# Patient Record
Sex: Female | Born: 1945 | Race: White | Hispanic: No | Marital: Single
Health system: Southern US, Academic
[De-identification: ages and names within clinical notes are randomized; demographics above are authoritative.]

## PROBLEM LIST (undated history)

## (undated) ENCOUNTER — Ambulatory Visit: Payer: MEDICARE

## (undated) ENCOUNTER — Ambulatory Visit

## (undated) ENCOUNTER — Encounter

## (undated) ENCOUNTER — Inpatient Hospital Stay

## (undated) ENCOUNTER — Telehealth

## (undated) DIAGNOSIS — I2510 Atherosclerotic heart disease of native coronary artery without angina pectoris: Secondary | ICD-10-CM

## (undated) DIAGNOSIS — M51369 Other intervertebral disc degeneration, lumbar region without mention of lumbar back pain or lower extremity pain: Secondary | ICD-10-CM

## (undated) DIAGNOSIS — E669 Obesity, unspecified: Secondary | ICD-10-CM

## (undated) DIAGNOSIS — Z5189 Encounter for other specified aftercare: Secondary | ICD-10-CM

## (undated) DIAGNOSIS — M1990 Unspecified osteoarthritis, unspecified site: Secondary | ICD-10-CM

## (undated) DIAGNOSIS — F32A Depression, unspecified: Secondary | ICD-10-CM

## (undated) DIAGNOSIS — M5136 Other intervertebral disc degeneration, lumbar region: Secondary | ICD-10-CM

## (undated) DIAGNOSIS — I509 Heart failure, unspecified: Secondary | ICD-10-CM

## (undated) DIAGNOSIS — N159 Renal tubulo-interstitial disease, unspecified: Secondary | ICD-10-CM

## (undated) DIAGNOSIS — J449 Chronic obstructive pulmonary disease, unspecified: Secondary | ICD-10-CM

## (undated) DIAGNOSIS — R0902 Hypoxemia: Secondary | ICD-10-CM

## (undated) DIAGNOSIS — R0600 Dyspnea, unspecified: Secondary | ICD-10-CM

## (undated) DIAGNOSIS — E119 Type 2 diabetes mellitus without complications: Secondary | ICD-10-CM

## (undated) DIAGNOSIS — I499 Cardiac arrhythmia, unspecified: Secondary | ICD-10-CM

## (undated) DIAGNOSIS — R609 Edema, unspecified: Secondary | ICD-10-CM

## (undated) DIAGNOSIS — G4730 Sleep apnea, unspecified: Secondary | ICD-10-CM

## (undated) DIAGNOSIS — Z9981 Dependence on supplemental oxygen: Secondary | ICD-10-CM

## (undated) DIAGNOSIS — F329 Major depressive disorder, single episode, unspecified: Secondary | ICD-10-CM

## (undated) DIAGNOSIS — J40 Bronchitis, not specified as acute or chronic: Secondary | ICD-10-CM

## (undated) DIAGNOSIS — C801 Malignant (primary) neoplasm, unspecified: Secondary | ICD-10-CM

## (undated) DIAGNOSIS — I10 Essential (primary) hypertension: Secondary | ICD-10-CM

## (undated) DIAGNOSIS — I209 Angina pectoris, unspecified: Secondary | ICD-10-CM

## (undated) DIAGNOSIS — B1920 Unspecified viral hepatitis C without hepatic coma: Secondary | ICD-10-CM

## (undated) DIAGNOSIS — N189 Chronic kidney disease, unspecified: Secondary | ICD-10-CM

## (undated) DIAGNOSIS — B029 Zoster without complications: Secondary | ICD-10-CM

## (undated) DIAGNOSIS — J189 Pneumonia, unspecified organism: Secondary | ICD-10-CM

## (undated) DIAGNOSIS — J45909 Unspecified asthma, uncomplicated: Secondary | ICD-10-CM

## (undated) DIAGNOSIS — D649 Anemia, unspecified: Secondary | ICD-10-CM

## (undated) DIAGNOSIS — J3089 Other allergic rhinitis: Secondary | ICD-10-CM

## (undated) HISTORY — PX: ABDOMINAL HYSTERECTOMY: SHX81

## (undated) HISTORY — DX: Chronic kidney disease, unspecified: N18.9

## (undated) HISTORY — DX: Unspecified viral hepatitis C without hepatic coma: B19.20

## (undated) HISTORY — PX: COLONOSCOPY: SHX174

## (undated) HISTORY — DX: Obesity, unspecified: E66.9

## (undated) HISTORY — DX: Heart failure, unspecified: I50.9

## (undated) HISTORY — DX: Essential (primary) hypertension: I10

## (undated) HISTORY — DX: Zoster without complications: B02.9

## (undated) HISTORY — DX: Renal tubulo-interstitial disease, unspecified: N15.9

## (undated) HISTORY — DX: Unspecified asthma, uncomplicated: J45.909

## (undated) HISTORY — DX: Anemia, unspecified: D64.9

## (undated) HISTORY — DX: Type 2 diabetes mellitus without complications: E11.9

## (undated) HISTORY — DX: Unspecified osteoarthritis, unspecified site: M19.90

## (undated) HISTORY — PX: OTHER SURGICAL HISTORY: SHX169

## (undated) HISTORY — DX: Sleep apnea, unspecified: G47.30

## (undated) HISTORY — DX: Chronic obstructive pulmonary disease, unspecified: J44.9

## (undated) HISTORY — DX: Hypoxemia: R09.02

## (undated) HISTORY — DX: Encounter for other specified aftercare: Z51.89

---
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
## 2005-08-02 HISTORY — PX: CORONARY ANGIOPLASTY WITH STENT PLACEMENT: SHX49

## 2007-08-12 ENCOUNTER — Emergency Department: Payer: Self-pay | Admitting: Internal Medicine

## 2007-08-12 ENCOUNTER — Other Ambulatory Visit: Payer: Self-pay

## 2007-09-08 ENCOUNTER — Ambulatory Visit: Payer: Self-pay | Admitting: Family Medicine

## 2008-01-21 ENCOUNTER — Ambulatory Visit: Payer: Self-pay | Admitting: Sports Medicine

## 2011-04-06 ENCOUNTER — Encounter: Payer: Self-pay | Admitting: *Deleted

## 2011-05-03 ENCOUNTER — Encounter: Payer: Self-pay | Admitting: *Deleted

## 2011-05-25 ENCOUNTER — Ambulatory Visit: Payer: Self-pay | Admitting: Family Medicine

## 2011-06-03 ENCOUNTER — Encounter: Payer: Self-pay | Admitting: *Deleted

## 2011-07-03 ENCOUNTER — Encounter: Payer: Self-pay | Admitting: *Deleted

## 2011-11-17 ENCOUNTER — Ambulatory Visit: Payer: Self-pay | Admitting: *Deleted

## 2012-06-04 ENCOUNTER — Ambulatory Visit: Payer: Self-pay | Admitting: Physician Assistant

## 2012-06-14 ENCOUNTER — Ambulatory Visit: Payer: Self-pay | Admitting: Family Medicine

## 2012-10-28 ENCOUNTER — Inpatient Hospital Stay: Payer: Self-pay | Admitting: Surgery

## 2012-10-28 LAB — CBC
HCT: 29.4 % — ABNORMAL LOW (ref 35.0–47.0)
HGB: 9.5 g/dL — ABNORMAL LOW (ref 12.0–16.0)
MCH: 24.1 pg — ABNORMAL LOW (ref 26.0–34.0)
MCV: 75 fL — ABNORMAL LOW (ref 80–100)
RBC: 3.94 10*6/uL (ref 3.80–5.20)
RDW: 17.6 % — ABNORMAL HIGH (ref 11.5–14.5)
WBC: 7 10*3/uL (ref 3.6–11.0)

## 2012-10-28 LAB — URINALYSIS, COMPLETE
Bilirubin,UR: NEGATIVE
Blood: NEGATIVE
Glucose,UR: NEGATIVE mg/dL (ref 0–75)
Ketone: NEGATIVE
Nitrite: NEGATIVE
Ph: 5 (ref 4.5–8.0)
RBC,UR: 4 /HPF (ref 0–5)
Specific Gravity: 1.033 (ref 1.003–1.030)
WBC UR: 10 /HPF (ref 0–5)

## 2012-10-28 LAB — COMPREHENSIVE METABOLIC PANEL
Albumin: 3.6 g/dL (ref 3.4–5.0)
Alkaline Phosphatase: 74 U/L (ref 50–136)
BUN: 34 mg/dL — ABNORMAL HIGH (ref 7–18)
Bilirubin,Total: 0.7 mg/dL (ref 0.2–1.0)
Chloride: 98 mmol/L (ref 98–107)
Co2: 25 mmol/L (ref 21–32)
Creatinine: 1.3 mg/dL (ref 0.60–1.30)
EGFR (Non-African Amer.): 43 — ABNORMAL LOW
Osmolality: 274 (ref 275–301)
Sodium: 131 mmol/L — ABNORMAL LOW (ref 136–145)

## 2012-10-28 LAB — PROTIME-INR: Prothrombin Time: 14.7 secs (ref 11.5–14.7)

## 2012-10-29 LAB — COMPREHENSIVE METABOLIC PANEL
Albumin: 3.2 g/dL — ABNORMAL LOW (ref 3.4–5.0)
Anion Gap: 5 — ABNORMAL LOW (ref 7–16)
BUN: 34 mg/dL — ABNORMAL HIGH (ref 7–18)
Calcium, Total: 9 mg/dL (ref 8.5–10.1)
Chloride: 98 mmol/L (ref 98–107)
Creatinine: 1.25 mg/dL (ref 0.60–1.30)
EGFR (Non-African Amer.): 45 — ABNORMAL LOW
Glucose: 233 mg/dL — ABNORMAL HIGH (ref 65–99)
Osmolality: 281 (ref 275–301)
Potassium: 4.5 mmol/L (ref 3.5–5.1)
Total Protein: 8.2 g/dL (ref 6.4–8.2)

## 2012-10-29 LAB — CBC WITH DIFFERENTIAL/PLATELET
Basophil %: 0.7 %
Eosinophil #: 0.1 10*3/uL (ref 0.0–0.7)
Eosinophil %: 1.3 %
HCT: 25.2 % — ABNORMAL LOW (ref 35.0–47.0)
HGB: 8.4 g/dL — ABNORMAL LOW (ref 12.0–16.0)
Lymphocyte #: 0.6 10*3/uL — ABNORMAL LOW (ref 1.0–3.6)
Lymphocyte %: 10.7 %
MCH: 25.9 pg — ABNORMAL LOW (ref 26.0–34.0)
MCHC: 33.4 g/dL (ref 32.0–36.0)
Neutrophil %: 82.2 %
RBC: 3.25 10*6/uL — ABNORMAL LOW (ref 3.80–5.20)

## 2012-10-30 LAB — CBC WITH DIFFERENTIAL/PLATELET
Basophil %: 1.1 %
Eosinophil %: 5.6 %
HCT: 25.2 % — ABNORMAL LOW (ref 35.0–47.0)
HGB: 8.4 g/dL — ABNORMAL LOW (ref 12.0–16.0)
Lymphocyte #: 0.8 10*3/uL — ABNORMAL LOW (ref 1.0–3.6)
MCHC: 33.3 g/dL (ref 32.0–36.0)
MCV: 75 fL — ABNORMAL LOW (ref 80–100)
Monocyte %: 7.2 %
Neutrophil #: 3.4 10*3/uL (ref 1.4–6.5)
Platelet: 93 10*3/uL — ABNORMAL LOW (ref 150–440)
RBC: 3.37 10*6/uL — ABNORMAL LOW (ref 3.80–5.20)
WBC: 4.8 10*3/uL (ref 3.6–11.0)

## 2012-10-30 LAB — BASIC METABOLIC PANEL
Anion Gap: 6 — ABNORMAL LOW (ref 7–16)
BUN: 32 mg/dL — ABNORMAL HIGH (ref 7–18)
Calcium, Total: 9.1 mg/dL (ref 8.5–10.1)
Co2: 29 mmol/L (ref 21–32)
Potassium: 4.7 mmol/L (ref 3.5–5.1)

## 2012-10-31 LAB — IRON AND TIBC: Iron Saturation: 9 %

## 2012-10-31 LAB — CBC WITH DIFFERENTIAL/PLATELET
Basophil #: 0 10*3/uL (ref 0.0–0.1)
Eosinophil #: 0.4 10*3/uL (ref 0.0–0.7)
HGB: 7.7 g/dL — ABNORMAL LOW (ref 12.0–16.0)
Lymphocyte #: 0.6 10*3/uL — ABNORMAL LOW (ref 1.0–3.6)
Lymphocyte %: 15.1 %
MCH: 24.4 pg — ABNORMAL LOW (ref 26.0–34.0)
Neutrophil %: 69.1 %
Platelet: 84 10*3/uL — ABNORMAL LOW (ref 150–440)
RBC: 3.16 10*6/uL — ABNORMAL LOW (ref 3.80–5.20)
RDW: 17.3 % — ABNORMAL HIGH (ref 11.5–14.5)
WBC: 4 10*3/uL (ref 3.6–11.0)

## 2012-10-31 LAB — BASIC METABOLIC PANEL
BUN: 25 mg/dL — ABNORMAL HIGH (ref 7–18)
Calcium, Total: 9 mg/dL (ref 8.5–10.1)
Co2: 31 mmol/L (ref 21–32)
Creatinine: 1.21 mg/dL (ref 0.60–1.30)
Potassium: 4.7 mmol/L (ref 3.5–5.1)
Sodium: 132 mmol/L — ABNORMAL LOW (ref 136–145)

## 2012-11-01 LAB — CBC WITH DIFFERENTIAL/PLATELET
Basophil %: 0.5 %
Eosinophil #: 0.3 10*3/uL (ref 0.0–0.7)
HGB: 7.6 g/dL — ABNORMAL LOW (ref 12.0–16.0)
Lymphocyte #: 0.5 10*3/uL — ABNORMAL LOW (ref 1.0–3.6)
Lymphocyte %: 11.4 %
MCH: 23.9 pg — ABNORMAL LOW (ref 26.0–34.0)
MCHC: 31.6 g/dL — ABNORMAL LOW (ref 32.0–36.0)
MCV: 76 fL — ABNORMAL LOW (ref 80–100)
Monocyte %: 5.9 %
Neutrophil %: 74.5 %
Platelet: 100 10*3/uL — ABNORMAL LOW (ref 150–440)
WBC: 4.3 10*3/uL (ref 3.6–11.0)

## 2012-11-01 LAB — BASIC METABOLIC PANEL
Anion Gap: 6 — ABNORMAL LOW (ref 7–16)
BUN: 23 mg/dL — ABNORMAL HIGH (ref 7–18)
Calcium, Total: 8.8 mg/dL (ref 8.5–10.1)
Co2: 29 mmol/L (ref 21–32)
Creatinine: 1.18 mg/dL (ref 0.60–1.30)
EGFR (Non-African Amer.): 49 — ABNORMAL LOW
Osmolality: 281 (ref 275–301)
Potassium: 4.5 mmol/L (ref 3.5–5.1)

## 2012-11-02 LAB — BASIC METABOLIC PANEL
Anion Gap: 6 — ABNORMAL LOW (ref 7–16)
BUN: 31 mg/dL — ABNORMAL HIGH (ref 7–18)
Co2: 30 mmol/L (ref 21–32)
EGFR (African American): 48 — ABNORMAL LOW
Glucose: 190 mg/dL — ABNORMAL HIGH (ref 65–99)
Potassium: 4.6 mmol/L (ref 3.5–5.1)
Sodium: 134 mmol/L — ABNORMAL LOW (ref 136–145)

## 2012-11-02 LAB — CBC WITH DIFFERENTIAL/PLATELET
Basophil %: 0.8 %
HCT: 23.8 % — ABNORMAL LOW (ref 35.0–47.0)
HGB: 7.5 g/dL — ABNORMAL LOW (ref 12.0–16.0)
Lymphocyte #: 0.6 10*3/uL — ABNORMAL LOW (ref 1.0–3.6)
MCH: 24 pg — ABNORMAL LOW (ref 26.0–34.0)
MCHC: 31.8 g/dL — ABNORMAL LOW (ref 32.0–36.0)
Monocyte #: 0.3 x10 3/mm (ref 0.2–0.9)
Neutrophil #: 2.6 10*3/uL (ref 1.4–6.5)
Platelet: 106 10*3/uL — ABNORMAL LOW (ref 150–440)
RDW: 17 % — ABNORMAL HIGH (ref 11.5–14.5)
WBC: 3.8 10*3/uL (ref 3.6–11.0)

## 2012-11-04 LAB — BASIC METABOLIC PANEL
Anion Gap: 4 — ABNORMAL LOW (ref 7–16)
BUN: 34 mg/dL — ABNORMAL HIGH (ref 7–18)
Calcium, Total: 9.1 mg/dL (ref 8.5–10.1)
Chloride: 99 mmol/L (ref 98–107)
Creatinine: 1.24 mg/dL (ref 0.60–1.30)
EGFR (African American): 53 — ABNORMAL LOW
Glucose: 115 mg/dL — ABNORMAL HIGH (ref 65–99)
Osmolality: 277 (ref 275–301)
Potassium: 4.4 mmol/L (ref 3.5–5.1)
Sodium: 134 mmol/L — ABNORMAL LOW (ref 136–145)

## 2012-11-05 LAB — CBC WITH DIFFERENTIAL/PLATELET
Basophil %: 0.9 %
Eosinophil #: 0.3 10*3/uL (ref 0.0–0.7)
Eosinophil %: 8.6 %
HGB: 7.9 g/dL — ABNORMAL LOW (ref 12.0–16.0)
Lymphocyte #: 0.6 10*3/uL — ABNORMAL LOW (ref 1.0–3.6)
Lymphocyte %: 15.6 %
MCHC: 32.1 g/dL (ref 32.0–36.0)
MCV: 76 fL — ABNORMAL LOW (ref 80–100)
Monocyte #: 0.3 x10 3/mm (ref 0.2–0.9)
Neutrophil %: 67.2 %
Platelet: 116 10*3/uL — ABNORMAL LOW (ref 150–440)
RBC: 3.26 10*6/uL — ABNORMAL LOW (ref 3.80–5.20)
RDW: 17.9 % — ABNORMAL HIGH (ref 11.5–14.5)

## 2012-11-05 LAB — BASIC METABOLIC PANEL
BUN: 32 mg/dL — ABNORMAL HIGH (ref 7–18)
Calcium, Total: 9 mg/dL (ref 8.5–10.1)
Co2: 32 mmol/L (ref 21–32)
Creatinine: 1.3 mg/dL (ref 0.60–1.30)
EGFR (African American): 50 — ABNORMAL LOW
EGFR (Non-African Amer.): 43 — ABNORMAL LOW
Osmolality: 279 (ref 275–301)
Sodium: 137 mmol/L (ref 136–145)

## 2012-11-07 LAB — CBC WITH DIFFERENTIAL/PLATELET
Basophil #: 0 10*3/uL (ref 0.0–0.1)
Basophil %: 1.1 %
Eosinophil #: 0.3 10*3/uL (ref 0.0–0.7)
Eosinophil %: 8.4 %
HCT: 24.5 % — ABNORMAL LOW (ref 35.0–47.0)
HGB: 8 g/dL — ABNORMAL LOW (ref 12.0–16.0)
Lymphocyte #: 0.5 10*3/uL — ABNORMAL LOW (ref 1.0–3.6)
Lymphocyte %: 16.1 %
MCH: 24.7 pg — ABNORMAL LOW (ref 26.0–34.0)
MCHC: 32.7 g/dL (ref 32.0–36.0)
MCV: 76 fL — ABNORMAL LOW (ref 80–100)
Monocyte #: 0.2 x10 3/mm (ref 0.2–0.9)
Monocyte %: 6.8 %
Neutrophil #: 2.3 10*3/uL (ref 1.4–6.5)
Neutrophil %: 67.6 %
Platelet: 128 10*3/uL — ABNORMAL LOW (ref 150–440)
RBC: 3.23 10*6/uL — ABNORMAL LOW (ref 3.80–5.20)
RDW: 17.7 % — ABNORMAL HIGH (ref 11.5–14.5)
WBC: 3.4 10*3/uL — ABNORMAL LOW (ref 3.6–11.0)

## 2012-11-08 LAB — CBC WITH DIFFERENTIAL/PLATELET
Basophil #: 0 10*3/uL (ref 0.0–0.1)
Basophil %: 0.5 %
Eosinophil #: 0.1 10*3/uL (ref 0.0–0.7)
HCT: 25.4 % — ABNORMAL LOW (ref 35.0–47.0)
HGB: 8.3 g/dL — ABNORMAL LOW (ref 12.0–16.0)
Lymphocyte %: 9.5 %
MCH: 25 pg — ABNORMAL LOW (ref 26.0–34.0)
MCHC: 32.9 g/dL (ref 32.0–36.0)
MCV: 76 fL — ABNORMAL LOW (ref 80–100)
Monocyte #: 0.3 x10 3/mm (ref 0.2–0.9)
Neutrophil #: 4.7 10*3/uL (ref 1.4–6.5)
Neutrophil %: 83.4 %
RBC: 3.34 10*6/uL — ABNORMAL LOW (ref 3.80–5.20)
RDW: 17.7 % — ABNORMAL HIGH (ref 11.5–14.5)
WBC: 5.6 10*3/uL (ref 3.6–11.0)

## 2013-01-16 ENCOUNTER — Encounter: Payer: Self-pay | Admitting: Specialist

## 2013-01-30 ENCOUNTER — Encounter: Payer: Self-pay | Admitting: Specialist

## 2013-06-19 ENCOUNTER — Ambulatory Visit: Payer: Self-pay | Admitting: Family Medicine

## 2013-07-28 ENCOUNTER — Ambulatory Visit: Payer: Self-pay | Admitting: Family Medicine

## 2013-07-28 LAB — RAPID STREP-A WITH REFLX: Micro Text Report: NEGATIVE

## 2013-07-28 LAB — RAPID INFLUENZA A&B ANTIGENS

## 2014-06-26 ENCOUNTER — Ambulatory Visit: Payer: Self-pay | Admitting: Family Medicine
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
## 2015-03-25 ENCOUNTER — Ambulatory Visit: Payer: Self-pay

## 2015-08-20 ENCOUNTER — Other Ambulatory Visit: Payer: Self-pay | Admitting: Family Medicine

## 2015-08-20 DIAGNOSIS — Z1231 Encounter for screening mammogram for malignant neoplasm of breast: Secondary | ICD-10-CM

## 2015-08-21 ENCOUNTER — Ambulatory Visit
Admission: RE | Admit: 2015-08-21 | Discharge: 2015-08-21 | Disposition: A | Discharge status: Home / Self Care | Payer: Medicare Other | Source: Ambulatory Visit | Attending: Family Medicine | Admitting: Family Medicine

## 2015-08-21 DIAGNOSIS — Z1231 Encounter for screening mammogram for malignant neoplasm of breast: Secondary | ICD-10-CM | POA: Insufficient documentation

## 2015-08-21 HISTORY — DX: Malignant (primary) neoplasm, unspecified: C80.1

## 2015-12-01 ENCOUNTER — Ambulatory Visit: Payer: Medicare Other | Admitting: Anesthesiology

## 2015-12-02 ENCOUNTER — Encounter: Payer: Self-pay | Admitting: Anesthesiology

## 2015-12-02 ENCOUNTER — Ambulatory Visit: Payer: Medicare Other | Attending: Anesthesiology | Admitting: Anesthesiology

## 2015-12-02 VITALS — BP 118/62 | HR 78 | Temp 98.2°F | Resp 16 | Ht 66.5 in | Wt 290.0 lb

## 2015-12-02 DIAGNOSIS — M5386 Other specified dorsopathies, lumbar region: Secondary | ICD-10-CM

## 2015-12-02 DIAGNOSIS — I509 Heart failure, unspecified: Secondary | ICD-10-CM | POA: Diagnosis not present

## 2015-12-02 DIAGNOSIS — E669 Obesity, unspecified: Secondary | ICD-10-CM | POA: Diagnosis not present

## 2015-12-02 DIAGNOSIS — M542 Cervicalgia: Secondary | ICD-10-CM | POA: Diagnosis not present

## 2015-12-02 DIAGNOSIS — M5136 Other intervertebral disc degeneration, lumbar region: Secondary | ICD-10-CM | POA: Insufficient documentation

## 2015-12-02 DIAGNOSIS — M79606 Pain in leg, unspecified: Secondary | ICD-10-CM | POA: Diagnosis present

## 2015-12-02 DIAGNOSIS — D509 Iron deficiency anemia, unspecified: Secondary | ICD-10-CM | POA: Diagnosis not present

## 2015-12-02 DIAGNOSIS — M549 Dorsalgia, unspecified: Secondary | ICD-10-CM | POA: Diagnosis present

## 2015-12-02 DIAGNOSIS — B1920 Unspecified viral hepatitis C without hepatic coma: Secondary | ICD-10-CM | POA: Insufficient documentation

## 2015-12-02 DIAGNOSIS — G4730 Sleep apnea, unspecified: Secondary | ICD-10-CM | POA: Insufficient documentation

## 2015-12-02 DIAGNOSIS — M5030 Other cervical disc degeneration, unspecified cervical region: Secondary | ICD-10-CM | POA: Diagnosis not present

## 2015-12-02 DIAGNOSIS — D696 Thrombocytopenia, unspecified: Secondary | ICD-10-CM | POA: Diagnosis not present

## 2015-12-02 DIAGNOSIS — E119 Type 2 diabetes mellitus without complications: Secondary | ICD-10-CM | POA: Diagnosis not present

## 2015-12-02 DIAGNOSIS — Z955 Presence of coronary angioplasty implant and graft: Secondary | ICD-10-CM | POA: Insufficient documentation

## 2015-12-02 DIAGNOSIS — J45909 Unspecified asthma, uncomplicated: Secondary | ICD-10-CM | POA: Insufficient documentation

## 2015-12-02 DIAGNOSIS — Z87891 Personal history of nicotine dependence: Secondary | ICD-10-CM | POA: Insufficient documentation

## 2015-12-02 DIAGNOSIS — M47816 Spondylosis without myelopathy or radiculopathy, lumbar region: Secondary | ICD-10-CM | POA: Diagnosis not present

## 2015-12-02 DIAGNOSIS — M545 Low back pain: Secondary | ICD-10-CM | POA: Diagnosis not present

## 2015-12-02 DIAGNOSIS — I129 Hypertensive chronic kidney disease with stage 1 through stage 4 chronic kidney disease, or unspecified chronic kidney disease: Secondary | ICD-10-CM | POA: Diagnosis not present

## 2015-12-02 DIAGNOSIS — M25511 Pain in right shoulder: Secondary | ICD-10-CM | POA: Diagnosis not present

## 2015-12-02 DIAGNOSIS — J449 Chronic obstructive pulmonary disease, unspecified: Secondary | ICD-10-CM | POA: Insufficient documentation

## 2015-12-02 NOTE — Progress Notes (Signed)
Safety precautions to be maintained throughout the outpatient stay will include: orient to surroundings, keep bed in low position, maintain call bell within reach at all times, provide assistance with transfer out of bed and ambulation.  

## 2015-12-02 NOTE — Progress Notes (Signed)
Subjective:  Patient ID: Whitney Cline, female    DOB: November 29, 1945  Age: 70 y.o. MRN: [id number]  CC: Back Pain; Leg Pain; and Neck Pain   HPI Whitney Cline presents for A new patient evaluation. She is referred by her physician Dr. Iona Beard at [facility] for evaluation and management of her low back pain neck pain and right shoulder pain. She's had these complaints for several months and describes them as an aching and annoying dull often disabling pain that does not appear to be influenced by time of day. She has received opioids for management in the past by the pain control Center at [facility] and these were beneficial. Furthermore she had epidural steroid injections for her low back pain but these were ineffective and caused a significant transient increase in her blood sugar which at one point required hospitalization to get her blood sugar under control for 1 week. She is referred by her primary care team to the pain control Center for management of her opioid medications and our assistance. She states that she has had previous x-rays and evaluations in the past. X line  Cervical CT reveals evidence of multilevel degenerative disc disease and facet arthropathy Lumbar MRI: None on record   History Whitney Cline has a past medical history of Cancer [facility]); COPD (chronic obstructive pulmonary disease) ([place]); CHF (congestive heart failure) ([place]); Asthma; Oxygen deficiency; Diabetes mellitus without complication ([place]); Hypertension; Chronic kidney disease; Sleep apnea; Hepatitis C; Obesity; and Arthritis.   She has past surgical history that includes Abdominal hysterectomy and Coronary angioplasty with stent (N/A, 2007).   Her family history includes Diabetes in her father; Heart disease in her brother and mother.She reports that she quit smoking about 4 years ago. Her smoking use included Cigarettes. She has never used smokeless tobacco. She reports that she does not use illicit drugs. Her alcohol history  is not on file.   ---------------------------------------------------------------------------------------------------------------------- Past Medical History  Diagnosis Date  . Cancer [facility])     liver ca- had ablation cancer free now  . COPD (chronic obstructive pulmonary disease) ([place])   . CHF (congestive heart failure) ([place])   . Asthma   . Oxygen deficiency   . Diabetes mellitus without complication ([place])   . Hypertension   . Chronic kidney disease   . Sleep apnea   . Hepatitis C   . Obesity   . Arthritis     Past Surgical History  Procedure Laterality Date  . Abdominal hysterectomy    . Coronary angioplasty with stent placement N/A 2007    Family History  Problem Relation Age of Onset  . Heart disease Mother   . Diabetes Father   . Heart disease Brother     Social History  Substance Use Topics  . Smoking status: Former Smoker    Types: Cigarettes    Quit date: 06/22/2011  . Smokeless tobacco: Never Used  . Alcohol Use: Not on file    ---------------------------------------------------------------------------------------------------------------------- Social History   Social History  . Marital Status: Single    Spouse Name: N/A  . Number of Children: N/A  . Years of Education: N/A   Social History Main Topics  . Smoking status: Former Smoker    Types: Cigarettes    Quit date: 06/22/2011  . Smokeless tobacco: Never Used  . Alcohol Use: None  . Drug Use: No  . Sexual Activity: Not Asked   Other Topics Concern  . 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 She has difficulty ambulating and uses a walker with nasal cannula O2 in place.    Assessment & Plan:   Whitney Cline was seen today for back pain, leg pain and neck pain.  Diagnoses and all orders for this visit:  DDD (degenerative disc disease), lumbar -     ToxASSURE Select 13 (MW), Urine  Facet arthritis of lumbar region -     ToxASSURE Select 13 (MW), Urine  Anemia, iron deficiency -     ToxASSURE Select 13 (MW), Urine  Cervicalgia -     ToxASSURE Select 13 (MW), Urine  Low back derangement syndrome -     ToxASSURE Select 13 (MW), Urine     ----------------------------------------------------------------------------------------------------------------------  Problem List Items Addressed This Visit    None    Visit Diagnoses    DDD (degenerative disc disease), lumbar    -  Primary    Relevant Medications    oxycodone (OXY-IR) 5 MG capsule    Other Relevant Orders    ToxASSURE Select 13 (MW), Urine    Facet arthritis of lumbar region        Relevant Medications    oxycodone (OXY-IR) 5 MG capsule    Other Relevant Orders    ToxASSURE Select 13 (MW), Urine    Anemia, iron deficiency        Relevant Medications    ferrous sulfate 325 (65 FE) MG tablet    Other Relevant Orders    ToxASSURE Select 13 (MW), Urine    Cervicalgia        Relevant Orders    ToxASSURE Select 13 (MW), Urine    Low back derangement syndrome        Relevant Medications    oxycodone (OXY-IR) 5 MG capsule     Other Relevant Orders    ToxASSURE Select 13 (MW), Urine       ----------------------------------------------------------------------------------------------------------------------  1. DDD (degenerative disc disease), lumbar  - ToxASSURE Select 13 (MW), Urine  2. Facet arthritis of lumbar region  - ToxASSURE Select 13 (MW), Urine  3. Anemia, iron deficiency  - ToxASSURE Select 13 (MW), Urine  4. Cervicalgia  - ToxASSURE Select 13 (MW), Urine  5. Low back derangement syndrome Unfortunately she is not a candidate for repeat epidural steroid injections and has shown no response to previous interventional therapy. She is also a high risk candidate with her history of thrombocytopenia and severe COPD. She has gained significant improvement with opioid management and I do feel that under the circumstances, being that she has failed conservative therapy, she is a candidate for continued chronic opioid management for her severe pain. We will plan on a toxic shear test today and start her on therapy in the next few  weeks on return visit. - ToxASSURE Select 13 (MW), Urine    ----------------------------------------------------------------------------------------------------------------------  I am having Ms. Whitney Cline maintain her albuterol, atorvastatin, budesonide-formoterol, carvedilol, cetirizine, cholecalciferol, colestipol, ferrous sulfate, furosemide, gabapentin, metFORMIN, naloxone, oxycodone, quinapril, Sofosbuvir-Velpatasvir, spironolactone, tiotropium, and Liraglutide.   Meds ordered this encounter  Medications  . albuterol (PROVENTIL HFA;VENTOLIN HFA) 108 (90 Base) MCG/ACT inhaler    Sig: Inhale 2 puffs into the lungs every 6 (six) hours as needed for wheezing or shortness of breath.  Marland Kitchen atorvastatin (LIPITOR) 80 MG tablet    Sig: Take 80 mg by mouth daily.  . budesonide-formoterol (SYMBICORT) 160-4.5 MCG/ACT inhaler    Sig: Inhale 2 puffs into the lungs 2 (two) times daily.   . carvedilol (COREG) 3.125 MG tablet    Sig: Take 3.125 mg by mouth 2 (two) times daily with a meal.  . cetirizine (ZYRTEC) 10 MG tablet    Sig: Take 10 mg by mouth daily.  . cholecalciferol (VITAMIN D) 400 units TABS tablet    Sig: Take 400 Units by mouth.  . colestipol (COLESTID) 1 g tablet    Sig: Take 1 g by mouth.  . ferrous sulfate 325 (65 FE) MG tablet    Sig: Take 325 mg by mouth daily with breakfast.  . furosemide (LASIX) 20 MG tablet    Sig: Take 20 mg by mouth daily.  Marland Kitchen gabapentin (NEURONTIN) 300 MG capsule    Sig: Take 300 mg by mouth daily at 12 noon.  . metFORMIN (GLUCOPHAGE) 500 MG tablet    Sig: Take 1,000 mg by mouth 2 (two) times daily with a meal.  . naloxone (NARCAN) 0.4 MG/ML injection    Sig: Inject 0.4 mg into the vein as needed.  Marland Kitchen oxycodone (OXY-IR) 5 MG capsule    Sig: Take 5 mg by mouth 2 (two) times daily.  . quinapril (ACCUPRIL) 20 MG tablet    Sig: Take 20 mg by mouth at bedtime.  . Sofosbuvir-Velpatasvir 400-100 MG TABS    Sig: Take by mouth.  . spironolactone (ALDACTONE) 25 MG tablet    Sig: Take 25 mg by mouth daily.  Marland Kitchen tiotropium (SPIRIVA) 18 MCG inhalation capsule    Sig: Place 18 mcg into inhaler and inhale daily.  . Liraglutide (VICTOZA) 18 MG/3ML SOPN    Sig: Inject into the skin daily at 12 noon.       Follow-up: Return in about 3 weeks (around 12/23/2015) for evaluation, med refill.    Molli Barrows, MD

## 2015-12-04 ENCOUNTER — Other Ambulatory Visit: Payer: Self-pay

## 2015-12-09 LAB — TOXASSURE SELECT 13 (MW), URINE

## 2015-12-22 ENCOUNTER — Ambulatory Visit: Payer: Medicare Other | Admitting: Anesthesiology

## 2015-12-30 ENCOUNTER — Other Ambulatory Visit: Payer: Self-pay | Admitting: Family Medicine

## 2015-12-30 DIAGNOSIS — Z780 Asymptomatic menopausal state: Secondary | ICD-10-CM

## 2016-01-27 ENCOUNTER — Encounter: Payer: Self-pay | Admitting: Anesthesiology

## 2016-01-27 ENCOUNTER — Ambulatory Visit: Payer: Medicare Other | Attending: Anesthesiology | Admitting: Anesthesiology

## 2016-01-27 VITALS — BP 147/59 | HR 81 | Temp 97.7°F | Resp 16 | Ht 66.0 in | Wt 288.0 lb

## 2016-01-27 DIAGNOSIS — M5386 Other specified dorsopathies, lumbar region: Secondary | ICD-10-CM

## 2016-01-27 DIAGNOSIS — D509 Iron deficiency anemia, unspecified: Secondary | ICD-10-CM | POA: Diagnosis not present

## 2016-01-27 DIAGNOSIS — M542 Cervicalgia: Secondary | ICD-10-CM | POA: Diagnosis not present

## 2016-01-27 DIAGNOSIS — M5136 Other intervertebral disc degeneration, lumbar region: Secondary | ICD-10-CM

## 2016-01-27 DIAGNOSIS — M47816 Spondylosis without myelopathy or radiculopathy, lumbar region: Secondary | ICD-10-CM | POA: Diagnosis not present

## 2016-01-27 DIAGNOSIS — M545 Low back pain: Secondary | ICD-10-CM

## 2016-01-27 MED ORDER — METHOCARBAMOL 750 MG PO TABS: 750.0000 mg | ORAL_TABLET | Freq: Two times a day (BID) | ORAL | Status: DC

## 2016-01-27 NOTE — Progress Notes (Signed)
   Subjective:    Patient ID: Whitney Cline, female    DOB: Jan 27, 1946, 70 y.o.   MRN: [id number]  HPI    Review of Systems     Objective:   Physical Exam        Assessment & Plan:  Safety precautions to be maintained throughout the outpatient stay will include: orient to surroundings, keep bed in low position, maintain call bell within reach at all times, provide assistance with transfer out of bed and ambulation.  Pt recently in Hospital- received 3 units of blood- does not take po iron but has iron transfusions a year ago

## 2016-01-28 NOTE — Progress Notes (Signed)
Subjective:  Patient ID: Whitney Cline, female    DOB: [birth year]  Age: 70 y.o. MRN: [id number]  CC: Back Pain   HPI Alyah Witherow presents for reevaluation Today. She is referred by her physician Dr. Iona Beard at [facility] for evaluation and management of her low back pain neck pain and right shoulder pain. She's had these complaints for several months and describes them as an aching and annoying dull often disabling pain that does not appear to be influenced by time of day. She has received opioids for management in the past by the pain control Center at [facility] and these were beneficial. Furthermore she had epidural steroid injections for her low back pain but these were ineffective and caused a significant transient increase in her blood sugar which at one point required hospitalization to get her blood sugar under control for 1 week. She is referred by her primary care team to the pain control Center for management of her opioid medications and our assistance. She states that she has had previous x-rays and evaluations in the past.   She reports today for reevaluation. Her urine tox screen was appropriate and the quality characteristic and distribution of her pain are otherwise unchanged from her previous evaluation.  Cervical CT reveals evidence of multilevel degenerative disc disease and facet arthropathy Lumbar MRI: None on record   History Adileigh has a past medical history of Cancer ([place]); COPD (chronic obstructive pulmonary disease) ([place]); CHF (congestive heart failure) ([place]); Asthma; Oxygen deficiency; Diabetes mellitus without complication ([place]); Hypertension; Chronic kidney disease; Sleep apnea; Hepatitis C; Obesity; Arthritis; and Encounter for blood transfusion.   She has past surgical history that includes Abdominal hysterectomy and Coronary angioplasty with stent (N/A, 2007).   Her family history includes Diabetes in her father; Heart disease in her brother and mother.She  reports that she quit smoking about 4 years ago. Her smoking use included Cigarettes. She has never used smokeless tobacco. She reports that she does not use illicit drugs. Her alcohol history is not on file.   ---------------------------------------------------------------------------------------------------------------------- Past Medical History  Diagnosis Date  . Cancer [facility])     liver ca- had ablation cancer free now  . COPD (chronic obstructive pulmonary disease) ([place])   . CHF (congestive heart failure) ([place])   . Asthma   . Oxygen deficiency   . Diabetes mellitus without complication ([place])   . Hypertension   . Chronic kidney disease   . Sleep apnea   . Hepatitis C   . Obesity   . Arthritis   . Encounter for blood transfusion     Past Surgical History  Procedure Laterality Date  . Abdominal hysterectomy    . Coronary angioplasty with stent placement N/A 2007    Family History  Problem Relation Age of Onset  . Heart disease Mother   . Diabetes Father   . Heart disease Brother     Social History  Substance Use Topics  . Smoking status: Former Smoker    Types: Cigarettes    Quit date: 06/22/2011  . Smokeless tobacco: Never Used  . Alcohol Use: Not on file    ---------------------------------------------------------------------------------------------------------------------- Social History   Social History  . Marital Status: Single    Spouse Name: N/A  . Number of Children: N/A  . Years of Education: N/A   Social History Main Topics  . Smoking status: Former Smoker    Types: Cigarettes    Quit date: 06/22/2011  . Smokeless tobacco: Never Used  . Alcohol  Use: None  . Drug Use: No  . Sexual Activity: Not Asked   Other Topics Concern  . 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    Assessment & Plan:   There are no diagnoses linked to this encounter.   ----------------------------------------------------------------------------------------------------------------------  Problem List Items Addressed This Visit    None      ----------------------------------------------------------------------------------------------------------------------  1. DDD (degenerative disc disease), lumbar  Positive for oxycodone and metabolites as expected  2. Facet arthritis of lumbar region  Continue back stretching strengthening exercises as tolerated  3. Anemia, iron deficiency  -   4. Cervicalgia  - ToxASSURE Select 13 (MW), Urine  5. Low back derangement syndrome Unfortunately she is not a candidate for repeat epidural steroid injections and has shown no response to previous interventional therapy. She is also a high risk candidate with her history of thrombocytopenia and severe COPD. She has gained significant improvement with opioid management and I do feel that under the circumstances, being that she has failed conservative therapy, she is a candidate for continued chronic opioid management for her severe  pain.    ----------------------------------------------------------------------------------------------------------------------  I am having Ms. [place] start on methocarbamol. I am also having her maintain her albuterol, atorvastatin, budesonide-formoterol, carvedilol, cetirizine, cholecalciferol, colestipol, ferrous sulfate, furosemide, gabapentin, metFORMIN, naloxone, oxycodone, quinapril, Sofosbuvir-Velpatasvir, spironolactone, tiotropium, and Liraglutide.   Meds ordered this encounter  Medications  . methocarbamol (ROBAXIN) 750 MG tablet    Sig: Take 1 tablet (750 mg total) by mouth 2 (two) times daily.    Dispense:  60 tablet    Refill:  1       Follow-up: Return in about 6 weeks (around 03/09/2016) for evaluation, med refill.    Molli Barrows, MD

## 2016-03-04 ENCOUNTER — Ambulatory Visit: Payer: Medicare Other | Attending: Anesthesiology | Admitting: Anesthesiology

## 2016-03-04 ENCOUNTER — Encounter: Payer: Self-pay | Admitting: Anesthesiology

## 2016-03-04 VITALS — BP 126/56 | HR 79 | Temp 97.3°F | Resp 18 | Ht 66.0 in | Wt 280.0 lb

## 2016-03-04 DIAGNOSIS — M5136 Other intervertebral disc degeneration, lumbar region: Secondary | ICD-10-CM | POA: Diagnosis not present

## 2016-03-04 DIAGNOSIS — M545 Low back pain: Secondary | ICD-10-CM | POA: Insufficient documentation

## 2016-03-04 DIAGNOSIS — D696 Thrombocytopenia, unspecified: Secondary | ICD-10-CM | POA: Diagnosis not present

## 2016-03-04 DIAGNOSIS — Z6841 Body Mass Index (BMI) 40.0 and over, adult: Secondary | ICD-10-CM | POA: Diagnosis not present

## 2016-03-04 DIAGNOSIS — M47817 Spondylosis without myelopathy or radiculopathy, lumbosacral region: Secondary | ICD-10-CM | POA: Diagnosis not present

## 2016-03-04 DIAGNOSIS — N189 Chronic kidney disease, unspecified: Secondary | ICD-10-CM | POA: Insufficient documentation

## 2016-03-04 DIAGNOSIS — Z87891 Personal history of nicotine dependence: Secondary | ICD-10-CM | POA: Insufficient documentation

## 2016-03-04 DIAGNOSIS — Z955 Presence of coronary angioplasty implant and graft: Secondary | ICD-10-CM | POA: Insufficient documentation

## 2016-03-04 DIAGNOSIS — E1122 Type 2 diabetes mellitus with diabetic chronic kidney disease: Secondary | ICD-10-CM | POA: Diagnosis not present

## 2016-03-04 DIAGNOSIS — I129 Hypertensive chronic kidney disease with stage 1 through stage 4 chronic kidney disease, or unspecified chronic kidney disease: Secondary | ICD-10-CM | POA: Insufficient documentation

## 2016-03-04 DIAGNOSIS — E119 Type 2 diabetes mellitus without complications: Secondary | ICD-10-CM | POA: Insufficient documentation

## 2016-03-04 DIAGNOSIS — E669 Obesity, unspecified: Secondary | ICD-10-CM | POA: Insufficient documentation

## 2016-03-04 DIAGNOSIS — I130 Hypertensive heart and chronic kidney disease with heart failure and stage 1 through stage 4 chronic kidney disease, or unspecified chronic kidney disease: Secondary | ICD-10-CM | POA: Insufficient documentation

## 2016-03-04 DIAGNOSIS — Z8249 Family history of ischemic heart disease and other diseases of the circulatory system: Secondary | ICD-10-CM | POA: Insufficient documentation

## 2016-03-04 DIAGNOSIS — J45909 Unspecified asthma, uncomplicated: Secondary | ICD-10-CM | POA: Diagnosis not present

## 2016-03-04 DIAGNOSIS — I509 Heart failure, unspecified: Secondary | ICD-10-CM | POA: Diagnosis not present

## 2016-03-04 DIAGNOSIS — J449 Chronic obstructive pulmonary disease, unspecified: Secondary | ICD-10-CM | POA: Diagnosis not present

## 2016-03-04 DIAGNOSIS — D509 Iron deficiency anemia, unspecified: Secondary | ICD-10-CM | POA: Diagnosis not present

## 2016-03-04 DIAGNOSIS — M47816 Spondylosis without myelopathy or radiculopathy, lumbar region: Secondary | ICD-10-CM

## 2016-03-04 DIAGNOSIS — G4730 Sleep apnea, unspecified: Secondary | ICD-10-CM | POA: Diagnosis not present

## 2016-03-04 DIAGNOSIS — M542 Cervicalgia: Secondary | ICD-10-CM | POA: Diagnosis not present

## 2016-03-04 DIAGNOSIS — M5386 Other specified dorsopathies, lumbar region: Secondary | ICD-10-CM

## 2016-03-04 MED ORDER — METHOCARBAMOL 750 MG PO TABS: 750.0000 mg | ORAL_TABLET | Freq: Two times a day (BID) | ORAL | 5 refills | Status: DC

## 2016-03-04 MED ORDER — OXYCODONE HCL 5 MG PO CAPS: 5.0000 mg | ORAL_CAPSULE | Freq: Two times a day (BID) | ORAL | 0 refills | Status: DC

## 2016-03-04 NOTE — Progress Notes (Signed)
Safety precautions to be maintained throughout the outpatient stay will include: orient to surroundings, keep bed in low position, maintain call bell within reach at all times, provide assistance with transfer out of bed and ambulation.  

## 2016-03-04 NOTE — Progress Notes (Signed)
Subjective:  Patient ID: Whitney Cline, female    DOB: [birth year]  Age: 70 y.o. MRN: [id number]  CC: Back Pain (lower)   HPI Whitney Cline presents for reevaluation Today. She is referred by her physician Dr. Iona Beard at [facility] for evaluation and management of her low back pain neck pain and right shoulder pain. She's had these complaints for several months and describes them as an aching and annoying dull often disabling pain that does not appear to be influenced by time of day. She has received opioids for management in the past by the pain control Center at [facility] and these were beneficial. Furthermore she had epidural steroid injections for her low back pain but these were ineffective and caused a significant transient increase in her blood sugar which at one point required hospitalization to get her blood sugar under control for 1 week. She is referred by her primary care team to the pain control Center for management of her opioid medications and our assistance. She states that she has had previous x-rays and evaluations in the past.   She reports today for reevaluation. Her urine tox screen was appropriate and the quality characteristic and distribution of her pain are otherwise unchanged from her previous evaluation.No significant changes are otherwise noted and the quality characteristic and distribution of her pain a been stable in nature. She's taking her medications as prescribed with no untoward effects per our discussion today.  Cervical CT reveals evidence of multilevel degenerative disc disease and facet arthropathy Lumbar MRI: None on record   History Whitney Cline has a past medical history of Anemia; Arthritis; Asthma; Cancer ([place]); CHF (congestive heart failure) ([place]); Chronic kidney disease; COPD (chronic obstructive pulmonary disease) ([place]); Diabetes mellitus without complication ([place]); Encounter for blood transfusion; Hepatitis C; Hypertension; Obesity; Oxygen deficiency; and  Sleep apnea.   She has a past surgical history that includes Abdominal hysterectomy and Coronary angioplasty with stent (N/A, 2007).   Her family history includes Diabetes in her father; Heart disease in her brother and mother.She reports that she quit smoking about 4 years ago. Her smoking use included Cigarettes. She has never used smokeless tobacco. She reports that she does not use drugs. Her alcohol history is not on file.   ---------------------------------------------------------------------------------------------------------------------- Past Medical History:  Diagnosis Date  . Anemia   . Arthritis   . Asthma   . Cancer [facility])    liver ca- had ablation cancer free now  . CHF (congestive heart failure) ([place])   . Chronic kidney disease   . COPD (chronic obstructive pulmonary disease) ([place])   . Diabetes mellitus without complication ([place])   . Encounter for blood transfusion   . Hepatitis C   . Hypertension   . Obesity   . Oxygen deficiency   . Sleep apnea     Past Surgical History:  Procedure Laterality Date  . ABDOMINAL HYSTERECTOMY    . CORONARY ANGIOPLASTY WITH STENT PLACEMENT N/A 2007    Family History  Problem Relation Age of Onset  . Heart disease Mother   . Diabetes Father   . Heart disease Brother     Social History  Substance Use Topics  . Smoking status: Former Smoker    Types: Cigarettes    Quit date: 06/22/2011  . Smokeless tobacco: Never Used  . Alcohol use Not on file    ---------------------------------------------------------------------------------------------------------------------- Social History   Social History  . Marital status: Single    Spouse name: N/A  . Number of children: N/A  .  Years of education: N/A   Social History Main Topics  . Smoking status: Former Smoker    Types: Cigarettes    Quit date: 06/22/2011  . Smokeless tobacco: Never Used  . Alcohol use None  . Drug use: No  . Sexual activity: Not Asked   Other  Topics Concern  . None   Social History Narrative  . 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 Hematologic: Anemia thrombocytopenia Endocrine: Diabetes   Objective:  BP (!) 126/56 (BP Location: Left Arm, Patient Position: Sitting)   Pulse 79   Temp 97.3 F (36.3 C) (Oral)   Resp 18   Ht 5\' 6"  (1.676 m)   Wt 280 lb (127 kg)   SpO2 100% Comment: 3 L/[place]  BMI 45.19 kg/m   Physical Exam  Patient is alert and oriented 3 cooperative compliant a good historian Pupils are equally round reactive to light and EOMI Heart is regular rate and rhythm with distant heart sounds and no audible murmur Lungs are clear to also dictation with distant lung sounds no rhonchi or rales or wheezing noted Inspection low back reveals some paraspinous muscle tenderness.    Assessment & Plan:   Whitney Cline was seen today for back pain.  Diagnoses and all orders for this visit:  DDD (degenerative disc disease), lumbar  Facet arthritis of lumbar region  Cervicalgia  Low back derangement syndrome  Other orders -     oxycodone (OXY-IR) 5 MG capsule; Take 1 capsule (5 mg total) by mouth 2 (two) times daily. -     methocarbamol (ROBAXIN) 750 MG tablet; Take 1 tablet (750 mg total) by mouth 2 (two) times daily.     ----------------------------------------------------------------------------------------------------------------------  Problem List Items Addressed This Visit    None    Visit Diagnoses    DDD (degenerative disc disease), lumbar    -  Primary   Relevant Medications   oxycodone (OXY-IR) 5 MG capsule   methocarbamol (ROBAXIN) 750 MG tablet   Facet arthritis of lumbar region       Relevant Medications   oxycodone (OXY-IR) 5 MG  capsule   methocarbamol (ROBAXIN) 750 MG tablet   Cervicalgia       Low back derangement syndrome       Relevant Medications   oxycodone (OXY-IR) 5 MG capsule   methocarbamol (ROBAXIN) 750 MG tablet      ----------------------------------------------------------------------------------------------------------------------  1. DDD (degenerative disc disease), lumbar  Positive for oxycodone and metabolites as expected  2. Facet arthritis of lumbar region  Continue back stretching strengthening exercises as tolerated  3. Anemia, iron deficiency  -   4. Cervicalgia  - ToxASSURE Select 13 (MW), Urine  5. Low back derangement syndrome Unfortunately she is not a candidate for repeat epidural steroid injections and has shown no response to previous interventional therapy. She is also a high risk candidate with her history of thrombocytopenia and severe COPD. She has gained significant improvement with opioid management and I do feel that under the circumstances, being that she has failed conservative therapy, she is a candidate for continued chronic opioid management for her severe pain. She is to return to clinic in 1 month    ----------------------------------------------------------------------------------------------------------------------  I have changed Whitney Cline's oxycodone. I am also having her maintain her albuterol, atorvastatin, budesonide-formoterol, carvedilol, cetirizine, cholecalciferol, colestipol, ferrous sulfate, furosemide, gabapentin, metFORMIN, naloxone,  quinapril, Sofosbuvir-Velpatasvir, spironolactone, tiotropium, Liraglutide, and methocarbamol.   Meds ordered this encounter  Medications  . oxycodone (OXY-IR) 5 MG capsule    Sig: Take 1 capsule (5 mg total) by mouth 2 (two) times daily.    Dispense:  60 capsule    Refill:  0  . methocarbamol (ROBAXIN) 750 MG tablet    Sig: Take 1 tablet (750 mg total) by mouth 2 (two) times daily.    Dispense:  60 tablet     Refill:  5       Follow-up: Return in about 1 month (around 04/04/2016) for evaluation, med refill.    Molli Barrows, MD

## 2016-04-14 ENCOUNTER — Encounter: Payer: Self-pay | Admitting: Anesthesiology

## 2016-04-14 ENCOUNTER — Ambulatory Visit: Payer: Medicare Other | Attending: Anesthesiology | Admitting: Anesthesiology

## 2016-04-14 VITALS — BP 152/62 | HR 76 | Temp 98.3°F | Resp 16 | Ht 66.5 in | Wt 285.0 lb

## 2016-04-14 DIAGNOSIS — M545 Low back pain, unspecified: Secondary | ICD-10-CM

## 2016-04-14 DIAGNOSIS — M5386 Other specified dorsopathies, lumbar region: Secondary | ICD-10-CM

## 2016-04-14 MED ORDER — OXYCODONE HCL 5 MG PO CAPS: 5.0000 mg | ORAL_CAPSULE | Freq: Two times a day (BID) | ORAL | 0 refills | Status: DC

## 2016-04-14 MED ORDER — DEXAMETHASONE SODIUM PHOSPHATE 4 MG/ML IJ SOLN
4.0000 mg | Freq: Once | INTRAMUSCULAR | Status: DC
  Filled 2016-04-14: qty 1

## 2016-04-14 MED ORDER — ROPIVACAINE HCL 2 MG/ML IJ SOLN
1.0000 mL | Freq: Once | INTRAMUSCULAR | Status: DC
  Filled 2016-04-14: qty 10

## 2016-04-14 NOTE — Patient Instructions (Signed)
Trigger Point Injection Trigger points are areas where you have muscle pain. A trigger point injection is a shot given in the trigger point to relieve that pain. A trigger point might feel like a knot in your muscle. It hurts to press on a trigger point. Sometimes the pain spreads out (radiates) to other parts of the body. For example, pressing on a trigger point in your shoulder might cause pain in your arm or neck. You might have one trigger point. Or, you might have more than one. People often have trigger points in their upper back and lower back. They also occur often in the neck and shoulders. Pain from a trigger point lasts for a long time. It can make it hard to keep moving. You might not be able to do the exercise or physical therapy that could help you deal with the pain. A trigger point injection may help. It does not work for everyone. But, it may relieve your pain for a few days or a few months. A trigger point injection does not cure long-lasting (chronic) pain. LET YOUR CAREGIVER KNOW ABOUT:  Any allergies (especially to latex, lidocaine, or steroids).  Blood-thinning medicines that you take. These drugs can lead to bleeding or bruising after an injection. They include:  Aspirin.  Ibuprofen.  Clopidogrel.  Warfarin.  Other medicines you take. This includes all vitamins, herbs, eyedrops, over-the-counter medicines, and creams.  Use of steroids.  Recent infections.  Past problems with numbing medicines.  Bleeding problems.  Surgeries you have had.  Other health problems. RISKS AND COMPLICATIONS A trigger point injection is a safe treatment. However, problems may develop, such as:  Minor side effects usually go away in 1 to 2 days. These may include:  Soreness.  Bruising.  Stiffness.  More serious problems are rare. But, they may include:  Bleeding under the skin (hematoma).  Skin infection.  Breaking off of the needle under your skin.  Lung  puncture.  The trigger point injection may not work for you. BEFORE THE PROCEDURE You may need to stop taking any medicine that thins your blood. This is to prevent bleeding and bruising. Usually these medicines are stopped several days before the injection. No other preparation is needed. PROCEDURE  A trigger point injection can be given in your caregiver's office or in a clinic. Each injection takes 2 minutes or less.  Your caregiver will feel for trigger points. The caregiver may use a marker to circle the area for the injection.  The skin over the trigger point will be washed with a germ-killing (antiseptic) solution.  The caregiver pinches the spot for the injection.  Then, a very thin needle is used for the shot. You may feel pain or a twitching feeling when the needle enters the trigger point.  A numbing solution may be injected into the trigger point. Sometimes a drug to keep down swelling, redness, and warmth (inflammation) is also injected.  Your caregiver moves the needle around the trigger zone until the tightness and twitching goes away.  After the injection, your caregiver may put gentle pressure over the injection site.  Then it is covered with a bandage. AFTER THE PROCEDURE  You can go right home after the injection.  The bandage can be taken off after a few hours.  You may feel sore and stiff for 1 to 2 days.  Go back to your regular activities slowly. Your caregiver may ask you to stretch your muscles. Do not do anything that takes   extra energy for a few days.  Follow your caregiver's instructions to manage and treat other pain.   This information is not intended to replace advice given to you by your health care provider. Make sure you discuss any questions you have with your health care provider.   Document Released: 07/08/2011 Document Revised: 11/13/2012 Document Reviewed: 07/08/2011 Elsevier Interactive Patient Education 2016 [employer] Pain  Management Discharge Instructions  General Discharge Instructions :  If you need to reach your doctor call: Monday-Friday 8:00 am - 4:00 pm at [phone] or toll free [phone].  After clinic hours [phone] to have operator reach doctor.  Bring all of your medication bottles to all your appointments in the pain clinic.  To cancel or reschedule your appointment with Pain Management please remember to call 24 hours in advance to avoid a fee.  Refer to the educational materials which you have been given on: General Risks, I had my Procedure. Discharge Instructions, Post Sedation.  Post Procedure Instructions:  The drugs you were given will stay in your system until tomorrow, so for the next 24 hours you should not drive, make any legal decisions or drink any alcoholic beverages.  You may eat anything you prefer, but it is better to start with liquids then soups and crackers, and gradually work up to solid foods.  Please notify your doctor immediately if you have any unusual bleeding, trouble breathing or pain that is not related to your normal pain.  Depending on the type of procedure that was done, some parts of your body may feel week and/or numb.  This usually clears up by tonight or the next day.  Walk with the use of an assistive device or accompanied by an adult for the 24 hours.  You may use ice on the affected area for the first 24 hours.  Put ice in a Ziploc bag and cover with a towel and place against area 15 minutes on 15 minutes off.  You may switch to heat after 24 hours.

## 2016-04-14 NOTE — Progress Notes (Signed)
Subjective:  Patient ID: Whitney Cline, female    DOB: June 15, 1946  Age: 70 y.o. MRN: [id number]  CC: Back Pain (lower); Knee Pain (left); and Neck Pain   HPI Whitney Cline presents for reevaluation Today. She is referred by her physician Dr. Iona Beard at [facility] for evaluation and management of her low back pain neck pain and right shoulder pain. She's had these complaints for several months and describes them as an aching and annoying dull often disabling pain that does not appear to be influenced by time of day. She has received opioids for management in the past by the pain control Center at [facility] and these were beneficial. Furthermore she had epidural steroid injections for her low back pain but these were ineffective and caused a significant transient increase in her blood sugar which at one point required hospitalization to get her blood sugar under control for 1 week. She is referred by her primary care team to the pain control Center for management of her opioid medications and our assistance. She states that she has had previous x-rays and evaluations in the past.   She reports today for reevaluation. Her urine tox screen was appropriate and the quality characteristic and distribution of her pain are otherwise unchanged from her previous evaluation.No significant changes are otherwise noted Except for a recent exacerbation of her right posterior buttock pain. Otherwise the quality characteristic and distribution of her pain a been stable in nature. She's taking her medications as prescribed with no untoward effects per our discussion today.  Cervical CT reveals evidence of multilevel degenerative disc disease and facet arthropathy Lumbar MRI: None on record   History Whitney Cline has a past medical history of Anemia; Arthritis; Asthma; Cancer ([place]); CHF (congestive heart failure) ([place]); Chronic kidney disease; COPD (chronic obstructive pulmonary disease) ([place]); Diabetes mellitus without  complication ([place]); Encounter for blood transfusion; Hepatitis C; Hypertension; Obesity; Oxygen deficiency; and Sleep apnea.   She has a past surgical history that includes Abdominal hysterectomy and Coronary angioplasty with stent (N/A, 2007).   Her family history includes Diabetes in her father; Heart disease in her brother and mother.She reports that she quit smoking about 4 years ago. Her smoking use included Cigarettes. She has never used smokeless tobacco. She reports that she does not use drugs. Her alcohol history is not on file.   ---------------------------------------------------------------------------------------------------------------------- Past Medical History:  Diagnosis Date  . Anemia   . Arthritis   . Asthma   . Cancer [facility])    liver ca- had ablation cancer free now  . CHF (congestive heart failure) ([place])   . Chronic kidney disease   . COPD (chronic obstructive pulmonary disease) ([place])   . Diabetes mellitus without complication ([place])   . Encounter for blood transfusion   . Hepatitis C   . Hypertension   . Obesity   . Oxygen deficiency   . Sleep apnea     Past Surgical History:  Procedure Laterality Date  . ABDOMINAL HYSTERECTOMY    . CORONARY ANGIOPLASTY WITH STENT PLACEMENT N/A 2007    Family History  Problem Relation Age of Onset  . Heart disease Mother   . Diabetes Father   . Heart disease Brother     Social History  Substance Use Topics  . Smoking status: Former Smoker    Types: Cigarettes    Quit date: 06/22/2011  . Smokeless tobacco: Never Used  . Alcohol use Not on file    ---------------------------------------------------------------------------------------------------------------------- Social History   Social History  .  Marital status: Single    Spouse name: N/A  . Number of children: N/A  . Years of education: N/A   Social History Main Topics  . Smoking status: Former Smoker    Types: Cigarettes    Quit date: 06/22/2011  .  Smokeless tobacco: Never Used  . Alcohol use None  . Drug use: No  . Sexual activity: Not Asked   Other Topics Concern  . None   Social History Narrative  . 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 Hematologic: Anemia thrombocytopenia Endocrine: Diabetes   Objective:  BP (!) 152/62 (BP Location: Left Arm)   Pulse 76   Temp 98.3 F (36.8 C) (Oral)   Resp 16   Ht 5' 6.5" (1.689 m)   Wt 285 lb (129.3 kg)   SpO2 100%   BMI 45.31 kg/m   Physical Exam  Patient is alert and oriented 3 cooperative compliant a good historian Pupils are equally round reactive to light and EOMI Heart is regular rate and rhythm with distant heart sounds and no audible murmur Lungs are clear to also dictation with distant lung sounds no rhonchi or rales or wheezing noted Inspection low back reveals some paraspinous muscle tenderness With a trigger point in the right gluteal region.   Assessment & Plan:   Whitney Cline was seen today for back pain, knee pain and neck pain.  Diagnoses and all orders for this visit:  Low back derangement syndrome -     dexamethasone (DECADRON) injection 4 mg; 1 mL (4 mg total) by Other route once. -     ropivacaine (PF) 2 mg/ml (0.2%) (NAROPIN) epidural 1 mL; 1 mL by Epidural route once.  Low back pain at multiple sites -     dexamethasone (DECADRON) injection 4 mg; 1 mL (4 mg total) by Other route once. -     ropivacaine (PF) 2 mg/ml (0.2%) (NAROPIN) epidural 1 mL; 1 mL by Epidural route once.  Other orders -     oxycodone (OXY-IR) 5 MG capsule; Take 1 capsule (5 mg total) by mouth 2 (two) times  daily.     ----------------------------------------------------------------------------------------------------------------------  Problem List Items Addressed This Visit    None    Visit Diagnoses    Low back derangement syndrome    -  Primary   Relevant Medications   dexamethasone (DECADRON) injection 4 mg   ropivacaine (PF) 2 mg/ml (0.2%) (NAROPIN) epidural 1 mL   oxycodone (OXY-IR) 5 MG capsule   Low back pain at multiple sites       Relevant Medications   dexamethasone (DECADRON) injection 4 mg   ropivacaine (PF) 2 mg/ml (0.2%) (NAROPIN) epidural 1 mL   oxycodone (OXY-IR) 5 MG capsule      ----------------------------------------------------------------------------------------------------------------------  1. DDD (degenerative disc disease), lumbar  Positive for oxycodone and metabolites as expected  2. Facet arthritis of lumbar region  Continue back stretching strengthening exercises as tolerated  3. Anemia, iron deficiency  -   4. Cervicalgia  - ToxASSURE Select 13 (MW), Urine  5. Low back derangement syndrome Unfortunately she is not a candidate for repeat epidural steroid injections and has shown no response to previous interventional therapy. She is also a high risk candidate with her history of thrombocytopenia and severe COPD. She has gained significant improvement with opioid management and I do feel that under the  circumstances, being that she has failed conservative therapy, she is a candidate for continued chronic opioid management for her severe pain. She is to return to clinic in 1 month. We will proceed with a trigger point injection to the right gluteal region to see if we can get her pain under better control. She is to return to clinic in 1 month for reevaluation and refills on her prescriptions were given today.    ----------------------------------------------------------------------------------------------------------------------  I am having  Ms. Whitney Cline maintain her albuterol, atorvastatin, budesonide-formoterol, carvedilol, cetirizine, cholecalciferol, colestipol, ferrous sulfate, furosemide, gabapentin, metFORMIN, naloxone, quinapril, Sofosbuvir-Velpatasvir, spironolactone, tiotropium, Liraglutide, methocarbamol, and oxycodone. We will continue to administer dexamethasone and ropivacaine (PF) 2 mg/ml (0.2%).   Meds ordered this encounter  Medications  . dexamethasone (DECADRON) injection 4 mg  . ropivacaine (PF) 2 mg/ml (0.2%) (NAROPIN) epidural 1 mL  . oxycodone (OXY-IR) 5 MG capsule    Sig: Take 1 capsule (5 mg total) by mouth 2 (two) times daily.    Dispense:  60 capsule    Refill:  0    Do not fill until [id number]    Procedure: Right gluteal trigger point injectionTrigger point injection: The area overlying the aforementioned trigger points were prepped with alcohol. They were then injected with a 25-gauge needle with 8 cc of ropivacaine 0.2% and Decadron 4 mg at the  site after negative aspiration for heme. This was performed after informed consent was obtained and risks and benefits reviewed. She tolerated this procedure without difficulty and was convalesced and discharged to home in stable condition for follow-up as mentioned.  @Madisen Ludvigsen  Andree Elk, MD@   Follow-up: Return for evaluation, med refill.    Molli Barrows, MD

## 2016-04-14 NOTE — Progress Notes (Signed)
Patient here for medication management and increased pain in right hip.  Patient requesting cortisone injection for pain.    Safety precautions to be maintained throughout the outpatient stay will include: orient to surroundings, keep bed in low position, maintain call bell within reach at all times, provide assistance with transfer out of bed and ambulation.

## 2016-05-11 ENCOUNTER — Ambulatory Visit: Payer: Medicare Other | Attending: Anesthesiology | Admitting: Anesthesiology

## 2016-05-11 ENCOUNTER — Encounter: Payer: Self-pay | Admitting: Anesthesiology

## 2016-05-11 VITALS — BP 147/48 | HR 78 | Temp 98.3°F | Resp 22 | Ht 66.0 in | Wt 285.0 lb

## 2016-05-11 DIAGNOSIS — Z8619 Personal history of other infectious and parasitic diseases: Secondary | ICD-10-CM | POA: Insufficient documentation

## 2016-05-11 DIAGNOSIS — G4730 Sleep apnea, unspecified: Secondary | ICD-10-CM | POA: Insufficient documentation

## 2016-05-11 DIAGNOSIS — M542 Cervicalgia: Secondary | ICD-10-CM | POA: Insufficient documentation

## 2016-05-11 DIAGNOSIS — Z87891 Personal history of nicotine dependence: Secondary | ICD-10-CM | POA: Diagnosis not present

## 2016-05-11 DIAGNOSIS — M545 Low back pain, unspecified: Secondary | ICD-10-CM

## 2016-05-11 DIAGNOSIS — E669 Obesity, unspecified: Secondary | ICD-10-CM | POA: Diagnosis not present

## 2016-05-11 DIAGNOSIS — Z79891 Long term (current) use of opiate analgesic: Secondary | ICD-10-CM | POA: Diagnosis not present

## 2016-05-11 DIAGNOSIS — D696 Thrombocytopenia, unspecified: Secondary | ICD-10-CM | POA: Diagnosis not present

## 2016-05-11 DIAGNOSIS — I509 Heart failure, unspecified: Secondary | ICD-10-CM | POA: Insufficient documentation

## 2016-05-11 DIAGNOSIS — M25511 Pain in right shoulder: Secondary | ICD-10-CM | POA: Insufficient documentation

## 2016-05-11 DIAGNOSIS — Z7984 Long term (current) use of oral hypoglycemic drugs: Secondary | ICD-10-CM | POA: Diagnosis not present

## 2016-05-11 DIAGNOSIS — N189 Chronic kidney disease, unspecified: Secondary | ICD-10-CM | POA: Diagnosis not present

## 2016-05-11 DIAGNOSIS — Z8249 Family history of ischemic heart disease and other diseases of the circulatory system: Secondary | ICD-10-CM | POA: Diagnosis not present

## 2016-05-11 DIAGNOSIS — I130 Hypertensive heart and chronic kidney disease with heart failure and stage 1 through stage 4 chronic kidney disease, or unspecified chronic kidney disease: Secondary | ICD-10-CM | POA: Insufficient documentation

## 2016-05-11 DIAGNOSIS — M47816 Spondylosis without myelopathy or radiculopathy, lumbar region: Secondary | ICD-10-CM

## 2016-05-11 DIAGNOSIS — Z6841 Body Mass Index (BMI) 40.0 and over, adult: Secondary | ICD-10-CM | POA: Insufficient documentation

## 2016-05-11 DIAGNOSIS — B1920 Unspecified viral hepatitis C without hepatic coma: Secondary | ICD-10-CM | POA: Diagnosis not present

## 2016-05-11 DIAGNOSIS — M4696 Unspecified inflammatory spondylopathy, lumbar region: Secondary | ICD-10-CM

## 2016-05-11 DIAGNOSIS — M5136 Other intervertebral disc degeneration, lumbar region: Secondary | ICD-10-CM | POA: Insufficient documentation

## 2016-05-11 DIAGNOSIS — Z8505 Personal history of malignant neoplasm of liver: Secondary | ICD-10-CM | POA: Diagnosis not present

## 2016-05-11 DIAGNOSIS — Z90710 Acquired absence of both cervix and uterus: Secondary | ICD-10-CM | POA: Diagnosis not present

## 2016-05-11 DIAGNOSIS — M5386 Other specified dorsopathies, lumbar region: Secondary | ICD-10-CM

## 2016-05-11 DIAGNOSIS — G8929 Other chronic pain: Secondary | ICD-10-CM | POA: Insufficient documentation

## 2016-05-11 DIAGNOSIS — E1122 Type 2 diabetes mellitus with diabetic chronic kidney disease: Secondary | ICD-10-CM | POA: Insufficient documentation

## 2016-05-11 DIAGNOSIS — J449 Chronic obstructive pulmonary disease, unspecified: Secondary | ICD-10-CM | POA: Diagnosis not present

## 2016-05-11 DIAGNOSIS — Z955 Presence of coronary angioplasty implant and graft: Secondary | ICD-10-CM | POA: Diagnosis not present

## 2016-05-11 MED ORDER — OXYCODONE HCL 5 MG PO CAPS: 5.0000 mg | ORAL_CAPSULE | Freq: Two times a day (BID) | ORAL | 0 refills | Status: DC

## 2016-05-11 NOTE — Progress Notes (Signed)
Pt no longer taking BP medications but increased Lasix   Safety precautions to be maintained throughout the outpatient stay will include: orient to surroundings, keep bed in low position, maintain call bell within reach at all times, provide assistance with transfer out of bed and ambulation.

## 2016-05-12 NOTE — Progress Notes (Signed)
Subjective:  Patient ID: Whitney Cline, female    DOB: 08-27-1945  Age: 70 y.o. MRN: [id number]  CC: Back Pain (lower)   HPI Whitney Cline presents for reevaluation Today. She states that she's doing very well following her last myoneural block to the right gluteal region. That pain has essentially resolved. She is also taking her medications without difficulty and based on her narcotic assessment sheet she is doing well with this current regimen. No diverting or illicit use problems are noted and she is tolerating her medications well.  On history She is referred by her physician Dr. Iona Beard at [facility] for evaluation and management of her low back pain neck pain and right shoulder pain. She's had these complaints for several months and describes them as an aching and annoying dull often disabling pain that does not appear to be influenced by time of day. She has received opioids for management in the past by the pain control Center at [facility] and these were beneficial. Furthermore she had epidural steroid injections for her low back pain but these were ineffective and caused a significant transient increase in her blood sugar which at one point required hospitalization to get her blood sugar under control for 1 week. She is referred by her primary care team to the pain control Center for management of her opioid medications and our assistance. She states that she has had previous x-rays and evaluations in the past.   Cervical CT reveals evidence of multilevel degenerative disc disease and facet arthropathy Lumbar MRI: None on record   History Whitney Cline has a past medical history of Anemia; Arthritis; Asthma; Cancer ([place]); CHF (congestive heart failure) ([place]); Chronic kidney disease; COPD (chronic obstructive pulmonary disease) ([place]); Diabetes mellitus without complication ([place]); Encounter for blood transfusion; Hepatitis C; Hypertension; Obesity; Oxygen deficiency; Shingles; and Sleep apnea.    She has a past surgical history that includes Abdominal hysterectomy and Coronary angioplasty with stent (N/A, 2007).   Her family history includes Diabetes in her father; Heart disease in her brother and mother.She reports that she quit smoking about 4 years ago. Her smoking use included Cigarettes. She has never used smokeless tobacco. She reports that she does not use drugs. Her alcohol history is not on file.   ---------------------------------------------------------------------------------------------------------------------- Past Medical History:  Diagnosis Date  . Anemia   . Arthritis   . Asthma   . Cancer [facility])    liver ca- had ablation cancer free now  . CHF (congestive heart failure) ([place])   . Chronic kidney disease   . COPD (chronic obstructive pulmonary disease) ([place])   . Diabetes mellitus without complication ([place])   . Encounter for blood transfusion   . Hepatitis C   . Hypertension   . Obesity   . Oxygen deficiency   . Shingles    2017  . Sleep apnea     Past Surgical History:  Procedure Laterality Date  . ABDOMINAL HYSTERECTOMY    . CORONARY ANGIOPLASTY WITH STENT PLACEMENT N/A 2007    Family History  Problem Relation Age of Onset  . Heart disease Mother   . Diabetes Father   . Heart disease Brother     Social History  Substance Use Topics  . Smoking status: Former Smoker    Types: Cigarettes    Quit date: 06/22/2011  . Smokeless tobacco: Never Used  . Alcohol use Not on file    ---------------------------------------------------------------------------------------------------------------------- Social History   Social History  . Marital status: Single  Spouse name: N/A  . Number of children: N/A  . Years of education: N/A   Social History Main Topics  . Smoking status: Former Smoker    Types: Cigarettes    Quit date: 06/22/2011  . Smokeless tobacco: Never Used  . Alcohol use None  . Drug use: No  . Sexual activity: Not Asked    Other Topics Concern  . None   Social History Narrative  . 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 Hematologic: Anemia thrombocytopenia Endocrine: Diabetes   Objective:  BP (!) 147/48 (BP Location: Left Arm, Patient Position: Sitting, Cuff Size: Large)   Pulse 78   Temp 98.3 F (36.8 C) (Oral)   Resp (!) 22   Ht 5\' 6"  (1.676 m)   Wt 285 lb (129.3 kg)   SpO2 99%   BMI 46.00 kg/m   Physical Exam  Patient is alert and oriented 3 cooperative compliant a good historian Pupils are equally round reactive to light and EOMI Heart is regular rate and rhythm with distant heart sounds and no audible murmur Lungs are clear to also dictation with distant lung sounds no rhonchi or rales or wheezing noted Inspection low back reveals some paraspinous muscle tenderness With a trigger point in the right gluteal region.   Assessment & Plan:   Whitney Cline was seen today for back pain.  Diagnoses and all orders for this visit:  Low back derangement syndrome  Low back pain at multiple sites  DDD (degenerative disc disease), lumbar  Facet arthritis of lumbar region ([place])  Cervicalgia  Other orders -     Discontinue: oxycodone (OXY-IR) 5 MG capsule; Take 1 capsule (5 mg total) by mouth 2 (two) times daily. -     oxycodone (OXY-IR) 5 MG capsule; Take 1 capsule (5 mg total) by mouth 2 (two) times daily.     ----------------------------------------------------------------------------------------------------------------------  Problem List Items Addressed This Visit    None    Visit Diagnoses    Low back derangement syndrome    -  Primary   Relevant Medications   oxycodone (OXY-IR) 5 MG capsule   Low back pain at multiple  sites       Relevant Medications   oxycodone (OXY-IR) 5 MG capsule   DDD (degenerative disc disease), lumbar       Relevant Medications   oxycodone (OXY-IR) 5 MG capsule   Facet arthritis of lumbar region ([place])       Relevant Medications   oxycodone (OXY-IR) 5 MG capsule   Cervicalgia          ----------------------------------------------------------------------------------------------------------------------  1. DDD (degenerative disc disease), lumbar  Positive for oxycodone and metabolites as expected. We will refill her medications with return to clinic in 2 months  2. Facet arthritis of lumbar region  Continue back stretching strengthening exercises as tolerated  3. Anemia, iron deficiency  -   4. Cervicalgia  - ToxASSURE Select 13 (MW), Urine  5. Low back derangement syndrome We will defer on any repeat injections today   ----------------------------------------------------------------------------------------------------------------------  I am having Whitney Cline maintain her albuterol, atorvastatin, budesonide-formoterol, carvedilol, cetirizine, cholecalciferol, colestipol, ferrous sulfate, furosemide, gabapentin, metFORMIN, naloxone, quinapril, Sofosbuvir-Velpatasvir, spironolactone, tiotropium, liraglutide, methocarbamol, and oxycodone. We will continue to administer dexamethasone and ropivacaine (PF) 2 mg/ml (0.2%).   Meds ordered this encounter  Medications  . DISCONTD: oxycodone (OXY-IR) 5  MG capsule    Sig: Take 1 capsule (5 mg total) by mouth 2 (two) times daily.    Dispense:  60 capsule    Refill:  0    Do not fill until [id number]  . oxycodone (OXY-IR) 5 MG capsule    Sig: Take 1 capsule (5 mg total) by mouth 2 (two) times daily.    Dispense:  60 capsule    Refill:  0    Do not fill until [id number]   Follow-up: Return in about 2 months (around 07/11/2016) for med refill, evaluation.    Molli Barrows, MD

## 2016-07-13 ENCOUNTER — Ambulatory Visit: Payer: Medicare Other | Attending: Anesthesiology | Admitting: Anesthesiology

## 2016-07-13 ENCOUNTER — Encounter: Payer: Self-pay | Admitting: Anesthesiology

## 2016-07-13 VITALS — BP 164/64 | HR 88 | Temp 98.4°F | Resp 16 | Ht 66.0 in | Wt 290.0 lb

## 2016-07-13 DIAGNOSIS — Z90710 Acquired absence of both cervix and uterus: Secondary | ICD-10-CM | POA: Insufficient documentation

## 2016-07-13 DIAGNOSIS — D509 Iron deficiency anemia, unspecified: Secondary | ICD-10-CM | POA: Diagnosis not present

## 2016-07-13 DIAGNOSIS — M542 Cervicalgia: Secondary | ICD-10-CM | POA: Insufficient documentation

## 2016-07-13 DIAGNOSIS — Z79899 Other long term (current) drug therapy: Secondary | ICD-10-CM | POA: Insufficient documentation

## 2016-07-13 DIAGNOSIS — Z87891 Personal history of nicotine dependence: Secondary | ICD-10-CM | POA: Diagnosis not present

## 2016-07-13 DIAGNOSIS — M545 Low back pain, unspecified: Secondary | ICD-10-CM

## 2016-07-13 DIAGNOSIS — M5136 Other intervertebral disc degeneration, lumbar region: Secondary | ICD-10-CM | POA: Diagnosis not present

## 2016-07-13 DIAGNOSIS — M4696 Unspecified inflammatory spondylopathy, lumbar region: Secondary | ICD-10-CM | POA: Insufficient documentation

## 2016-07-13 DIAGNOSIS — M47816 Spondylosis without myelopathy or radiculopathy, lumbar region: Secondary | ICD-10-CM

## 2016-07-13 DIAGNOSIS — M5386 Other specified dorsopathies, lumbar region: Secondary | ICD-10-CM

## 2016-07-13 DIAGNOSIS — Z5189 Encounter for other specified aftercare: Secondary | ICD-10-CM | POA: Insufficient documentation

## 2016-07-13 MED ORDER — OXYCODONE HCL 5 MG PO TABS: 5.0000 mg | ORAL_TABLET | Freq: Two times a day (BID) | ORAL | 0 refills | Status: DC

## 2016-07-13 NOTE — Progress Notes (Signed)
Safety precautions to be maintained throughout the outpatient stay will include: orient to surroundings, keep bed in low position, maintain call bell within reach at all times, provide assistance with transfer out of bed and ambulation.  

## 2016-07-14 NOTE — Progress Notes (Signed)
Subjective:  Patient ID: Whitney Cline, female    DOB: 03/22/1946  Age: 70 y.o. MRN: [id number]  CC: Back Pain (lower)   HPI Whitney Cline returns for evaluation. She was last seen a month ago and continues to do well with her current medication regimen. Her pain remains under reasonable control and based on our discussion today she feels that her overall status is better with medications as we have reviewed on her her chronic assessment sheet. We have reviewed the practitioner database and she has been compliant   On history She is referred by her physician Dr. Iona Cline at [facility] for evaluation and management of her low back pain neck pain and right shoulder pain. She's had these complaints for several months and describes them as an aching and annoying dull often disabling pain that does not appear to be influenced by time of day. She has received opioids for management in the past by the pain control Center at [facility] and these were beneficial. Furthermore she had epidural steroid injections for her low back pain but these were ineffective and caused a significant transient increase in her blood sugar which at one point required hospitalization to get her blood sugar under control for 1 week. She is referred by her primary care team to the pain control Center for management of her opioid medications and our assistance. She states that she has had previous x-rays and evaluations in the past.   Cervical CT reveals evidence of multilevel degenerative disc disease and facet arthropathy Lumbar MRI: None on record   History Whitney Cline has a past medical history of Anemia; Arthritis; Asthma; Cancer ([place]); CHF (congestive heart failure) ([place]); Chronic kidney disease; COPD (chronic obstructive pulmonary disease) ([place]); Diabetes mellitus without complication ([place]); Encounter for blood transfusion; Hepatitis C; Hypertension; Obesity; Oxygen deficiency; Shingles; and Sleep apnea.   She has a past surgical  history that includes Abdominal hysterectomy and Coronary angioplasty with stent (N/A, 2007).   Her family history includes Diabetes in her father; Heart disease in her brother and mother.She reports that she quit smoking about 5 years ago. Her smoking use included Cigarettes. She has never used smokeless tobacco. She reports that she does not use drugs. Her alcohol history is not on file.   ---------------------------------------------------------------------------------------------------------------------- Past Medical History:  Diagnosis Date  . Anemia   . Arthritis   . Asthma   . Cancer [facility])    liver ca- had ablation cancer free now  . CHF (congestive heart failure) ([place])   . Chronic kidney disease   . COPD (chronic obstructive pulmonary disease) ([place])   . Diabetes mellitus without complication ([place])   . Encounter for blood transfusion   . Hepatitis C   . Hypertension   . Obesity   . Oxygen deficiency   . Shingles    2017  . Sleep apnea     Past Surgical History:  Procedure Laterality Date  . ABDOMINAL HYSTERECTOMY    . CORONARY ANGIOPLASTY WITH STENT PLACEMENT N/A 2007    Family History  Problem Relation Age of Onset  . Heart disease Mother   . Diabetes Father   . Heart disease Brother     Social History  Substance Use Topics  . Smoking status: Former Smoker    Types: Cigarettes    Quit date: 06/22/2011  . Smokeless tobacco: Never Used  . Alcohol use Not on file    ---------------------------------------------------------------------------------------------------------------------- Social History   Social History  . Marital status: Single  Spouse name: N/A  . Number of children: N/A  . Years of education: N/A   Social History Main Topics  . Smoking status: Former Smoker    Types: Cigarettes    Quit date: 06/22/2011  . Smokeless tobacco: Never Used  . Alcohol use None  . Drug use: No  . Sexual activity: Not Asked   Other Topics Concern  .  None   Social History Narrative  . None      ----------------------------------------------------------------------------------------------------------------------  ROS Review of Systems  Cardiac: No chest pain GI no constipation  Objective:  BP (!) 164/64 (BP Location: Left Arm, Patient Position: Sitting, Cuff Size: Large)   Pulse 88   Temp 98.4 F (36.9 C) (Oral)   Resp 16   Ht '5\' 6"'$  (1.676 m)   Wt 290 lb (131.5 kg)   SpO2 98% Comment: O2 at 3lpm  BMI 46.81 kg/m   Physical Exam  Patient is alert and oriented 3 cooperative compliant a good historian Pupils are equally round reactive to light and EOMI Heart is regular rate and rhythm with distant heart sounds and no audible murmur Lungs are clear  Assessment & Plan:   Starr was seen today for back pain.  Diagnoses and all orders for this visit:  Low back derangement syndrome -     ToxASSURE Select 13 (MW), Urine  Cervicalgia -     ToxASSURE Select 13 (MW), Urine  Low back pain at multiple sites -     ToxASSURE Select 13 (MW), Urine  Facet arthritis of lumbar region ([place]) -     ToxASSURE Select 13 (MW), Urine  Other orders -     Discontinue: oxyCODONE (OXY IR/ROXICODONE) 5 MG immediate release tablet; Take 1 tablet (5 mg total) by mouth 2 (two) times daily. -     oxyCODONE (OXY IR/ROXICODONE) 5 MG immediate release tablet; Take 1 tablet (5 mg total) by mouth 2 (two) times daily.     ----------------------------------------------------------------------------------------------------------------------  Problem List Items Addressed This Visit    None    Visit Diagnoses    Low back derangement syndrome    -  Primary   Relevant Medications   oxyCODONE (OXY IR/ROXICODONE) 5 MG immediate release tablet   Other Relevant Orders   ToxASSURE Select 13 (MW), Urine   Cervicalgia       Relevant Orders   ToxASSURE Select 13 (MW), Urine   Low back pain at multiple sites       Relevant Medications   oxyCODONE  (OXY IR/ROXICODONE) 5 MG immediate release tablet   Other Relevant Orders   ToxASSURE Select 13 (MW), Urine   Facet arthritis of lumbar region ([place])       Relevant Medications   oxyCODONE (OXY IR/ROXICODONE) 5 MG immediate release tablet   Other Relevant Orders   ToxASSURE Select 13 (MW), Urine      ----------------------------------------------------------------------------------------------------------------------  1. DDD (degenerative disc disease), lumbar  Positive for oxycodone and metabolites as expected. We will refill her medications with return to clinic in 2 months  2. Facet arthritis of lumbar region  Continue back stretching strengthening exercises as tolerated  3. Anemia, iron deficiency  -   4. Cervicalgia  - ToxASSURE Select 13 (MW), Urine Rechecked today  5. Low back derangement syndrome We will defer on any repeat injections today   ----------------------------------------------------------------------------------------------------------------------  I am having Ms. Melnik maintain her albuterol, atorvastatin, budesonide-formoterol, carvedilol, cetirizine, cholecalciferol, colestipol, ferrous sulfate, furosemide, gabapentin, metFORMIN, naloxone, quinapril, Sofosbuvir-Velpatasvir, spironolactone, tiotropium, liraglutide, methocarbamol, glucose  blood, albuterol, azelastine, azelastine, FIFTY50 GLUCOSE METER 2.0, canagliflozin, ACCU-CHEK AVIVA PLUS, hydrOXYzine, hydrOXYzine, hydrOXYzine, FLUZONE HIGH-DOSE, lidocaine, metFORMIN, Pen Needles, triamcinolone cream, triamcinolone cream, triamcinolone cream, albuterol, insulin lispro, accu-chek multiclix, carvedilol, cetirizine, Vitamin D (Cholecalciferol), colestipol, ferrous sulfate, furosemide, gabapentin, metFORMIN, Naloxone HCl, budesonide-formoterol, tiotropium, liraglutide, and oxyCODONE. We will continue to administer dexamethasone and ropivacaine (PF) 2 mg/mL (0.2%).   Meds ordered this encounter  Medications   . glucose blood test strip    Sig: USE FOUR TIMES DAILY AS INSTRUCTED  . albuterol (PROVENTIL) (2.5 MG/3ML) 0.083% nebulizer solution    Sig: VVN Q 6 H PRF WHZ    Refill:  12  . azelastine (ASTELIN) 0.1 % nasal spray    Sig: Place into the nose.  Marland Kitchen azelastine (ASTELIN) 0.1 % nasal spray  . Blood Glucose Monitoring Suppl (FIFTY50 GLUCOSE METER 2.0) w/Device KIT    Sig: Use as directed. E11.9  . canagliflozin (INVOKANA) 100 MG TABS tablet    Sig: Take 100 mg by mouth.  Marland Kitchen ACCU-CHEK AVIVA PLUS test strip    Sig: USE QID UTD    Refill:  2  . hydrOXYzine (ATARAX/VISTARIL) 25 MG tablet    Sig: Take by mouth.  . hydrOXYzine (ATARAX/VISTARIL) 25 MG tablet  . hydrOXYzine (ATARAX/VISTARIL) 10 MG tablet  . FLUZONE HIGH-DOSE 0.5 ML SUSY    Sig: ADM 0.5ML IM UTD    Refill:  0  . lidocaine (XYLOCAINE) 5 % ointment    Sig: Apply topically.  . metFORMIN (GLUCOPHAGE-XR) 500 MG 24 hr tablet  . DISCONTD: oxyCODONE (OXY IR/ROXICODONE) 5 MG immediate release tablet  . Insulin Pen Needle (PEN NEEDLES) 32G X 4 MM MISC    Sig: Use for injections humalog TID times daily,  then glargine daily accucheck viva machine  . triamcinolone cream (KENALOG) 0.5 %    Sig: Apply topically.  . triamcinolone cream (KENALOG) 0.5 %  . triamcinolone cream (KENALOG) 0.1 %  . albuterol (PROVENTIL HFA;VENTOLIN HFA) 108 (90 Base) MCG/ACT inhaler    Sig: Inhale into the lungs.  . insulin lispro (HUMALOG) 100 UNIT/ML KiwkPen    Sig: Inject 20 units sq with breakfast, 20 units sq  With lunch, and 20 units sq with dinner  . Lancets (ACCU-CHEK MULTICLIX) lancets    Sig: USE ONCE D AS INSTRUCTED    Refill:  6  . carvedilol (COREG) 3.125 MG tablet    Sig: Take by mouth.  . cetirizine (ZYRTEC) 10 MG tablet    Sig: Take 10 mg by mouth.  . Vitamin D, Cholecalciferol, 400 units TABS    Sig: Take by mouth.  . colestipol (COLESTID) 1 g tablet    Sig: Take by mouth.  . ferrous sulfate 325 (65 FE) MG EC tablet    Sig: Take by  mouth.  . furosemide (LASIX) 20 MG tablet    Sig: Take by mouth.  . gabapentin (NEURONTIN) 300 MG capsule    Sig: Take 300 mg by mouth.  . metFORMIN (GLUCOPHAGE-XR) 500 MG 24 hr tablet    Sig: Take 500 mg by mouth.  . Naloxone HCl 0.4 MG/0.4ML SOAJ    Sig: Inject 1 Units as directed once daily as needed.  . budesonide-formoterol (SYMBICORT) 160-4.5 MCG/ACT inhaler    Sig: INHALE 2 PUFFS BY MOUTH TWICE DAILY  . tiotropium (SPIRIVA) 18 MCG inhalation capsule    Sig: Frequency:QD   Dosage:18   MCG  Instructions:  Note:Dose: 18 MCG  . liraglutide (VICTOZA) 18 MG/3ML SOPN  Sig: 1.8 mg.  . DISCONTD: oxyCODONE (OXY IR/ROXICODONE) 5 MG immediate release tablet    Sig: Take 1 tablet (5 mg total) by mouth 2 (two) times daily.    Dispense:  60 tablet    Refill:  0    Do not fill until [id number]  . oxyCODONE (OXY IR/ROXICODONE) 5 MG immediate release tablet    Sig: Take 1 tablet (5 mg total) by mouth 2 (two) times daily.    Dispense:  60 tablet    Refill:  0    Do not fill until [id number]   Follow-up: Return in about 2 months (around 09/13/2016) for evaluation, med refill.    Molli Barrows, MD

## 2016-07-18 LAB — TOXASSURE SELECT 13 (MW), URINE

## 2016-08-03 ENCOUNTER — Ambulatory Visit: Payer: Medicare Other | Admitting: Anesthesiology

## 2016-08-19 ENCOUNTER — Emergency Department: Payer: Medicare Other

## 2016-08-19 ENCOUNTER — Emergency Department
Admission: EM | Admit: 2016-08-19 | Discharge: 2016-08-19 | Disposition: A | Discharge status: Home / Self Care | Payer: Medicare Other | Attending: Emergency Medicine | Admitting: Emergency Medicine

## 2016-08-19 ENCOUNTER — Encounter: Payer: Self-pay | Admitting: Emergency Medicine

## 2016-08-19 DIAGNOSIS — Z79899 Other long term (current) drug therapy: Secondary | ICD-10-CM | POA: Insufficient documentation

## 2016-08-19 DIAGNOSIS — I509 Heart failure, unspecified: Secondary | ICD-10-CM | POA: Diagnosis not present

## 2016-08-19 DIAGNOSIS — R1110 Vomiting, unspecified: Secondary | ICD-10-CM | POA: Diagnosis not present

## 2016-08-19 DIAGNOSIS — N189 Chronic kidney disease, unspecified: Secondary | ICD-10-CM | POA: Diagnosis not present

## 2016-08-19 DIAGNOSIS — J449 Chronic obstructive pulmonary disease, unspecified: Secondary | ICD-10-CM | POA: Insufficient documentation

## 2016-08-19 DIAGNOSIS — E1122 Type 2 diabetes mellitus with diabetic chronic kidney disease: Secondary | ICD-10-CM | POA: Diagnosis not present

## 2016-08-19 DIAGNOSIS — Z87891 Personal history of nicotine dependence: Secondary | ICD-10-CM | POA: Insufficient documentation

## 2016-08-19 DIAGNOSIS — Z794 Long term (current) use of insulin: Secondary | ICD-10-CM | POA: Diagnosis not present

## 2016-08-19 DIAGNOSIS — I130 Hypertensive heart and chronic kidney disease with heart failure and stage 1 through stage 4 chronic kidney disease, or unspecified chronic kidney disease: Secondary | ICD-10-CM | POA: Insufficient documentation

## 2016-08-19 DIAGNOSIS — Z5321 Procedure and treatment not carried out due to patient leaving prior to being seen by health care provider: Secondary | ICD-10-CM | POA: Diagnosis not present

## 2016-08-19 DIAGNOSIS — J45909 Unspecified asthma, uncomplicated: Secondary | ICD-10-CM | POA: Insufficient documentation

## 2016-08-19 DIAGNOSIS — R109 Unspecified abdominal pain: Secondary | ICD-10-CM | POA: Diagnosis present

## 2016-08-19 LAB — CBC
HCT: 20.4 % — ABNORMAL LOW (ref 35.0–47.0)
HEMOGLOBIN: 6.5 g/dL — AB (ref 12.0–16.0)
MCH: 24.8 pg — ABNORMAL LOW (ref 26.0–34.0)
MCHC: 31.7 g/dL — ABNORMAL LOW (ref 32.0–36.0)
MCV: 78.2 fL — ABNORMAL LOW (ref 80.0–100.0)
PLATELETS: 246 10*3/uL (ref 150–440)
RBC: 2.61 MIL/uL — AB (ref 3.80–5.20)
RDW: 17.5 % — ABNORMAL HIGH (ref 11.5–14.5)
WBC: 13.7 10*3/uL — AB (ref 3.6–11.0)

## 2016-08-19 LAB — COMPREHENSIVE METABOLIC PANEL
ALK PHOS: 57 U/L (ref 38–126)
ALT: 19 U/L (ref 14–54)
ANION GAP: 13 (ref 5–15)
AST: 47 U/L — ABNORMAL HIGH (ref 15–41)
Albumin: 2.9 g/dL — ABNORMAL LOW (ref 3.5–5.0)
BUN: 97 mg/dL — ABNORMAL HIGH (ref 6–20)
CALCIUM: 8.9 mg/dL (ref 8.9–10.3)
CHLORIDE: 98 mmol/L — AB (ref 101–111)
CO2: 22 mmol/L (ref 22–32)
CREATININE: 1.7 mg/dL — AB (ref 0.44–1.00)
GFR, EST AFRICAN AMERICAN: 35 mL/min — AB (ref >60)
GFR, EST NON AFRICAN AMERICAN: 30 mL/min — AB (ref >60)
Glucose, Bld: 265 mg/dL — ABNORMAL HIGH (ref 65–99)
Potassium: 5.5 mmol/L — ABNORMAL HIGH (ref 3.5–5.1)
Sodium: 133 mmol/L — ABNORMAL LOW (ref 135–145)
Total Bilirubin: 1.3 mg/dL — ABNORMAL HIGH (ref 0.3–1.2)
Total Protein: 7.4 g/dL (ref 6.5–8.1)

## 2016-08-19 LAB — URINALYSIS, COMPLETE (UACMP) WITH MICROSCOPIC
BILIRUBIN URINE: NEGATIVE
GLUCOSE, UA: NEGATIVE mg/dL
Ketones, ur: 5 mg/dL — AB
NITRITE: NEGATIVE
PH: 5 (ref 5.0–8.0)
Protein, ur: NEGATIVE mg/dL
SPECIFIC GRAVITY, URINE: 1.016 (ref 1.005–1.030)

## 2016-08-19 LAB — LIPASE, BLOOD: LIPASE: 47 U/L (ref 11–51)

## 2016-08-19 MED ORDER — SODIUM CHLORIDE 0.9 % IV BOLUS (SEPSIS)
500.0000 mL | Freq: Once | INTRAVENOUS | Status: AC
  Administered 2016-08-19: 500 mL via INTRAVENOUS

## 2016-08-19 NOTE — ED Notes (Signed)
Pt family arrived and wanted to take pt to [facility]. Pt family states her doctors are at [facility] and they want to take her there. Pt was offered available room but stated she wanted to go to [facility].

## 2016-08-19 NOTE — ED Triage Notes (Signed)
Pt presents to ED via [facility] for c/o abdominal pain and vomiting. Pt also c/o SHOB. Pt states symptoms started yesterday and worsened at approx 0300 this morning. Pt is noted to be on chronic 2L. Pt denies taking any of her COPD medications yesterday.

## 2016-08-19 NOTE — ED Notes (Addendum)
33 G IV removed from left AC, IV catheter intact. No bleeding noted from site.

## 2016-08-19 NOTE — ED Notes (Signed)
Pt reports to this RN that she has been having black diarrhea. This RN started 500cc bolus of fluids per Dr. Jerene Canny order. Pt taken to X-ray at this time.

## 2016-08-19 NOTE — ED Triage Notes (Signed)
Here via [facility] for abdominal pain and vomiting. Blood sugar 250, HR 120. Wears 2 L [place] all the time. Has not taken meds.

## 2016-09-03 ENCOUNTER — Telehealth: Payer: Self-pay | Admitting: Anesthesiology

## 2016-09-03 ENCOUNTER — Other Ambulatory Visit: Payer: Self-pay

## 2016-09-03 NOTE — Telephone Encounter (Signed)
Called about needing sooner appt.

## 2016-09-03 NOTE — Telephone Encounter (Signed)
Pts nurse [place] called and she is requesting a prescription for pt because the doctor at the facility will not give pt anything because she see's doctor Andree Elk Nurse would like for someone to call her back  [phone]

## 2016-09-03 NOTE — Telephone Encounter (Signed)
Dr Andree Elk notified.  He will discuss with patient at next appointment.

## 2016-09-03 NOTE — Telephone Encounter (Signed)
Spoke with Whitney Cline at [facility].  States patient is out opf her oxycodone and wanted [place] to call her in something.  The doctor at [facility] would not order her anything because she was getting medications from here.  Patients last prescription was to last until 09-12-16.  Whitney Cline notified.  Patient has an appointment on 09-15-16.  Whitney Cline notified.  Will speak to Dr Andree Elk when he returns to office.

## 2016-09-15 ENCOUNTER — Encounter: Payer: Self-pay | Admitting: Anesthesiology

## 2016-09-15 ENCOUNTER — Ambulatory Visit: Payer: Medicare Other | Attending: Anesthesiology | Admitting: Anesthesiology

## 2016-09-15 VITALS — BP 145/62 | HR 71 | Temp 97.1°F | Resp 18 | Ht 66.0 in | Wt 315.0 lb

## 2016-09-15 DIAGNOSIS — M549 Dorsalgia, unspecified: Secondary | ICD-10-CM | POA: Diagnosis not present

## 2016-09-15 DIAGNOSIS — M545 Low back pain, unspecified: Secondary | ICD-10-CM

## 2016-09-15 DIAGNOSIS — M25511 Pain in right shoulder: Secondary | ICD-10-CM | POA: Insufficient documentation

## 2016-09-15 DIAGNOSIS — I130 Hypertensive heart and chronic kidney disease with heart failure and stage 1 through stage 4 chronic kidney disease, or unspecified chronic kidney disease: Secondary | ICD-10-CM | POA: Diagnosis not present

## 2016-09-15 DIAGNOSIS — I509 Heart failure, unspecified: Secondary | ICD-10-CM | POA: Diagnosis not present

## 2016-09-15 DIAGNOSIS — Z87891 Personal history of nicotine dependence: Secondary | ICD-10-CM | POA: Insufficient documentation

## 2016-09-15 DIAGNOSIS — M4696 Unspecified inflammatory spondylopathy, lumbar region: Secondary | ICD-10-CM

## 2016-09-15 DIAGNOSIS — M542 Cervicalgia: Secondary | ICD-10-CM

## 2016-09-15 DIAGNOSIS — Z9889 Other specified postprocedural states: Secondary | ICD-10-CM | POA: Diagnosis not present

## 2016-09-15 DIAGNOSIS — M47816 Spondylosis without myelopathy or radiculopathy, lumbar region: Secondary | ICD-10-CM

## 2016-09-15 DIAGNOSIS — Z90710 Acquired absence of both cervix and uterus: Secondary | ICD-10-CM | POA: Diagnosis not present

## 2016-09-15 DIAGNOSIS — Z0001 Encounter for general adult medical examination with abnormal findings: Secondary | ICD-10-CM | POA: Insufficient documentation

## 2016-09-15 MED ORDER — DEXAMETHASONE SODIUM PHOSPHATE 4 MG/ML IJ SOLN
INTRAMUSCULAR | Status: AC
  Administered 2016-09-15: 8 mg
  Filled 2016-09-15: qty 2

## 2016-09-15 MED ORDER — OXYCODONE HCL 5 MG PO TABS: 5.0000 mg | ORAL_TABLET | Freq: Three times a day (TID) | ORAL | 0 refills | Status: DC

## 2016-09-15 MED ORDER — DEXAMETHASONE SODIUM PHOSPHATE 4 MG/ML IJ SOLN: 10.0000 mg | Freq: Once | INTRAMUSCULAR | Status: DC

## 2016-09-15 MED ORDER — ROPIVACAINE HCL 2 MG/ML IJ SOLN
INTRAMUSCULAR | Status: AC
  Administered 2016-09-15: 15:00:00
  Filled 2016-09-15: qty 20

## 2016-09-15 MED ORDER — LIDOCAINE HCL (PF) 1 % IJ SOLN
INTRAMUSCULAR | Status: AC
  Filled 2016-09-15: qty 5

## 2016-09-15 MED ORDER — ROPIVACAINE HCL 2 MG/ML IJ SOLN: 10.0000 mL | Freq: Once | INTRAMUSCULAR | Status: DC

## 2016-09-15 NOTE — Patient Instructions (Signed)
GENERAL RISKS AND COMPLICATIONS  What are the risk, side effects and possible complications? Generally speaking, most procedures are safe.  However, with any procedure there are risks, side effects, and the possibility of complications.  The risks and complications are dependent upon the sites that are lesioned, or the type of nerve block to be performed.  The closer the procedure is to the spine, the more serious the risks are.  Great care is taken when placing the radio frequency needles, block needles or lesioning probes, but sometimes complications can occur. 1. Infection: Any time there is an injection through the skin, there is a risk of infection.  This is why sterile conditions are used for these blocks.  There are four possible types of infection. 1. Localized skin infection. 2. Central Nervous System Infection-This can be in the form of Meningitis, which can be deadly. 3. Epidural Infections-This can be in the form of an epidural abscess, which can cause pressure inside of the spine, causing compression of the spinal cord with subsequent paralysis. This would require an emergency surgery to decompress, and there are no guarantees that the patient would recover from the paralysis. 4. Discitis-This is an infection of the intervertebral discs.  It occurs in about 1% of discography procedures.  It is difficult to treat and it may lead to surgery.        2. Pain: the needles have to go through skin and soft tissues, will cause soreness.       3. Damage to internal structures:  The nerves to be lesioned may be near blood vessels or    other nerves which can be potentially damaged.       4. Bleeding: Bleeding is more common if the patient is taking blood thinners such as  aspirin, Coumadin, Ticiid, Plavix, etc., or if he/she have some genetic predisposition  such as hemophilia. Bleeding into the spinal canal can cause compression of the spinal  cord with subsequent paralysis.  This would require an  emergency surgery to  decompress and there are no guarantees that the patient would recover from the  paralysis.       5. Pneumothorax:  Puncturing of a lung is a possibility, every time a needle is introduced in  the area of the chest or upper back.  Pneumothorax refers to free air around the  collapsed lung(s), inside of the thoracic cavity (chest cavity).  Another two possible  complications related to a similar event would include: Hemothorax and Chylothorax.   These are variations of the Pneumothorax, where instead of air around the collapsed  lung(s), you may have blood or chyle, respectively.       6. Spinal headaches: They may occur with any procedures in the area of the spine.       7. Persistent CSF (Cerebro-Spinal Fluid) leakage: This is a rare problem, but may occur  with prolonged intrathecal or epidural catheters either due to the formation of a fistulous  track or a dural tear.       8. Nerve damage: By working so close to the spinal cord, there is always a possibility of  nerve damage, which could be as serious as a permanent spinal cord injury with  paralysis.       9. Death:  Although rare, severe deadly allergic reactions known as "Anaphylactic  reaction" can occur to any of the medications used.      10. Worsening of the symptoms:  We can always make thing worse.    What are the chances of something like this happening? Chances of any of this occuring are extremely low.  By statistics, you have more of a chance of getting killed in a motor vehicle accident: while driving to the hospital than any of the above occurring .  Nevertheless, you should be aware that they are possibilities.  In general, it is similar to taking a shower.  Everybody knows that you can slip, hit your head and get killed.  Does that mean that you should not shower again?  Nevertheless always keep in mind that statistics do not mean anything if you happen to be on the wrong side of them.  Even if a procedure has a 1  (one) in a 1,000,000 (million) chance of going wrong, it you happen to be that one..Also, keep in mind that by statistics, you have more of a chance of having something go wrong when taking medications.  Who should not have this procedure? If you are on a blood thinning medication (e.g. Coumadin, Plavix, see list of "Blood Thinners"), or if you have an active infection going on, you should not have the procedure.  If you are taking any blood thinners, please inform your physician.  How should I prepare for this procedure?  Do not eat or drink anything at least six hours prior to the procedure.  Bring a driver with you .  It cannot be a taxi.  Come accompanied by an adult that can drive you back, and that is strong enough to help you if your legs get weak or numb from the local anesthetic.  Take all of your medicines the morning of the procedure with just enough water to swallow them.  If you have diabetes, make sure that you are scheduled to have your procedure done first thing in the morning, whenever possible.  If you have diabetes, take only half of your insulin dose and notify our nurse that you have done so as soon as you arrive at the clinic.  If you are diabetic, but only take blood sugar pills (oral hypoglycemic), then do not take them on the morning of your procedure.  You may take them after you have had the procedure.  Do not take aspirin or any aspirin-containing medications, at least eleven (11) days prior to the procedure.  They may prolong bleeding.  Wear loose fitting clothing that may be easy to take off and that you would not mind if it got stained with Betadine or blood.  Do not wear any jewelry or perfume  Remove any nail coloring.  It will interfere with some of our monitoring equipment.  NOTE: Remember that this is not meant to be interpreted as a complete list of all possible complications.  Unforeseen problems may occur.  BLOOD THINNERS The following drugs  contain aspirin or other products, which can cause increased bleeding during surgery and should not be taken for 2 weeks prior to and 1 week after surgery.  If you should need take something for relief of minor pain, you may take acetaminophen which is found in Tylenol,m Datril, Anacin-3 and Panadol. It is not blood thinner. The products listed below are.  Do not take any of the products listed below in addition to any listed on your instruction sheet.  A.P.C or A.P.C with Codeine Codeine Phosphate Capsules #3 Ibuprofen Ridaura  ABC compound Congesprin Imuran rimadil  Advil Cope Indocin Robaxisal  Alka-Seltzer Effervescent Pain Reliever and Antacid Coricidin or Coricidin-D  Indomethacin Rufen    Alka-Seltzer plus Cold Medicine Cosprin Ketoprofen S-A-C Tablets  Anacin Analgesic Tablets or Capsules Coumadin Korlgesic Salflex  Anacin Extra Strength Analgesic tablets or capsules CP-2 Tablets Lanoril Salicylate  Anaprox Cuprimine Capsules Levenox Salocol  Anexsia-D Dalteparin Magan Salsalate  Anodynos Darvon compound Magnesium Salicylate Sine-off  Ansaid Dasin Capsules Magsal Sodium Salicylate  Anturane Depen Capsules Marnal Soma  APF Arthritis pain formula Dewitt's Pills Measurin Stanback  Argesic Dia-Gesic Meclofenamic Sulfinpyrazone  Arthritis Bayer Timed Release Aspirin Diclofenac Meclomen Sulindac  Arthritis pain formula Anacin Dicumarol Medipren Supac  Analgesic (Safety coated) Arthralgen Diffunasal Mefanamic Suprofen  Arthritis Strength Bufferin Dihydrocodeine Mepro Compound Suprol  Arthropan liquid Dopirydamole Methcarbomol with Aspirin Synalgos  ASA tablets/Enseals Disalcid Micrainin Tagament  Ascriptin Doan's Midol Talwin  Ascriptin A/D Dolene Mobidin Tanderil  Ascriptin Extra Strength Dolobid Moblgesic Ticlid  Ascriptin with Codeine Doloprin or Doloprin with Codeine Momentum Tolectin  Asperbuf Duoprin Mono-gesic Trendar  Aspergum Duradyne Motrin or Motrin IB Triminicin  Aspirin  plain, buffered or enteric coated Durasal Myochrisine Trigesic  Aspirin Suppositories Easprin Nalfon Trillsate  Aspirin with Codeine Ecotrin Regular or Extra Strength Naprosyn Uracel  Atromid-S Efficin Naproxen Ursinus  Auranofin Capsules Elmiron Neocylate Vanquish  Axotal Emagrin Norgesic Verin  Azathioprine Empirin or Empirin with Codeine Normiflo Vitamin E  Azolid Emprazil Nuprin Voltaren  Bayer Aspirin plain, buffered or children's or timed BC Tablets or powders Encaprin Orgaran Warfarin Sodium  Buff-a-Comp Enoxaparin Orudis Zorpin  Buff-a-Comp with Codeine Equegesic Os-Cal-Gesic   Buffaprin Excedrin plain, buffered or Extra Strength Oxalid   Bufferin Arthritis Strength Feldene Oxphenbutazone   Bufferin plain or Extra Strength Feldene Capsules Oxycodone with Aspirin   Bufferin with Codeine Fenoprofen Fenoprofen Pabalate or Pabalate-SF   Buffets II Flogesic Panagesic   Buffinol plain or Extra Strength Florinal or Florinal with Codeine Panwarfarin   Buf-Tabs Flurbiprofen Penicillamine   Butalbital Compound Four-way cold tablets Penicillin   Butazolidin Fragmin Pepto-Bismol   Carbenicillin Geminisyn Percodan   Carna Arthritis Reliever Geopen Persantine   Carprofen Gold's salt Persistin   Chloramphenicol Goody's Phenylbutazone   Chloromycetin Haltrain Piroxlcam   Clmetidine heparin Plaquenil   Cllnoril Hyco-pap Ponstel   Clofibrate Hydroxy chloroquine Propoxyphen         Before stopping any of these medications, be sure to consult the physician who ordered them.  Some, such as Coumadin (Warfarin) are ordered to prevent or treat serious conditions such as "deep thrombosis", "pumonary embolisms", and other heart problems.  The amount of time that you may need off of the medication may also vary with the medication and the reason for which you were taking it.  If you are taking any of these medications, please make sure you notify your pain physician before you undergo any  procedures.         Epidural Steroid Injection Patient Information  Description: The epidural space surrounds the nerves as they exit the spinal cord.  In some patients, the nerves can be compressed and inflamed by a bulging disc or a tight spinal canal (spinal stenosis).  By injecting steroids into the epidural space, we can bring irritated nerves into direct contact with a potentially helpful medication.  These steroids act directly on the irritated nerves and can reduce swelling and inflammation which often leads to decreased pain.  Epidural steroids may be injected anywhere along the spine and from the neck to the low back depending upon the location of your pain.   After numbing the skin with local anesthetic (like Novocaine), a small needle is passed   into the epidural space slowly.  You may experience a sensation of pressure while this is being done.  The entire block usually last less than 10 minutes.  Conditions which may be treated by epidural steroids:   Low back and leg pain  Neck and arm pain  Spinal stenosis  Post-laminectomy syndrome  Herpes zoster (shingles) pain  Pain from compression fractures  Preparation for the injection:  1. Do not eat any solid food or dairy products within 8 hours of your appointment.  2. You may drink clear liquids up to 3 hours before appointment.  Clear liquids include water, black coffee, juice or soda.  No milk or cream please. 3. You may take your regular medication, including pain medications, with a sip of water before your appointment  Diabetics should hold regular insulin (if taken separately) and take 1/2 normal NPH dos the morning of the procedure.  Carry some sugar containing items with you to your appointment. 4. A driver must accompany you and be prepared to drive you home after your procedure.  5. Bring all your current medications with your. 6. An IV may be inserted and sedation may be given at the discretion of the  physician.   7. A blood pressure cuff, EKG and other monitors will often be applied during the procedure.  Some patients may need to have extra oxygen administered for a short period. 8. You will be asked to provide medical information, including your allergies, prior to the procedure.  We must know immediately if you are taking blood thinners (like Coumadin/Warfarin)  Or if you are allergic to IV iodine contrast (dye). We must know if you could possible be pregnant.  Possible side-effects:  Bleeding from needle site  Infection (rare, may require surgery)  Nerve injury (rare)  Numbness & tingling (temporary)  Difficulty urinating (rare, temporary)  Spinal headache ( a headache worse with upright posture)  Light -headedness (temporary)  Pain at injection site (several days)  Decreased blood pressure (temporary)  Weakness in arm/leg (temporary)  Pressure sensation in back/neck (temporary)  Call if you experience:  Fever/chills associated with headache or increased back/neck pain.  Headache worsened by an upright position.  New onset weakness or numbness of an extremity below the injection site  Hives or difficulty breathing (go to the emergency room)  Inflammation or drainage at the infection site  Severe back/neck pain  Any new symptoms which are concerning to you  Please note:  Although the local anesthetic injected can often make your back or neck feel good for several hours after the injection, the pain will likely return.  It takes 3-7 days for steroids to work in the epidural space.  You may not notice any pain relief for at least that one week.  If effective, we will often do a series of three injections spaced 3-6 weeks apart to maximally decrease your pain.  After the initial series, we generally will wait several months before considering a repeat injection of the same type.  If you have any questions, please call [phone] [facility] 

## 2016-09-15 NOTE — Progress Notes (Signed)
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 Transferred to rehab after 7 days and just discharged today.

## 2016-09-15 NOTE — Progress Notes (Signed)
Subjective:  Patient ID: Whitney Cline, female    DOB: 05-19-1946  Age: 71 y.o. MRN: [id number]  CC: Shoulder Pain (right) and Back Pain (low)  Procedure: Right side posterior deltoid and trapezius trigger point injection 1  HPI Whitney Cline returns for evaluation. She was last seen approximately 2 months ago and has unfortunately been hospitalized from January 18 through recent secondary to her cirrhosis causing some portal hypertension and upper GI bleeding symptoms. These have improved at this time. Her primary complaint today reflects back upon her low back pain and right posterior shoulder pain. At this point she is describing a pain that starts in the right posterior shoulder with radiation into the fingers of the right hand and occasionally reflecting back into the right posterior neck with associated tension like headaches occasionally. Otherwise at this point she is in her usual state of health taking her medications as prescribed with no associated problems based on her narcotic assessment sheet. She continues to drive good benefit from those in her overall lifestyle and function. Furthermore we have reviewed the practitioner database from [facility] and it is appropriate.   On history She is referred by her physician Dr. Iona Beard at [facility] for evaluation and management of her low back pain neck pain and right shoulder pain. She's had these complaints for several months and describes them as an aching and annoying dull often disabling pain that does not appear to be influenced by time of day. She has received opioids for management in the past by the pain control Center at [facility] and these were beneficial. Furthermore she had epidural steroid injections for her low back pain but these were ineffective and caused a significant transient increase in her blood sugar which at one point required hospitalization to get her blood sugar under control for 1 week. She is referred by her  primary care team to the pain control Center for management of her opioid medications and our assistance. She states that she has had previous x-rays and evaluations in the past.   Cervical CT reveals evidence of multilevel degenerative disc disease and facet arthropathy Lumbar MRI: None on record   History Whitney Cline has a past medical history of Anemia; Arthritis; Asthma; Cancer ([place]); CHF (congestive heart failure) ([place]); Chronic kidney disease; COPD (chronic obstructive pulmonary disease) ([place]); Diabetes mellitus without complication ([place]); Encounter for blood transfusion; Hepatitis C; Hypertension; Obesity; Oxygen deficiency; Shingles; and Sleep apnea.   She has a past surgical history that includes Abdominal hysterectomy and Coronary angioplasty with stent (N/A, 2007).   Her family history includes Diabetes in her father; Heart disease in her brother and mother.She reports that she quit smoking about 5 years ago. Her smoking use included Cigarettes. She has never used smokeless tobacco. She reports that she does not drink alcohol or use drugs.   ---------------------------------------------------------------------------------------------------------------------- Past Medical History:  Diagnosis Date  . Anemia   . Arthritis   . Asthma   . Cancer [facility])    liver ca- had ablation cancer free now  . CHF (congestive heart failure) ([place])   . Chronic kidney disease   . COPD (chronic obstructive pulmonary disease) ([place])   . Diabetes mellitus without complication ([place])   . Encounter for blood transfusion   . Hepatitis C   . Hypertension   . Obesity   . Oxygen deficiency   . Shingles    2017  . Sleep apnea     Past Surgical History:  Procedure Laterality Date  .  ABDOMINAL HYSTERECTOMY    . CORONARY ANGIOPLASTY WITH STENT PLACEMENT N/A 2007    Family History  Problem Relation Age of Onset  . Heart disease Mother   . Diabetes Father   . Heart disease Brother     Social History   Substance Use Topics  . Smoking status: Former Smoker    Types: Cigarettes    Quit date: 06/22/2011  . Smokeless tobacco: Never Used  . Alcohol use No    ---------------------------------------------------------------------------------------------------------------------- Social History   Social History  . Marital status: Single    Spouse name: N/A  . Number of children: N/A  . Years of education: N/A   Social History Main Topics  . Smoking status: Former Smoker    Types: Cigarettes    Quit date: 06/22/2011  . Smokeless tobacco: Never Used  . Alcohol use No  . Drug use: No  . Sexual activity: Not Asked   Other Topics Concern  . None   Social History Narrative  . None      ----------------------------------------------------------------------------------------------------------------------  ROS Review of Systems  Cardiac: No chest pain GI no constipation  Objective:  BP (!) 145/62   Pulse 71   Temp 97.1 F (36.2 C) (Oral)   Resp 18   Ht 5\' 6"  (1.676 m)   Wt (!) 315 lb (142.9 kg)   SpO2 99%   BMI 50.84 kg/m   Physical Exam  Patient is alert and oriented 3 cooperative compliant a good historian Pupils are equally round reactive to light and EOMI Heart is regular rate and rhythm with distant heart sounds and no audible murmur Lungs are clear She has a right posterior trapezius trigger point on examination that does cause referring pain into the right posterior arm and down into the hand.  Assessment & Plan:   Whitney Cline was seen today for shoulder pain and back pain.  Diagnoses and all orders for this visit:  Cervicalgia -     TRIGGER POINT INJECTION; Future -     dexamethasone (DECADRON) injection 10 mg; 2.5 mLs (10 mg total) by Other route once. -     ropivacaine (PF) 2 mg/mL (0.2%) (NAROPIN) injection 10 mL; 10 mLs by Epidural route once.  Facet arthritis of lumbar region ([place])  Low back pain at multiple sites  Other orders -     Discontinue:  oxyCODONE (OXY IR/ROXICODONE) 5 MG immediate release tablet; Take 1 tablet (5 mg total) by mouth 3 (three) times daily. -     oxyCODONE (OXY IR/ROXICODONE) 5 MG immediate release tablet; Take 1 tablet (5 mg total) by mouth 3 (three) times daily. -     ropivacaine (PF) 2 mg/mL (0.2%) (NAROPIN) 2 MG/ML injection;  -     dexamethasone (DECADRON) 4 MG/ML injection;  -     Discontinue: lidocaine (PF) (XYLOCAINE) 1 % injection;      ----------------------------------------------------------------------------------------------------------------------  Problem List Items Addressed This Visit    None    Visit Diagnoses    Cervicalgia    -  Primary   Relevant Medications   dexamethasone (DECADRON) injection 10 mg   ropivacaine (PF) 2 mg/mL (0.2%) (NAROPIN) injection 10 mL   Other Relevant Orders   TRIGGER POINT INJECTION   Facet arthritis of lumbar region [facility])       Relevant Medications   oxyCODONE (OXY IR/ROXICODONE) 5 MG immediate release tablet   dexamethasone (DECADRON) injection 10 mg   dexamethasone (DECADRON) 4 MG/ML injection   Low back pain at multiple sites  Relevant Medications   oxyCODONE (OXY IR/ROXICODONE) 5 MG immediate release tablet   dexamethasone (DECADRON) injection 10 mg   dexamethasone (DECADRON) 4 MG/ML injection      ----------------------------------------------------------------------------------------------------------------------  1. DDD (degenerative disc disease), lumbar  Positive for oxycodone and metabolites as expected. We will refill her medications with return to clinic in 2 months  2. Facet arthritis of lumbar region  Continue back stretching strengthening exercises as tolerated  3. Anemia, iron deficiency  -   4. Cervicalgia We will proceed with a trigger point injection to the right posterior trapezius today. Risks and benefits of an reviewed with her in full detail and she is to return to clinic in 1-2 months for reevaluation. We  have also reviewed the MRI that she presents showing multiple levels of lumbar degenerative disc disease.  5. Low back derangement syndrome We will defer on any repeat injections today   ----------------------------------------------------------------------------------------------------------------------  I am having Ms. Shreiner maintain her albuterol, atorvastatin, budesonide-formoterol, carvedilol, cetirizine, cholecalciferol, colestipol, ferrous sulfate, furosemide, gabapentin, metFORMIN, naloxone, quinapril, Sofosbuvir-Velpatasvir, spironolactone, tiotropium, liraglutide, methocarbamol, glucose blood, albuterol, azelastine, azelastine, FIFTY50 GLUCOSE METER 2.0, canagliflozin, ACCU-CHEK AVIVA PLUS, hydrOXYzine, hydrOXYzine, hydrOXYzine, FLUZONE HIGH-DOSE, lidocaine, metFORMIN, Pen Needles, triamcinolone cream, triamcinolone cream, triamcinolone cream, albuterol, insulin lispro, accu-chek multiclix, carvedilol, cetirizine, Vitamin D (Cholecalciferol), colestipol, ferrous sulfate, furosemide, gabapentin, metFORMIN, Naloxone HCl, budesonide-formoterol, tiotropium, liraglutide, and oxyCODONE. We will continue to administer dexamethasone, ropivacaine (PF) 2 mg/mL (0.2%), dexamethasone, and ropivacaine (PF) 2 mg/mL (0.2%).   Meds ordered this encounter  Medications  . DISCONTD: oxyCODONE (OXY IR/ROXICODONE) 5 MG immediate release tablet    Sig: Take 1 tablet (5 mg total) by mouth 3 (three) times daily.    Dispense:  75 tablet    Refill:  0  . oxyCODONE (OXY IR/ROXICODONE) 5 MG immediate release tablet    Sig: Take 1 tablet (5 mg total) by mouth 3 (three) times daily.    Dispense:  75 tablet    Refill:  0    Do not fill until [id number]  . dexamethasone (DECADRON) injection 10 mg  . ropivacaine (PF) 2 mg/mL (0.2%) (NAROPIN) injection 10 mL  . ropivacaine (PF) 2 mg/mL (0.2%) (NAROPIN) 2 MG/ML injection    TICE, KORI: cabinet override  . dexamethasone (DECADRON) 4 MG/ML injection    TICE, KORI:  cabinet override  . DISCONTD: lidocaine (PF) (XYLOCAINE) 1 % injection    TICE, KORI: cabinet override   Follow-up: Return in about 2 months (around 11/13/2016) for evaluation, med refill.   Procedure: Right posterior trapezius trigger point injection. 1Trigger point injection: The area overlying the aforementioned trigger points were prepped with alcohol. They were then injected with a 25-gauge needle with 8 cc of ropivacaine 0.2% and Decadron 8 mg at each site after negative aspiration for heme. This was performed after informed consent was obtained and risks and benefits reviewed. She tolerated this procedure without difficulty and was convalesced and discharged to home in stable condition for follow-up as mentioned.  @James  Adams, MD@   Molli Barrows, MD

## 2016-09-16 ENCOUNTER — Telehealth: Payer: Self-pay | Admitting: *Deleted

## 2016-09-16 NOTE — Telephone Encounter (Signed)
Attempted to call for post procedure follow-up. No answer. 

## 2016-09-28 ENCOUNTER — Ambulatory Visit: Payer: Medicare Other | Admitting: Anesthesiology

## 2016-10-23 ENCOUNTER — Other Ambulatory Visit: Payer: Self-pay | Admitting: Anesthesiology

## 2016-11-03 ENCOUNTER — Telehealth: Payer: Self-pay

## 2016-11-03 NOTE — Telephone Encounter (Signed)
Tried to call pt and her son to give her a new Dr Appointment to see Dr Andree Elk I accidentally scheduled pt for the wrong doctor. I tried calling 3 times twice to pt and once to her son who does have an option for me to LVM and neither does pt.

## 2016-11-04 ENCOUNTER — Ambulatory Visit: Payer: Medicare Other | Admitting: Pain Medicine

## 2016-11-04 NOTE — Telephone Encounter (Signed)
Attempted to call for post procedure follow-up. Message left. 

## 2016-11-15 ENCOUNTER — Ambulatory Visit: Payer: Medicare Other | Attending: Anesthesiology | Admitting: Anesthesiology

## 2016-11-15 ENCOUNTER — Encounter: Payer: Self-pay | Admitting: Anesthesiology

## 2016-11-15 ENCOUNTER — Encounter (INDEPENDENT_AMBULATORY_CARE_PROVIDER_SITE_OTHER): Payer: Self-pay

## 2016-11-15 VITALS — BP 156/58 | HR 79 | Temp 98.2°F | Resp 16 | Ht 66.0 in | Wt 291.0 lb

## 2016-11-15 DIAGNOSIS — N189 Chronic kidney disease, unspecified: Secondary | ICD-10-CM | POA: Diagnosis not present

## 2016-11-15 DIAGNOSIS — J449 Chronic obstructive pulmonary disease, unspecified: Secondary | ICD-10-CM | POA: Insufficient documentation

## 2016-11-15 DIAGNOSIS — Z9889 Other specified postprocedural states: Secondary | ICD-10-CM | POA: Insufficient documentation

## 2016-11-15 DIAGNOSIS — B1920 Unspecified viral hepatitis C without hepatic coma: Secondary | ICD-10-CM | POA: Insufficient documentation

## 2016-11-15 DIAGNOSIS — E669 Obesity, unspecified: Secondary | ICD-10-CM | POA: Insufficient documentation

## 2016-11-15 DIAGNOSIS — E1122 Type 2 diabetes mellitus with diabetic chronic kidney disease: Secondary | ICD-10-CM | POA: Diagnosis not present

## 2016-11-15 DIAGNOSIS — Z833 Family history of diabetes mellitus: Secondary | ICD-10-CM | POA: Insufficient documentation

## 2016-11-15 DIAGNOSIS — D509 Iron deficiency anemia, unspecified: Secondary | ICD-10-CM | POA: Insufficient documentation

## 2016-11-15 DIAGNOSIS — M25511 Pain in right shoulder: Secondary | ICD-10-CM | POA: Diagnosis not present

## 2016-11-15 DIAGNOSIS — I509 Heart failure, unspecified: Secondary | ICD-10-CM | POA: Diagnosis not present

## 2016-11-15 DIAGNOSIS — Z8249 Family history of ischemic heart disease and other diseases of the circulatory system: Secondary | ICD-10-CM | POA: Diagnosis not present

## 2016-11-15 DIAGNOSIS — Z6841 Body Mass Index (BMI) 40.0 and over, adult: Secondary | ICD-10-CM | POA: Diagnosis not present

## 2016-11-15 DIAGNOSIS — M5136 Other intervertebral disc degeneration, lumbar region: Secondary | ICD-10-CM

## 2016-11-15 DIAGNOSIS — M542 Cervicalgia: Secondary | ICD-10-CM | POA: Diagnosis not present

## 2016-11-15 DIAGNOSIS — G4730 Sleep apnea, unspecified: Secondary | ICD-10-CM | POA: Insufficient documentation

## 2016-11-15 DIAGNOSIS — Z90710 Acquired absence of both cervix and uterus: Secondary | ICD-10-CM | POA: Diagnosis not present

## 2016-11-15 DIAGNOSIS — M549 Dorsalgia, unspecified: Secondary | ICD-10-CM | POA: Insufficient documentation

## 2016-11-15 DIAGNOSIS — I130 Hypertensive heart and chronic kidney disease with heart failure and stage 1 through stage 4 chronic kidney disease, or unspecified chronic kidney disease: Secondary | ICD-10-CM | POA: Insufficient documentation

## 2016-11-15 DIAGNOSIS — Z0001 Encounter for general adult medical examination with abnormal findings: Secondary | ICD-10-CM | POA: Diagnosis present

## 2016-11-15 DIAGNOSIS — M5386 Other specified dorsopathies, lumbar region: Secondary | ICD-10-CM

## 2016-11-15 DIAGNOSIS — M545 Low back pain, unspecified: Secondary | ICD-10-CM

## 2016-11-15 DIAGNOSIS — Z87891 Personal history of nicotine dependence: Secondary | ICD-10-CM | POA: Diagnosis not present

## 2016-11-15 MED ORDER — OXYCODONE HCL 5 MG PO TABS: 5.0000 mg | ORAL_TABLET | Freq: Three times a day (TID) | ORAL | 0 refills | Status: DC

## 2016-11-15 MED ORDER — METHOCARBAMOL 750 MG PO TABS: 750.0000 mg | ORAL_TABLET | Freq: Three times a day (TID) | ORAL | 2 refills | Status: DC | PRN

## 2016-11-15 NOTE — Progress Notes (Signed)
Nursing Pain Medication Assessment:  Safety precautions to be maintained throughout the outpatient stay will include: orient to surroundings, keep bed in low position, maintain call bell within reach at all times, provide assistance with transfer out of bed and ambulation.  Medication Inspection Compliance: Ms. Gesell did not comply with our request to bring her pills to be counted. She was reminded that bringing the medication bottles, even when empty, is a requirement. Pill/Patch Count: None available to be counted. Bottle Appearance: No container available. Did not bring bottle(s) to appointment. Medication: None brought in. Filled Date: N/A Last Medication intake:  Today

## 2016-11-16 NOTE — Progress Notes (Signed)
Subjective:  Patient ID: Whitney Cline, female    DOB: 02/20/1946  Age: 71 y.o. MRN: [id number]  CC: Back Pain (lower) and Shoulder Pain (right)  Procedure: Non Previous Procedure: Right side posterior deltoid and trapezius trigger point injection 1  HPI Whitney Cline presents for reevaluation. She has been doing well with her neck and back pain. She is status post a recent right deltoid and trapezius trigger point which have responded favorably. She states that the quality characteristic addition we should've her pain a been stable but improved in severity and frequency. Based on her narcotic assessment sheet she is tolerating her medications well and using them appropriately. There is no evidence of any diverting or illicit use mentioned today.  On history She is referred by her physician Dr. Iona Beard at [facility] for evaluation and management of her Cline back pain neck pain and right shoulder pain. She's had these complaints for several months and describes them as an aching and annoying dull often disabling pain that does not appear to be influenced by time of day. She has received opioids for management in the past by the pain control Center at [facility] and these were beneficial. Furthermore she had epidural steroid injections for her Cline back pain but these were ineffective and caused a significant transient increase in her blood sugar which at one point required hospitalization to get her blood sugar under control for 1 week. She is referred by her primary care team to the pain control Center for management of her opioid medications and our assistance. She states that she has had previous x-rays and evaluations in the past.   Cervical CT reveals evidence of multilevel degenerative disc disease and facet arthropathy Lumbar MRI: None on record   History Whitney Cline has a past medical history of Anemia; Arthritis; Asthma; Cancer ([place]); CHF (congestive heart failure) ([place]); Chronic kidney disease;  COPD (chronic obstructive pulmonary disease) ([place]); Diabetes mellitus without complication ([place]); Encounter for blood transfusion; Hepatitis C; Hypertension; Obesity; Oxygen deficiency; Shingles; and Sleep apnea.   She has a past surgical history that includes Abdominal hysterectomy and Coronary angioplasty with stent (N/A, 2007).   Her family history includes Diabetes in her father; Heart disease in her brother and mother.She reports that she quit smoking about 5 years ago. Her smoking use included Cigarettes. She has never used smokeless tobacco. She reports that she does not drink alcohol or use drugs.   ---------------------------------------------------------------------------------------------------------------------- Past Medical History:  Diagnosis Date  . Anemia   . Arthritis   . Asthma   . Cancer [facility])    liver ca- had ablation cancer free now  . CHF (congestive heart failure) ([employer])   . Chronic kidney disease   . COPD (chronic obstructive pulmonary disease) ([place])   . Diabetes mellitus without complication ([place])   . Encounter for blood transfusion   . Hepatitis C   . Hypertension   . Obesity   . Oxygen deficiency   . Shingles    2017  . Sleep apnea     Past Surgical History:  Procedure Laterality Date  . ABDOMINAL HYSTERECTOMY    . CORONARY ANGIOPLASTY WITH STENT PLACEMENT N/A 2007    Family History  Problem Relation Age of Onset  . Heart disease Mother   . Diabetes Father   . Heart disease Brother     Social History  Substance Use Topics  . Smoking status: Former Smoker    Types: Cigarettes    Quit date: 06/22/2011  .  Smokeless tobacco: Never Used  . Alcohol use No    ---------------------------------------------------------------------------------------------------------------------- Social History   Social History  . Marital status: Single    Spouse name: N/A  . Number of children: N/A  . Years of education: N/A   Social History Main Topics  .  Smoking status: Former Smoker    Types: Cigarettes    Quit date: 06/22/2011  . Smokeless tobacco: Never Used  . Alcohol use No  . Drug use: No  . Sexual activity: Not Asked   Other Topics Concern  . None   Social History Narrative  . None      ----------------------------------------------------------------------------------------------------------------------  ROS Review of Systems  Cardiac: No chest pain GI no constipation  Objective:  BP (!) 156/58   Pulse 79   Temp 98.2 F (36.8 C) (Oral)   Resp 16   Ht 5\' 6"  (1.676 m)   Wt 291 lb (132 kg)   SpO2 95%   BMI 46.97 kg/m   Physical Exam  Patient is alert and oriented 3 cooperative compliant a good historian Pupils are equally round reactive to light and EOMI Heart is regular rate and rhythm with distant heart sounds and no audible murmur Lungs are clear She is having less tenderness in the trapezius and deltoid on examination. Muscle tone and bulk is good  Assessment & Plan:   Whitney Cline was seen today for back pain and shoulder pain.  Diagnoses and all orders for this visit:  Cline back pain at multiple sites  Cline back derangement syndrome  Cervicalgia  DDD (degenerative disc disease), lumbar  Pain in joint of right shoulder  Other orders -     Discontinue: oxyCODONE (OXY IR/ROXICODONE) 5 MG immediate release tablet; Take 1 tablet (5 mg total) by mouth 3 (three) times daily. -     oxyCODONE (OXY IR/ROXICODONE) 5 MG immediate release tablet; Take 1 tablet (5 mg total) by mouth 3 (three) times daily. -     methocarbamol (ROBAXIN) 750 MG tablet; Take 1 tablet (750 mg total) by mouth every 8 (eight) hours as needed for muscle spasms.     ----------------------------------------------------------------------------------------------------------------------  Problem List Items Addressed This Visit    None    Visit Diagnoses    Cline back pain at multiple sites    -  Primary   Relevant Medications    oxyCODONE (OXY IR/ROXICODONE) 5 MG immediate release tablet   methocarbamol (ROBAXIN) 750 MG tablet   Cline back derangement syndrome       Relevant Medications   oxyCODONE (OXY IR/ROXICODONE) 5 MG immediate release tablet   methocarbamol (ROBAXIN) 750 MG tablet   Cervicalgia       DDD (degenerative disc disease), lumbar       Relevant Medications   oxyCODONE (OXY IR/ROXICODONE) 5 MG immediate release tablet   methocarbamol (ROBAXIN) 750 MG tablet   Pain in joint of right shoulder          ----------------------------------------------------------------------------------------------------------------------  1. DDD (degenerative disc disease), lumbar  Positive for oxycodone and metabolites as expected. We will refill her medications with return to clinic in 2 months. She is currently stable with her regimen. We've reviewed the [facility] practitioner database and it is appropriate as well.  2. Facet arthritis of lumbar region  Continue back stretching strengthening exercises as tolerated  3. Anemia, iron deficiency  -   4. Cervicalgia Continue with stretching strengthening exercises 5. Cline back derangement syndrome We will defer on any repeat injections today   ----------------------------------------------------------------------------------------------------------------------  I have discontinued Whitney Cline's atorvastatin, colestipol, quinapril, Sofosbuvir-Velpatasvir, spironolactone, canagliflozin, FLUZONE HIGH-DOSE, insulin lispro, Vitamin D (Cholecalciferol), colestipol, cephALEXin, and methocarbamol. I have also changed her methocarbamol. Additionally, I am having her maintain her albuterol, budesonide-formoterol, carvedilol, cetirizine, cholecalciferol, ferrous sulfate, furosemide, gabapentin, metFORMIN, naloxone, tiotropium, glucose blood, albuterol, azelastine, FIFTY50 GLUCOSE METER 2.0, ACCU-CHEK AVIVA PLUS, hydrOXYzine, lidocaine, Pen Needles, triamcinolone cream,  accu-chek multiclix, ferrous sulfate, Naloxone HCl, liraglutide, and oxyCODONE. We will continue to administer dexamethasone, ropivacaine (PF) 2 mg/mL (0.2%), dexamethasone, and ropivacaine (PF) 2 mg/mL (0.2%).   Meds ordered this encounter  Medications  . DISCONTD: cephALEXin (KEFLEX) 500 MG capsule  . DISCONTD: azelastine (ASTELIN) 0.1 % nasal spray    Sig: Place into the nose.  Marland Kitchen DISCONTD: budesonide-formoterol (SYMBICORT) 160-4.5 MCG/ACT inhaler    Sig: Inhale into the lungs.  Marland Kitchen DISCONTD: cetirizine (ZYRTEC) 10 MG tablet    Sig: Take by mouth.  . DISCONTD: ferrous sulfate 325 (65 FE) MG EC tablet    Sig: Take by mouth.  . DISCONTD: furosemide (LASIX) 20 MG tablet    Sig: Take by mouth.  . DISCONTD: gabapentin (NEURONTIN) 300 MG capsule    Sig: Take by mouth.  . DISCONTD: hydrOXYzine (ATARAX/VISTARIL) 25 MG tablet    Sig: Take by mouth.  . DISCONTD: metFORMIN (GLUCOPHAGE-XR) 500 MG 24 hr tablet    Sig: Take by mouth.  . DISCONTD: methocarbamol (ROBAXIN) 750 MG tablet  . DISCONTD: oxyCODONE (OXY IR/ROXICODONE) 5 MG immediate release tablet    Sig: Take 5 mg by mouth.  . DISCONTD: tiotropium (SPIRIVA) 18 MCG inhalation capsule    Sig: Place into inhaler and inhale.  Marland Kitchen DISCONTD: triamcinolone cream (KENALOG) 0.5 %    Sig: Apply topically.  Marland Kitchen DISCONTD: liraglutide (VICTOZA) 18 MG/3ML SOPN    Sig: Inject into the skin.  Marland Kitchen DISCONTD: oxyCODONE (OXY IR/ROXICODONE) 5 MG immediate release tablet    Sig: Take 1 tablet (5 mg total) by mouth 3 (three) times daily.    Dispense:  75 tablet    Refill:  0  . oxyCODONE (OXY IR/ROXICODONE) 5 MG immediate release tablet    Sig: Take 1 tablet (5 mg total) by mouth 3 (three) times daily.    Dispense:  75 tablet    Refill:  0    Do not fill until [id number]  . methocarbamol (ROBAXIN) 750 MG tablet    Sig: Take 1 tablet (750 mg total) by mouth every 8 (eight) hours as needed for muscle spasms.    Dispense:  60 tablet    Refill:  2   Follow-up:  Return in about 2 months (around 01/15/2017) for evaluation, med refill.     Molli Barrows, MD

## 2016-11-26 ENCOUNTER — Other Ambulatory Visit: Payer: Self-pay | Admitting: Anesthesiology

## 2016-12-29 ENCOUNTER — Other Ambulatory Visit: Payer: Self-pay | Admitting: Anesthesiology

## 2017-01-03 ENCOUNTER — Ambulatory Visit: Payer: Medicare Other | Attending: Anesthesiology | Admitting: Anesthesiology

## 2017-01-03 ENCOUNTER — Encounter: Payer: Self-pay | Admitting: Anesthesiology

## 2017-01-03 VITALS — BP 164/70 | HR 80 | Temp 98.1°F | Resp 16 | Ht 66.0 in | Wt 283.0 lb

## 2017-01-03 DIAGNOSIS — I509 Heart failure, unspecified: Secondary | ICD-10-CM | POA: Diagnosis not present

## 2017-01-03 DIAGNOSIS — M5136 Other intervertebral disc degeneration, lumbar region: Secondary | ICD-10-CM | POA: Diagnosis not present

## 2017-01-03 DIAGNOSIS — G4730 Sleep apnea, unspecified: Secondary | ICD-10-CM | POA: Diagnosis not present

## 2017-01-03 DIAGNOSIS — Z955 Presence of coronary angioplasty implant and graft: Secondary | ICD-10-CM | POA: Diagnosis not present

## 2017-01-03 DIAGNOSIS — G894 Chronic pain syndrome: Secondary | ICD-10-CM | POA: Diagnosis not present

## 2017-01-03 DIAGNOSIS — Z833 Family history of diabetes mellitus: Secondary | ICD-10-CM | POA: Insufficient documentation

## 2017-01-03 DIAGNOSIS — M4696 Unspecified inflammatory spondylopathy, lumbar region: Secondary | ICD-10-CM | POA: Insufficient documentation

## 2017-01-03 DIAGNOSIS — M545 Low back pain, unspecified: Secondary | ICD-10-CM

## 2017-01-03 DIAGNOSIS — J449 Chronic obstructive pulmonary disease, unspecified: Secondary | ICD-10-CM | POA: Diagnosis not present

## 2017-01-03 DIAGNOSIS — M47816 Spondylosis without myelopathy or radiculopathy, lumbar region: Secondary | ICD-10-CM

## 2017-01-03 DIAGNOSIS — E669 Obesity, unspecified: Secondary | ICD-10-CM | POA: Insufficient documentation

## 2017-01-03 DIAGNOSIS — Z87891 Personal history of nicotine dependence: Secondary | ICD-10-CM | POA: Diagnosis not present

## 2017-01-03 DIAGNOSIS — Z90710 Acquired absence of both cervix and uterus: Secondary | ICD-10-CM | POA: Diagnosis not present

## 2017-01-03 DIAGNOSIS — Z8619 Personal history of other infectious and parasitic diseases: Secondary | ICD-10-CM | POA: Diagnosis not present

## 2017-01-03 DIAGNOSIS — M542 Cervicalgia: Secondary | ICD-10-CM

## 2017-01-03 DIAGNOSIS — I130 Hypertensive heart and chronic kidney disease with heart failure and stage 1 through stage 4 chronic kidney disease, or unspecified chronic kidney disease: Secondary | ICD-10-CM | POA: Diagnosis not present

## 2017-01-03 DIAGNOSIS — Z8505 Personal history of malignant neoplasm of liver: Secondary | ICD-10-CM | POA: Diagnosis not present

## 2017-01-03 DIAGNOSIS — D509 Iron deficiency anemia, unspecified: Secondary | ICD-10-CM | POA: Insufficient documentation

## 2017-01-03 DIAGNOSIS — Z6841 Body Mass Index (BMI) 40.0 and over, adult: Secondary | ICD-10-CM | POA: Diagnosis not present

## 2017-01-03 DIAGNOSIS — Z8249 Family history of ischemic heart disease and other diseases of the circulatory system: Secondary | ICD-10-CM | POA: Diagnosis not present

## 2017-01-03 DIAGNOSIS — N189 Chronic kidney disease, unspecified: Secondary | ICD-10-CM | POA: Insufficient documentation

## 2017-01-03 DIAGNOSIS — E1122 Type 2 diabetes mellitus with diabetic chronic kidney disease: Secondary | ICD-10-CM | POA: Diagnosis not present

## 2017-01-03 DIAGNOSIS — M25511 Pain in right shoulder: Secondary | ICD-10-CM | POA: Diagnosis not present

## 2017-01-03 MED ORDER — OXYCODONE HCL 5 MG PO TABS: 5.0000 mg | ORAL_TABLET | Freq: Three times a day (TID) | ORAL | 0 refills | Status: DC

## 2017-01-03 MED ORDER — CYCLOBENZAPRINE HCL 5 MG PO TABS: 5.0000 mg | ORAL_TABLET | Freq: Three times a day (TID) | ORAL | 2 refills | Status: DC | PRN

## 2017-01-03 MED ORDER — CARISOPRODOL 350 MG PO TABS: 350.0000 mg | ORAL_TABLET | Freq: Four times a day (QID) | ORAL | 5 refills | Status: DC

## 2017-01-03 NOTE — Progress Notes (Signed)
Subjective:  Patient ID: Whitney Cline, female    DOB: 05-May-1946  Age: 71 y.o. MRN: [id number]  CC: Back Pain (lower)  Procedure: Non Previous Procedure: Right side posterior deltoid and trapezius trigger point injection 1  HPI Whitney Cline presents for reevaluation.She was last seen in April and has been doing well with her current medication regimen. She is presently taking one of her oxycodone tablets on average twice a day and occasionally using a third tablet on breakthrough days. She feels that this regimen has kept her pain under better average control. No untoward side effects are noted and she has been compliant with the regimen. We have reviewed the [facility] practitioner database and it is appropriate. Otherwise the quality distribution of her diffuse pain is stable.   On history She is referred by her physician Dr. Iona Beard at [facility] for evaluation and management of her low back pain neck pain and right shoulder pain. She's had these complaints for several months and describes them as an aching and annoying dull often disabling pain that does not appear to be influenced by time of day. She has received opioids for management in the past by the pain control Center at [facility] and these were beneficial. Furthermore she had epidural steroid injections for her low back pain but these were ineffective and caused a significant transient increase in her blood sugar which at one point required hospitalization to get her blood sugar under control for 1 week. She is referred by her primary care team to the pain control Center for management of her opioid medications and our assistance. She states that she has had previous x-rays and evaluations in the past.   Cervical CT reveals evidence of multilevel degenerative disc disease and facet arthropathy Lumbar MRI: None on record   History Whitney Cline has a past medical history of Anemia; Arthritis; Asthma; Cancer ([place]); CHF (congestive heart  failure) ([place]); Chronic kidney disease; COPD (chronic obstructive pulmonary disease) ([place]); Diabetes mellitus without complication ([place]); Encounter for blood transfusion; Hepatitis C; Hypertension; Obesity; Oxygen deficiency; Shingles; and Sleep apnea.   She has a past surgical history that includes Abdominal hysterectomy and Coronary angioplasty with stent (N/A, 2007).   Her family history includes Diabetes in her father; Heart disease in her brother and mother.She reports that she quit smoking about 5 years ago. Her smoking use included Cigarettes. She has never used smokeless tobacco. She reports that she does not drink alcohol or use drugs.   ---------------------------------------------------------------------------------------------------------------------- Past Medical History:  Diagnosis Date  . Anemia   . Arthritis   . Asthma   . Cancer [facility])    liver ca- had ablation cancer free now  . CHF (congestive heart failure) ([place])   . Chronic kidney disease   . COPD (chronic obstructive pulmonary disease) ([place])   . Diabetes mellitus without complication ([place])   . Encounter for blood transfusion   . Hepatitis C   . Hypertension   . Obesity   . Oxygen deficiency   . Shingles    2017  . Sleep apnea     Past Surgical History:  Procedure Laterality Date  . ABDOMINAL HYSTERECTOMY    . CORONARY ANGIOPLASTY WITH STENT PLACEMENT N/A 2007    Family History  Problem Relation Age of Onset  . Heart disease Mother   . Diabetes Father   . Heart disease Brother     Social History  Substance Use Topics  . Smoking status: Former Smoker    Types:  Cigarettes    Quit date: 06/22/2011  . Smokeless tobacco: Never Used  . Alcohol use No    ---------------------------------------------------------------------------------------------------------------------- Social History   Social History  . Marital status: Single    Spouse name: N/A  . Number of children: N/A  . Years of education:  N/A   Social History Main Topics  . Smoking status: Former Smoker    Types: Cigarettes    Quit date: 06/22/2011  . Smokeless tobacco: Never Used  . Alcohol use No  . Drug use: No  . Sexual activity: Not Asked   Other Topics Concern  . None   Social History Narrative  . None      ----------------------------------------------------------------------------------------------------------------------  ROS Review of Systems  Cardiac: No chest pain GI no constipation  Objective:  BP (!) 164/70   Pulse 80   Temp 98.1 F (36.7 C) (Oral)   Resp 16   Ht 5\' 6"  (1.676 m)   Wt 283 lb (128.4 kg)   SpO2 94% Comment: O2 at 3 L/min  BMI 45.68 kg/m   Physical Exam  Patient is alert and oriented 3 cooperative compliant a good historian Pupils are equally round reactive to light and EOMI Heart is regular rate and rhythm with distant heart sounds and no audible murmur Lungs are clear Her trapezius and deltoid bilaterally showed less tenderness overall.   Assessment & Plan:   Whitney Cline was seen today for back pain.  Diagnoses and all orders for this visit:  Chronic pain syndrome -     ToxASSURE Select 13 (MW), Urine  Low back pain at multiple sites  Cervicalgia  Facet arthritis of lumbar region (HCC)  Pain in joint of right shoulder  Other orders -     Discontinue: oxyCODONE (OXY IR/ROXICODONE) 5 MG immediate release tablet; Take 1 tablet (5 mg total) by mouth 3 (three) times daily. -     oxyCODONE (OXY IR/ROXICODONE) 5 MG immediate release tablet; Take 1 tablet (5 mg total) by mouth 3 (three) times daily. -     carisoprodol (SOMA) 350 MG tablet; Take 1 tablet (350 mg total) by mouth 4 (four) times daily. -     cyclobenzaprine (FLEXERIL) 5 MG tablet; Take 1 tablet (5 mg total) by mouth 3 (three) times daily as needed for muscle spasms.     ----------------------------------------------------------------------------------------------------------------------  Problem  List Items Addressed This Visit    None    Visit Diagnoses    Chronic pain syndrome    -  Primary   Relevant Orders   ToxASSURE Select 13 (MW), Urine   Low back pain at multiple sites       Relevant Medications   oxyCODONE (OXY IR/ROXICODONE) 5 MG immediate release tablet   carisoprodol (SOMA) 350 MG tablet   cyclobenzaprine (FLEXERIL) 5 MG tablet   Cervicalgia       Facet arthritis of lumbar region (HCC)       Relevant Medications   oxyCODONE (OXY IR/ROXICODONE) 5 MG immediate release tablet   carisoprodol (SOMA) 350 MG tablet   cyclobenzaprine (FLEXERIL) 5 MG tablet   Pain in joint of right shoulder          ----------------------------------------------------------------------------------------------------------------------  1. DDD (degenerative disc disease), lumbar  Positive for oxycodone and metabolites as expected. We will refill her medications with return to clinic in 2 months. She is currently stable with her regimen. We've reviewed the [facility] practitioner database and it is appropriate as well. Refills were given today for the next 2 months  with return to clinic at that time  2. Facet arthritis of lumbar region  Continue back stretching strengthening exercises as tolerated  3. Anemia, iron deficiency  -   4. Cervicalgia Continue with stretching strengthening exercises. Continue with physical therapy activities as these seem to have been helping her. I've encouraged her to follow-up in one of the local facilities. 5. Low back derangement syndrome We will defer on any repeat injections today   ----------------------------------------------------------------------------------------------------------------------  I have discontinued Ms. Hew's methocarbamol. I have also changed her methocarbamol to carisoprodol. Additionally, I am having her start on cyclobenzaprine. Lastly, I am having her maintain her albuterol, budesonide-formoterol, carvedilol,  cetirizine, cholecalciferol, furosemide, gabapentin, metFORMIN, naloxone, tiotropium, glucose blood, albuterol, azelastine, FIFTY50 GLUCOSE METER 2.0, ACCU-CHEK AVIVA PLUS, hydrOXYzine, lidocaine, Pen Needles, triamcinolone cream, accu-chek multiclix, ferrous sulfate, Naloxone HCl, liraglutide, and oxyCODONE. We will continue to administer dexamethasone, ropivacaine (PF) 2 mg/mL (0.2%), dexamethasone, and ropivacaine (PF) 2 mg/mL (0.2%).   Meds ordered this encounter  Medications  . DISCONTD: oxyCODONE (OXY IR/ROXICODONE) 5 MG immediate release tablet    Sig: Take 1 tablet (5 mg total) by mouth 3 (three) times daily.    Dispense:  75 tablet    Refill:  0    Do not fill until [id number]  . oxyCODONE (OXY IR/ROXICODONE) 5 MG immediate release tablet    Sig: Take 1 tablet (5 mg total) by mouth 3 (three) times daily.    Dispense:  75 tablet    Refill:  0    Do not fill until [id number]  . carisoprodol (SOMA) 350 MG tablet    Sig: Take 1 tablet (350 mg total) by mouth 4 (four) times daily.    Dispense:  30 tablet    Refill:  5  . cyclobenzaprine (FLEXERIL) 5 MG tablet    Sig: Take 1 tablet (5 mg total) by mouth 3 (three) times daily as needed for muscle spasms.    Dispense:  75 tablet    Refill:  2   Follow-up: Return in about 2 months (around 03/05/2017) for evaluation.     Molli Barrows, MD

## 2017-01-03 NOTE — Progress Notes (Signed)
Nursing Pain Medication Assessment:  Safety precautions to be maintained throughout the outpatient stay will include: orient to surroundings, keep bed in low position, maintain call bell within reach at all times, provide assistance with transfer out of bed and ambulation.  Medication Inspection Compliance: Pill count conducted under aseptic conditions, in front of the patient. Neither the pills nor the bottle was removed from the patient's sight at any time. Once count was completed pills were immediately returned to the patient in their original bottle.  Medication: Oxycodone IR Pill/Patch Count: 36 of 75 pills remain Pill/Patch Appearance: Markings consistent with prescribed medication Bottle Appearance: Standard pharmacy container. Clearly labeled. Filled Date: 05/17 / 2018 Last Medication intake:  Today

## 2017-01-03 NOTE — Patient Instructions (Signed)
You were given 2 prescriptions for Oxycodone today. A prescription for Whitney Cline, to last 6 months, was sent to your pharmacy.

## 2017-01-06 LAB — TOXASSURE SELECT 13 (MW), URINE

## 2017-02-15 MED FILL — XIFAXAN/550MG/TABS: XIFAXAN/550MG/TABS | 30 days supply | Qty: 60 | Fill #1

## 2017-02-16 ENCOUNTER — Ambulatory Visit
Admission: RE | Admit: 2017-02-16 | Discharge: 2017-02-16 | Disposition: A | Discharge status: Home / Self Care | Payer: MEDICARE

## 2017-02-16 DIAGNOSIS — R188 Other ascites: Secondary | ICD-10-CM

## 2017-02-16 DIAGNOSIS — K7460 Unspecified cirrhosis of liver: Principal | ICD-10-CM

## 2017-02-21 MED ORDER — CEPHALEXIN 250 MG CAPSULE
ORAL_CAPSULE | Freq: Four times a day (QID) | ORAL | 0 refills | 0 days | Status: CP
Start: 2017-02-21 — End: 2017-11-07

## 2017-03-01 ENCOUNTER — Ambulatory Visit: Payer: Medicare Other | Attending: Anesthesiology | Admitting: Anesthesiology

## 2017-03-01 ENCOUNTER — Encounter: Payer: Self-pay | Admitting: Anesthesiology

## 2017-03-01 VITALS — BP 155/72 | HR 87 | Temp 97.8°F | Resp 18 | Ht 66.5 in | Wt 280.0 lb

## 2017-03-01 DIAGNOSIS — I130 Hypertensive heart and chronic kidney disease with heart failure and stage 1 through stage 4 chronic kidney disease, or unspecified chronic kidney disease: Secondary | ICD-10-CM | POA: Diagnosis not present

## 2017-03-01 DIAGNOSIS — M4686 Other specified inflammatory spondylopathies, lumbar region: Secondary | ICD-10-CM | POA: Insufficient documentation

## 2017-03-01 DIAGNOSIS — M47816 Spondylosis without myelopathy or radiculopathy, lumbar region: Secondary | ICD-10-CM

## 2017-03-01 DIAGNOSIS — E669 Obesity, unspecified: Secondary | ICD-10-CM | POA: Diagnosis not present

## 2017-03-01 DIAGNOSIS — M545 Low back pain, unspecified: Secondary | ICD-10-CM

## 2017-03-01 DIAGNOSIS — G894 Chronic pain syndrome: Secondary | ICD-10-CM | POA: Diagnosis present

## 2017-03-01 DIAGNOSIS — D509 Iron deficiency anemia, unspecified: Secondary | ICD-10-CM

## 2017-03-01 DIAGNOSIS — M5136 Other intervertebral disc degeneration, lumbar region: Secondary | ICD-10-CM | POA: Insufficient documentation

## 2017-03-01 DIAGNOSIS — J449 Chronic obstructive pulmonary disease, unspecified: Secondary | ICD-10-CM | POA: Diagnosis not present

## 2017-03-01 DIAGNOSIS — R0902 Hypoxemia: Secondary | ICD-10-CM | POA: Diagnosis not present

## 2017-03-01 DIAGNOSIS — N189 Chronic kidney disease, unspecified: Secondary | ICD-10-CM | POA: Diagnosis not present

## 2017-03-01 DIAGNOSIS — M4696 Unspecified inflammatory spondylopathy, lumbar region: Secondary | ICD-10-CM | POA: Diagnosis not present

## 2017-03-01 DIAGNOSIS — I509 Heart failure, unspecified: Secondary | ICD-10-CM | POA: Diagnosis not present

## 2017-03-01 DIAGNOSIS — M25511 Pain in right shoulder: Secondary | ICD-10-CM

## 2017-03-01 DIAGNOSIS — M5386 Other specified dorsopathies, lumbar region: Secondary | ICD-10-CM | POA: Diagnosis not present

## 2017-03-01 DIAGNOSIS — Z7984 Long term (current) use of oral hypoglycemic drugs: Secondary | ICD-10-CM | POA: Diagnosis not present

## 2017-03-01 DIAGNOSIS — Z6841 Body Mass Index (BMI) 40.0 and over, adult: Secondary | ICD-10-CM | POA: Diagnosis not present

## 2017-03-01 DIAGNOSIS — M542 Cervicalgia: Secondary | ICD-10-CM

## 2017-03-01 DIAGNOSIS — Z87891 Personal history of nicotine dependence: Secondary | ICD-10-CM | POA: Diagnosis not present

## 2017-03-01 DIAGNOSIS — Z79899 Other long term (current) drug therapy: Secondary | ICD-10-CM | POA: Diagnosis not present

## 2017-03-01 DIAGNOSIS — E1122 Type 2 diabetes mellitus with diabetic chronic kidney disease: Secondary | ICD-10-CM | POA: Insufficient documentation

## 2017-03-01 MED ORDER — OXYCODONE HCL 5 MG PO TABS: 5.0000 mg | ORAL_TABLET | Freq: Three times a day (TID) | ORAL | 0 refills | Status: DC

## 2017-03-01 NOTE — Progress Notes (Signed)
Nursing Pain Medication Assessment:  Safety precautions to be maintained throughout the outpatient stay will include: orient to surroundings, keep bed in low position, maintain call bell within reach at all times, provide assistance with transfer out of bed and ambulation.  Medication Inspection Compliance: Pill count conducted under aseptic conditions, in front of the patient. Neither the pills nor the bottle was removed from the patient's sight at any time. Once count was completed pills were immediately returned to the patient in their original bottle.  Medication: Oxycodone IR Pill/Patch Count: 42 of 75 pills remain Pill/Patch Appearance: Markings consistent with prescribed medication Bottle Appearance: Standard pharmacy container. Clearly labeled. Filled Date: 07 / 16 / 2018 Last Medication intake:  Today

## 2017-03-02 NOTE — Progress Notes (Signed)
Subjective:  Patient ID: Whitney Cline, female    DOB: 10/12/1945  Age: 71 y.o. MRN: [id number]  CC: Back Pain (low) and Leg Pain (posterior thigh)  Procedure: None Previous Procedure: Right side posterior deltoid and trapezius trigger point injection 1  HPIDonna presents for reevaluation last seen in June. She continues to do well with her current medication management. The quality characteristic addition we should've her pain and states stable. No other changes are reported. She continues to have pain in the neck and shoulder and low back. The neck and shoulder pain has responded reasonably well to trigger point injections and her stretching strengthening exercises. This seems to be in reasonable control this point. She still having some low back pain of the same quality characteristic and distribution. His mainly in the low back with aching characteristics radiating into the lower legs. No change in lower extremity strength or function or bowel bladder function is noted furthermore she is taking her medications as prescribed and these are working well for her. I have reviewed her narcotic assessment sheet and she is deriving some improvement in lifestyle function with her medications.   On history She is referred by her physician Dr. Iona Cline at [facility] for evaluation and management of her low back pain neck pain and right shoulder pain. She's had these complaints for several months and describes them as an aching and annoying dull often disabling pain that does not appear to be influenced by time of day. She has received opioids for management in the past by the pain control Center at [facility] and these were beneficial. Furthermore she had epidural steroid injections for her low back pain but these were ineffective and caused a significant transient increase in her blood sugar which at one point required hospitalization to get her blood sugar under control for 1 week. She is referred by her primary care  team to the pain control Center for management of her opioid medications and our assistance. She states that she has had previous x-rays and evaluations in the past.   Cervical CT reveals evidence of multilevel degenerative disc disease and facet arthropathy Lumbar MRI: None on record   History Whitney Cline has a past medical history of Anemia; Arthritis; Asthma; Cancer ([place]); CHF (congestive heart failure) ([place]); Chronic kidney disease; COPD (chronic obstructive pulmonary disease) ([place]); Diabetes mellitus without complication ([place]); Encounter for blood transfusion; Hepatitis C; Hypertension; Obesity; Oxygen deficiency; Shingles; and Sleep apnea.   She has a past surgical history that includes Abdominal hysterectomy and Coronary angioplasty with stent (N/A, 2007).   Her family history includes Diabetes in her father; Heart disease in her brother and mother.She reports that she quit smoking about 5 years ago. Her smoking use included Cigarettes. She has never used smokeless tobacco. She reports that she does not drink alcohol or use drugs.   ---------------------------------------------------------------------------------------------------------------------- Past Medical History:  Diagnosis Date  . Anemia   . Arthritis   . Asthma   . Cancer [facility])    liver ca- had ablation cancer free now  . CHF (congestive heart failure) ([place])   . Chronic kidney disease   . COPD (chronic obstructive pulmonary disease) ([place])   . Diabetes mellitus without complication ([place])   . Encounter for blood transfusion   . Hepatitis C   . Hypertension   . Obesity   . Oxygen deficiency   . Shingles    2017  . Sleep apnea     Past Surgical History:  Procedure Laterality Date  .  ABDOMINAL HYSTERECTOMY    . CORONARY ANGIOPLASTY WITH STENT PLACEMENT N/A 2007    Family History  Problem Relation Age of Onset  . Heart disease Mother   . Diabetes Father   . Heart disease Brother     Social History  Substance Use  Topics  . Smoking status: Former Smoker    Types: Cigarettes    Quit date: 06/22/2011  . Smokeless tobacco: Never Used  . Alcohol use No    ---------------------------------------------------------------------------------------------------------------------- Social History   Social History  . Marital status: Single    Spouse name: N/A  . Number of children: N/A  . Years of education: N/A   Social History Main Topics  . Smoking status: Former Smoker    Types: Cigarettes    Quit date: 06/22/2011  . Smokeless tobacco: Never Used  . Alcohol use No  . Drug use: No  . Sexual activity: Not Asked   Other Topics Concern  . None   Social History Narrative  . None      ----------------------------------------------------------------------------------------------------------------------  ROS Review of Systems  Cardiac: No chest pain GI no constipationOr diarrhea Pulmonary no shortness of breath CNS: No confusion or excess somnolence  Objective:  BP (!) 155/72   Pulse 87   Temp 97.8 F (36.6 C)   Resp 18   Ht 5' 6.5" (1.689 m)   Wt 280 lb (127 kg)   SpO2 99% Comment: 3 liters o2 via [place]  BMI 44.52 kg/m   Physical Exam  Patient is alert and oriented 3 cooperative compliant a good historian Pupils are equally round reactive to light and EOMI Heart is regular rate and rhythm with distant heart sounds and no audible murmur Lungs are clear Her trapezius and deltoid bilaterally showed less tenderness overall.  Inspection of her low back reveals no paraspinous muscle lumbar trigger points.  Assessment & Plan:   Whitney Cline was seen today for back pain and leg pain.  Diagnoses and all orders for this visit:  Chronic pain syndrome  Low back pain at multiple sites  Cervicalgia  Facet arthritis of lumbar region (HCC)  Pain in joint of right shoulder  Low back derangement syndrome  Iron deficiency anemia, unspecified iron deficiency anemia type  Other orders -      Discontinue: oxyCODONE (OXY IR/ROXICODONE) 5 MG immediate release tablet; Take 1 tablet (5 mg total) by mouth 3 (three) times daily. -     oxyCODONE (OXY IR/ROXICODONE) 5 MG immediate release tablet; Take 1 tablet (5 mg total) by mouth 3 (three) times daily.     ----------------------------------------------------------------------------------------------------------------------  Problem List Items Addressed This Visit    None    Visit Diagnoses    Chronic pain syndrome    -  Primary   Low back pain at multiple sites       Relevant Medications   oxyCODONE (OXY IR/ROXICODONE) 5 MG immediate release tablet   Cervicalgia       Facet arthritis of lumbar region ([place])       Relevant Medications   oxyCODONE (OXY IR/ROXICODONE) 5 MG immediate release tablet   Pain in joint of right shoulder       Low back derangement syndrome       Relevant Medications   oxyCODONE (OXY IR/ROXICODONE) 5 MG immediate release tablet   Iron deficiency anemia, unspecified iron deficiency anemia type          ----------------------------------------------------------------------------------------------------------------------  1. DDD (degenerative disc disease), lumbar  She seems to be doing well with  her current regimen and opioid management. We will refill her medications for August 14 and September 13. Her most recent urine drug screen was appropriate in June and we have reviewed to [facility] practitioner database and it is appropriate. 2. Facet arthritis of lumbar region  Continue back stretching strengthening exercises as tolerated  3. Anemia, iron deficiency  -   4. Cervicalgia Continue with stretching strengthening exercises 5. Low back derangement syndrome We will defer on any repeat injections today   ----------------------------------------------------------------------------------------------------------------------  I am having Ms. Greenstein maintain her albuterol,  budesonide-formoterol, carvedilol, cetirizine, cholecalciferol, furosemide, gabapentin, metFORMIN, naloxone, tiotropium, glucose blood, albuterol, azelastine, FIFTY50 GLUCOSE METER 2.0, ACCU-CHEK AVIVA PLUS, hydrOXYzine, lidocaine, Pen Needles, triamcinolone cream, accu-chek multiclix, ferrous sulfate, Naloxone HCl, liraglutide, methocarbamol, carisoprodol, cyclobenzaprine, lactulose (encephalopathy), rifaximin, cephALEXin, and oxyCODONE. We will continue to administer dexamethasone, ropivacaine (PF) 2 mg/mL (0.2%), dexamethasone, and ropivacaine (PF) 2 mg/mL (0.2%).   Meds ordered this encounter  Medications  . lactulose, encephalopathy, (GENERLAC) 10 GM/15ML SOLN    Sig: Take 30 g by mouth daily as needed.  . rifaximin (XIFAXAN) 550 MG TABS tablet    Sig: Take 550 mg by mouth 2 (two) times daily.  . cephALEXin (KEFLEX) 250 MG capsule    Sig: Take by mouth 4 (four) times daily.  Marland Kitchen DISCONTD: oxyCODONE (OXY IR/ROXICODONE) 5 MG immediate release tablet    Sig: Take 1 tablet (5 mg total) by mouth 3 (three) times daily.    Dispense:  75 tablet    Refill:  0    Do not fill until [id number]  . oxyCODONE (OXY IR/ROXICODONE) 5 MG immediate release tablet    Sig: Take 1 tablet (5 mg total) by mouth 3 (three) times daily.    Dispense:  75 tablet    Refill:  0    Do not fill until [id number]   Follow-up: Return in about 2 months (around 05/01/2017) for evaluation, med refill.   Greater than 50% of this encounter was utilized to assist with a plan of care and coordination of care  Molli Barrows, MD

## 2017-03-09 NOTE — Unmapped (Signed)
Specialty Pharmacy Refill Coordination Note     Lori Popowski. Hogrefe is a 71 y.o. female contacted today regarding refills of her specialty medication(s).    Reviewed and verified with patient:     [address]  [place] [address]    Specialty medication(s) and dose(s) confirmed: yes  Changes to medications: no  Changes to insurance: no    Medication Adherence    Patient reported X missed doses in the last month:  0  Specialty Medication:  Xifaxan  Medication Assistance Program  Refill Coordination  Has the Patient's Contact Information Changed:  No  Is the Shipping Address Different:  No  Shipping Information  Delivery Scheduled:  Yes  Delivery Date:  03/18/17  Medications to be Shipped:  Xifaxan          Marzetta Board  Specialty Pharmacy Technician

## 2017-03-16 ENCOUNTER — Ambulatory Visit
Admission: RE | Admit: 2017-03-16 | Discharge: 2017-03-16 | Disposition: A | Discharge status: Home / Self Care | Payer: MEDICARE

## 2017-03-16 DIAGNOSIS — R188 Other ascites: Secondary | ICD-10-CM

## 2017-03-16 DIAGNOSIS — K7460 Unspecified cirrhosis of liver: Principal | ICD-10-CM

## 2017-03-16 LAB — BLOOD UREA NITROGEN: Urea nitrogen:MCnc:Pt:Ser/Plas:Qn:: 31 — ABNORMAL HIGH

## 2017-03-16 LAB — COMPREHENSIVE METABOLIC PANEL
ALBUMIN: 3.5 g/dL (ref 3.5–5.0)
ALKALINE PHOSPHATASE: 71 U/L (ref 38–126)
ANION GAP: 10 mmol/L (ref 9–15)
AST (SGOT): 34 U/L (ref 14–38)
BILIRUBIN TOTAL: 0.9 mg/dL (ref 0.0–1.2)
BLOOD UREA NITROGEN: 31 mg/dL — ABNORMAL HIGH (ref 7–21)
BUN / CREAT RATIO: 18
CALCIUM: 8.8 mg/dL (ref 8.5–10.2)
CHLORIDE: 96 mmol/L — ABNORMAL LOW (ref 98–107)
CREATININE: 1.74 mg/dL — ABNORMAL HIGH (ref 0.60–1.00)
EGFR MDRD AF AMER: 35 mL/min/{1.73_m2} — ABNORMAL LOW (ref >=60)
EGFR MDRD NON AF AMER: 29 mL/min/{1.73_m2} — ABNORMAL LOW (ref >=60)
POTASSIUM: 4.9 mmol/L (ref 3.5–5.0)
PROTEIN TOTAL: 8.1 g/dL (ref 6.5–8.3)
SODIUM: 139 mmol/L (ref 135–145)

## 2017-03-16 LAB — CBC
HEMATOCRIT: 25.4 % — ABNORMAL LOW (ref 36.0–46.0)
MEAN CORPUSCULAR HEMOGLOBIN CONC: 31.1 g/dL (ref 31.0–37.0)
MEAN PLATELET VOLUME: 7.2 fL (ref 7.0–10.0)
PLATELET COUNT: 111 10*9/L — ABNORMAL LOW (ref 150–440)
RED BLOOD CELL COUNT: 3.23 10*12/L — ABNORMAL LOW (ref 4.00–5.20)
WBC ADJUSTED: 3.3 10*9/L — ABNORMAL LOW (ref 4.5–11.0)

## 2017-03-16 LAB — MEAN PLATELET VOLUME: Lab: 7.2

## 2017-03-16 LAB — INR: Lab: 1.11

## 2017-03-16 NOTE — Unmapped (Signed)
Pt here today for Paracentesis performed by Vallery Sa NP.  22 gauge IV cath inserted in left antecubital vein x 1 attempt with blood drawn and sent to lab.  Albumin 25% 75 gm infused at 300 ml/hr on pump beginning at 1346 and completed at 1452.   VS repeated after procedure and normal.  IV cath removed intact and bleeding stopped with gauze pressure dressing applied to site.  Pt tolerated procedure well with no complaints and discharged in no distress to home.  See MAR and flow sheets.

## 2017-03-16 NOTE — Unmapped (Signed)
Paracentesis      Indications:   Increased abdominal girth      Procedure:  The risks and benefits of the procedure were explained and informed consent was obtained.  Refer to the consent documentation.  The head of the bed was placed 30-45 degrees above level and the meniscus of the ascites was evaluated by percussion with maneuvers.   Anatomic landmarks were used to mark the intended site of paracentesis in theright lower quadrant.  Ultrasound was used to confirm the site.     Local anesthesia with 1 percent lidocaine was introduced subcutaneously then deep to the skin until the parietal peritoneum was anesthetized. A small incision was made with a scalpel. A paracentesis needle with a catheter was introduced into this site until ascitic fluid was encountered and the catheter was introduced over the needle.  Ascitic fluid and the catheter were removed with minimal bleeding.  A sterile bandage was placed after holding pressure.  The patient tolerated the procedure well and remains in the same condition as pre-procedure.    Albumin 5%/25% 75 grams was administered at the time of paracentesis.       Complications:  none     Disposition of specimen(s)    Fluid Type:   Ascitic Fluid   Volume:    10.75 Liters   Character:   clear and yellow   Sent for:   not sent

## 2017-03-16 NOTE — Unmapped (Signed)
Hemoglobin is trending down again, so I've ordered another EGD to have the GAVE touched up. (little oozing blood vessels)

## 2017-03-17 MED FILL — XIFAXAN/550MG/TABS: XIFAXAN/550MG/TABS | 30 days supply | Qty: 60 | Fill #2

## 2017-04-11 NOTE — Unmapped (Signed)
Specialty Pharmacy Refill Coordination Note     Tniyah Nakagawa. [place] is a 71 y.o. female contacted today regarding refills of her specialty medication(s).    Reviewed and verified with patient:     [address]  [place] [address]    Specialty medication(s) and dose(s) confirmed: yes  Changes to medications: no  Changes to insurance: no    Medication Adherence    Patient reported X missed doses in the last month:  0  Specialty Medication:  XIFAXAN         Refill Coordination    Has the Patients' Contact Information Changed:  No  Is the Shipping Address Different:  No       Shipping Information    Delivery Scheduled:  Yes  Delivery Date:  04/19/17  Medications to be Shipped:  Kennyth Arnold  Specialty Pharmacy Technician

## 2017-04-13 ENCOUNTER — Ambulatory Visit
Admission: RE | Admit: 2017-04-13 | Discharge: 2017-04-13 | Disposition: A | Discharge status: Home / Self Care | Payer: MEDICARE

## 2017-04-13 DIAGNOSIS — K7460 Unspecified cirrhosis of liver: Principal | ICD-10-CM

## 2017-04-13 DIAGNOSIS — R300 Dysuria: Secondary | ICD-10-CM

## 2017-04-13 DIAGNOSIS — R188 Other ascites: Secondary | ICD-10-CM

## 2017-04-13 LAB — URINALYSIS
BILIRUBIN UA: NEGATIVE
BLOOD UA: NEGATIVE
GLUCOSE UA: NEGATIVE
HYALINE CASTS: 32 /LPF — ABNORMAL HIGH (ref 0–1)
KETONES UA: NEGATIVE
NITRITE UA: NEGATIVE
RBC UA: 2 /HPF (ref <4)
SPECIFIC GRAVITY UA: 1.014 (ref 1.003–1.030)
SQUAMOUS EPITHELIAL: 4 /HPF (ref 0–5)
UROBILINOGEN UA: 0.2
WBC UA: 137 /HPF — ABNORMAL HIGH (ref 0–5)

## 2017-04-13 LAB — CBC W/ AUTO DIFF
BASOPHILS ABSOLUTE COUNT: 0 10*9/L (ref 0.0–0.1)
EOSINOPHILS ABSOLUTE COUNT: 0.4 10*9/L (ref 0.0–0.4)
HEMATOCRIT: 26.8 % — ABNORMAL LOW (ref 36.0–46.0)
HEMOGLOBIN: 8.4 g/dL — ABNORMAL LOW (ref 12.0–16.0)
LARGE UNSTAINED CELLS: 1 % (ref 0–4)
LYMPHOCYTES ABSOLUTE COUNT: 0.3 10*9/L — ABNORMAL LOW (ref 1.5–5.0)
MEAN CORPUSCULAR HEMOGLOBIN CONC: 31.1 g/dL (ref 31.0–37.0)
MEAN CORPUSCULAR HEMOGLOBIN: 25.3 pg — ABNORMAL LOW (ref 26.0–34.0)
MEAN CORPUSCULAR VOLUME: 81.2 fL (ref 80.0–100.0)
MEAN PLATELET VOLUME: 7.8 fL (ref 7.0–10.0)
MONOCYTES ABSOLUTE COUNT: 0.1 10*9/L — ABNORMAL LOW (ref 0.2–0.8)
PLATELET COUNT: 107 10*9/L — ABNORMAL LOW (ref 150–440)
RED BLOOD CELL COUNT: 3.31 10*12/L — ABNORMAL LOW (ref 4.00–5.20)
RED CELL DISTRIBUTION WIDTH: 17.1 % — ABNORMAL HIGH (ref 12.0–15.0)
WBC ADJUSTED: 3.2 10*9/L — ABNORMAL LOW (ref 4.5–11.0)

## 2017-04-13 LAB — COMPREHENSIVE METABOLIC PANEL
ALBUMIN: 3.4 g/dL — ABNORMAL LOW (ref 3.5–5.0)
ALKALINE PHOSPHATASE: 67 U/L (ref 38–126)
ALT (SGPT): 21 U/L (ref 15–48)
ANION GAP: 16 mmol/L — ABNORMAL HIGH (ref 9–15)
AST (SGOT): 34 U/L (ref 14–38)
BILIRUBIN TOTAL: 0.9 mg/dL (ref 0.0–1.2)
BLOOD UREA NITROGEN: 36 mg/dL — ABNORMAL HIGH (ref 7–21)
CALCIUM: 9 mg/dL (ref 8.5–10.2)
CHLORIDE: 100 mmol/L (ref 98–107)
CO2: 27 mmol/L (ref 22.0–30.0)
CREATININE: 1.69 mg/dL — ABNORMAL HIGH (ref 0.60–1.00)
EGFR MDRD AF AMER: 36 mL/min/{1.73_m2} — ABNORMAL LOW (ref >=60)
EGFR MDRD NON AF AMER: 30 mL/min/{1.73_m2} — ABNORMAL LOW (ref >=60)
GLUCOSE RANDOM: 165 mg/dL (ref 65–179)
POTASSIUM: 4.3 mmol/L (ref 3.5–5.0)
SODIUM: 143 mmol/L (ref 135–145)

## 2017-04-13 LAB — PROTIME-INR: PROTIME: 12.6 s (ref 10.2–12.8)

## 2017-04-13 LAB — SMEAR REVIEW

## 2017-04-13 LAB — NEUTROPHILS ABSOLUTE COUNT: Lab: 2.4

## 2017-04-13 LAB — BACTERIA

## 2017-04-13 LAB — PROTIME: Lab: 12.6

## 2017-04-13 LAB — BLOOD UREA NITROGEN: Urea nitrogen:MCnc:Pt:Ser/Plas:Qn:: 36 — ABNORMAL HIGH

## 2017-04-13 MED ORDER — CIPROFLOXACIN 500 MG TABLET
ORAL_TABLET | Freq: Two times a day (BID) | ORAL | 0 refills | 0 days | Status: CP
Start: 2017-04-13 — End: 2017-04-20

## 2017-04-13 NOTE — Unmapped (Signed)
Patient here for a LVP. Vitals and patient stable. Albumin given. See Stefani Dama Metheny's note for details.

## 2017-04-13 NOTE — Unmapped (Signed)
Paracentesis      Indications:   Increased abdominal girth      Procedure:  The risks and benefits of the procedure were explained and informed consent was obtained.  Refer to the consent documentation.  The head of the bed was placed 30-45 degrees above level and the meniscus of the ascites was evaluated by percussion with maneuvers.   Anatomic landmarks were used to mark the intended site of paracentesis in theleft lower quadrant.  Ultrasound was used to confirm the site.     Local anesthesia with 1 percent lidocaine was introduced subcutaneously then deep to the skin until the parietal peritoneum was anesthetized. A small incision was made with a scalpel. A paracentesis needle with a catheter was introduced into this site until ascitic fluid was encountered and the catheter was introduced over the needle.  Ascitic fluid and the catheter were removed with minimal bleeding.  A sterile bandage was placed after holding pressure.  The patient tolerated the procedure well and remains in the same condition as pre-procedure.    Albumin 5%/25% 75 grams was administered at the time of paracentesis.       Complications:  none     Disposition of specimen(s)    Fluid Type:   Ascitic Fluid   Volume:   12.5 Liters   Character:   clear and yellow   Sent for:   Cell count and diff

## 2017-04-15 ENCOUNTER — Other Ambulatory Visit: Payer: Self-pay | Admitting: Anesthesiology

## 2017-04-17 MED FILL — XIFAXAN/550MG/TABS: XIFAXAN/550MG/TABS | 30 days supply | Qty: 60 | Fill #3

## 2017-04-18 ENCOUNTER — Inpatient Hospital Stay
Admission: EM | Admit: 2017-04-18 | Discharge: 2017-04-20 | Disposition: A | Discharge status: Home / Self Care | Payer: MEDICARE | Source: Intra-hospital

## 2017-04-18 DIAGNOSIS — R109 Unspecified abdominal pain: Principal | ICD-10-CM

## 2017-04-18 LAB — LACTATE BLOOD VENOUS
Lactate:SCnc:Pt:BldV:Qn:: 2.6 — ABNORMAL HIGH
Lactate:SCnc:Pt:BldV:Qn:: 3.3 — ABNORMAL HIGH

## 2017-04-18 LAB — CBC W/ AUTO DIFF
BASOPHILS ABSOLUTE COUNT: 0 10*9/L (ref 0.0–0.1)
EOSINOPHILS ABSOLUTE COUNT: 0.6 10*9/L — ABNORMAL HIGH (ref 0.0–0.4)
HEMATOCRIT: 27.9 % — ABNORMAL LOW (ref 36.0–46.0)
HEMOGLOBIN: 8.7 g/dL — ABNORMAL LOW (ref 12.0–16.0)
LARGE UNSTAINED CELLS: 2 % (ref 0–4)
LYMPHOCYTES ABSOLUTE COUNT: 0.3 10*9/L — ABNORMAL LOW (ref 1.5–5.0)
MEAN CORPUSCULAR HEMOGLOBIN CONC: 31.1 g/dL (ref 31.0–37.0)
MEAN CORPUSCULAR HEMOGLOBIN: 25.6 pg — ABNORMAL LOW (ref 26.0–34.0)
MEAN CORPUSCULAR VOLUME: 82.2 fL (ref 80.0–100.0)
MEAN PLATELET VOLUME: 8.6 fL (ref 7.0–10.0)
MONOCYTES ABSOLUTE COUNT: 0.3 10*9/L (ref 0.2–0.8)
NEUTROPHILS ABSOLUTE COUNT: 2.9 10*9/L (ref 2.0–7.5)
PLATELET COUNT: 125 10*9/L — ABNORMAL LOW (ref 150–440)
RED BLOOD CELL COUNT: 3.4 10*12/L — ABNORMAL LOW (ref 4.00–5.20)
RED CELL DISTRIBUTION WIDTH: 17.2 % — ABNORMAL HIGH (ref 12.0–15.0)
WBC ADJUSTED: 4.2 10*9/L — ABNORMAL LOW (ref 4.5–11.0)

## 2017-04-18 LAB — URINALYSIS WITH CULTURE REFLEX
BILIRUBIN UA: NEGATIVE
BLOOD UA: NEGATIVE
GLUCOSE UA: NEGATIVE
HYALINE CASTS: 11 /LPF — ABNORMAL HIGH (ref 0–1)
KETONES UA: NEGATIVE
NITRITE UA: NEGATIVE
RBC UA: 5 /HPF — ABNORMAL HIGH (ref <4)
SQUAMOUS EPITHELIAL: 7 /HPF — ABNORMAL HIGH (ref 0–5)
TRANSITIONAL EPITHELIAL: 1 /HPF (ref 0–2)
WBC UA: 87 /HPF — ABNORMAL HIGH (ref 0–5)

## 2017-04-18 LAB — COMPREHENSIVE METABOLIC PANEL
ALKALINE PHOSPHATASE: 67 U/L (ref 38–126)
ALT (SGPT): 29 U/L (ref 15–48)
ANION GAP: 11 mmol/L (ref 9–15)
AST (SGOT): 42 U/L — ABNORMAL HIGH (ref 14–38)
BLOOD UREA NITROGEN: 33 mg/dL — ABNORMAL HIGH (ref 7–21)
BUN / CREAT RATIO: 17
CALCIUM: 9.1 mg/dL (ref 8.5–10.2)
CHLORIDE: 102 mmol/L (ref 98–107)
CO2: 28 mmol/L (ref 22.0–30.0)
CREATININE: 1.98 mg/dL — ABNORMAL HIGH (ref 0.60–1.00)
EGFR MDRD AF AMER: 30 mL/min/{1.73_m2} — ABNORMAL LOW (ref >=60)
EGFR MDRD NON AF AMER: 25 mL/min/{1.73_m2} — ABNORMAL LOW (ref >=60)
GLUCOSE RANDOM: 144 mg/dL (ref 65–179)
POTASSIUM: 4.3 mmol/L (ref 3.5–5.0)
PROTEIN TOTAL: 7.5 g/dL (ref 6.5–8.3)
SODIUM: 141 mmol/L (ref 135–145)

## 2017-04-18 LAB — EOSINOPHILS ABSOLUTE COUNT: Lab: 0.6 — ABNORMAL HIGH

## 2017-04-18 LAB — HEMOGLOBIN A1C
HEMOGLOBIN A1C: 6.2 % — ABNORMAL HIGH (ref 4.8–5.6)
Hemoglobin A1c/Hemoglobin.total:MFr:Pt:Bld:Qn:: 6.2 — ABNORMAL HIGH

## 2017-04-18 LAB — PROTIME: Lab: 13.7 — ABNORMAL HIGH

## 2017-04-18 LAB — PROTIME-INR: PROTIME: 13.7 s — ABNORMAL HIGH (ref 10.2–12.8)

## 2017-04-18 LAB — CO2: Carbon dioxide:SCnc:Pt:Ser/Plas:Qn:: 28

## 2017-04-18 LAB — NITRITE UA: Lab: NEGATIVE

## 2017-04-19 LAB — CBC
HEMATOCRIT: 22.4 % — ABNORMAL LOW (ref 36.0–46.0)
HEMOGLOBIN: 6.9 g/dL — ABNORMAL LOW (ref 12.0–16.0)
MEAN CORPUSCULAR HEMOGLOBIN CONC: 30.7 g/dL — ABNORMAL LOW (ref 31.0–37.0)
MEAN CORPUSCULAR HEMOGLOBIN: 25.1 pg — ABNORMAL LOW (ref 26.0–34.0)
PLATELET COUNT: 89 10*9/L — ABNORMAL LOW (ref 150–440)
RED BLOOD CELL COUNT: 2.73 10*12/L — ABNORMAL LOW (ref 4.00–5.20)
WBC ADJUSTED: 1.5 10*9/L — ABNORMAL LOW (ref 4.5–11.0)

## 2017-04-19 LAB — BASIC METABOLIC PANEL
ANION GAP: 14 mmol/L (ref 9–15)
BLOOD UREA NITROGEN: 29 mg/dL — ABNORMAL HIGH (ref 7–21)
BUN / CREAT RATIO: 17
CHLORIDE: 100 mmol/L (ref 98–107)
CO2: 28 mmol/L (ref 22.0–30.0)
EGFR MDRD AF AMER: 37 mL/min/{1.73_m2} — ABNORMAL LOW (ref >=60)
EGFR MDRD NON AF AMER: 31 mL/min/{1.73_m2} — ABNORMAL LOW (ref >=60)
GLUCOSE RANDOM: 154 mg/dL (ref 65–179)
POTASSIUM: 3.8 mmol/L (ref 3.5–5.0)
SODIUM: 142 mmol/L (ref 135–145)

## 2017-04-19 LAB — MAGNESIUM: Magnesium:MCnc:Pt:Ser/Plas:Qn:: 1.6

## 2017-04-19 LAB — SODIUM: Sodium:SCnc:Pt:Ser/Plas:Qn:: 142

## 2017-04-19 LAB — HEMOGLOBIN: Lab: 6.9 — ABNORMAL LOW

## 2017-04-19 LAB — PROTIME-INR: INR: 1.33

## 2017-04-19 LAB — PHOSPHORUS
PHOSPHORUS: 3.6 mg/dL (ref 2.9–4.7)
Phosphate:MCnc:Pt:Ser/Plas:Qn:: 3.6

## 2017-04-19 LAB — PROTIME: Lab: 15.2 — ABNORMAL HIGH

## 2017-04-19 NOTE — Unmapped (Signed)
PHYSICAL THERAPY  Evaluation (04/19/17 1016)     Patient Name:  Kim Hull       Medical Record Number: [id number]   Date of Birth: Oct 23, 1945  Sex: Female            Treatment Diagnosis: deconditioning, below baseline functional mobility    ASSESSMENT    Kim Hull is a 71 y.o. 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 She presents to PT with generalized weakness and pain limiting functional mobility.  She demonstrates good participation today during session, however, limited ambulation distance 2/2 7/10 abdominal pain.  Given current deficits, she will benefit from skilled PT in the acute setting during admission.  Based on the AM-PAC 6 item raw score of 21/24, the patient is considered to be 29.52 % impaired with basic mobility. This indicates f/u PT 3x/wk in post acute setting provided continued family/ friend support, DME use and improvement in medical course.  After review of contributing co-morbidities and personal factors, clinical presentation and exam findings, patient demonstrates low complexity for evaluation and POC.    Today's Interventions: [facility] 21/24 transfer and gait training x 60 feet with RW support, postural correction, walker placement, sequencing, safety considerations with use of O2 extension tubing for in room mobility.  Patient education provided re: home safety, lifeline use, fall prevention, safe stair navigation, role of PT, POC in acute setting, consideration for additional assist from friend/ family available for mobility needs.  Patient encouraged to selfadvocate for OOB to chair and ambulation in hallway with RW, O2 and RN assist to further progress mobility in acute setting and prevent functional strength loss.  All questions answered.     Activity Tolerance: Patient tolerated treatment well    PLAN  Planned Frequency of Treatment:  1x per day for: 3-4x week      Planned Interventions: Balance activities;Functional mobility;Gait training;Education - Patient;Endurance activities;Home exercise program;Self-care / Home training;Stair training;Therapeutic activity;Therapeutic exercise    Post-Discharge Physical Therapy Recommendations:  3x weekly (with progress, provided family / friend assist available )        PT DME Recommendations:  (patient has all necessary DME @ home)     Goals:   Patient and Family Goals: to be able to return home, improve mobility, pain control    Long Term Goal #1: in 6 weeks patient will return to PLOF with household and community mobility       SHORT GOAL #1: Patient will ambulate x 150 feet with mod I, LRAD              Time Frame : 1 week  SHORT GOAL #2: Patient will navigate 6 steps with L side rail and cane use with mod I              Time Frame : 1 week                     Prognosis:  Good  Barriers to Discharge: Decreased caregiver support;Endurance deficits;Inaccessible home environment  Positive Indicators: participation, PLOF    SUBJECTIVE  Patient reports: That's the first outside person I have talked to since I have been here  Current Functional Status: patient received sitting EOB, session completion with patient same, needs in reach, RN updated re: mobility needs/recs     Prior functional status: Patient reports PTA she was using a [facility] for stair navigation, uses  a rollator when getting out into the community (puts O2 tank on seat/basket), navigates around her home with frame walker / rollator.  She received meals on wheels though enjoys making pies / sweets. She has a friend who assists with transportation to / from appts, store.  She reports one fall at home, slide out of her bed and had the fire dept assist her up.  Has lifeline. She reports upon d/c her friend and son can assist her PRN as they have thus far, neither present to verify.  Equipment available at home: [employer];Bedside commode;Tub bench    Past Medical History: Diagnosis Date   ??? Compensated HCV cirrhosis (CMS-HCC)    ??? COPD (chronic obstructive pulmonary disease) (CMS-HCC)    ??? Diabetes type 2, controlled (CMS-HCC)    ??? Essential hypertension 03/18/2010   ??? Iron deficiency anemia    ??? Liver cancer (CMS-HCC)    ??? OSA on CPAP 01/23/2014   ??? Pulmonary hypertension (CMS-HCC) 01/23/2014   ??? Varices, gastric     Social History   Substance Use Topics   ??? Smoking status: Former Smoker     Packs/day: 1.50     Years: 40.00     Quit date: 03/01/2007   ??? Smokeless tobacco: Never Used   ??? Alcohol use No      Past Surgical History:   Procedure Laterality Date   ??? APPENDECTOMY     ??? HYSTERECTOMY     ??? PR UPPER GI ENDOSCOPY,DIAGNOSIS N/A 04/16/2016    Procedure: UGI ENDO, INCLUDE ESOPHAGUS, STOMACH, & DUODENUM &/OR JEJUNUM; DX W/WO COLLECTION SPECIMN, BY BRUSH OR WASH;  Surgeon: Janyth Pupa, MD;  [facility];  Service: Gastroenterology   ??? PR UPPER GI ENDOSCOPY,LIGAT VARIX N/A 08/20/2016    Procedure: UGI ENDO; W/BAND LIG ESOPH &/OR GASTRIC VARICES;  Surgeon: Vonda Antigua, MD;  Location: [facility];  Service: Gastroenterology    Family History   Problem Relation Age of Onset   ??? Heart failure Mother    ??? Diabetes Father         Allergies: Decadron  [dexamethasone] and Dexamethasone sodium phosphate        Objective Findings              Precautions: Falls              Weight Bearing Status: Non- applicable              Required Braces or Orthoses: Non- applicable    Communication Preference: Verbal  Pain Comments: reports not present at rest, 7/10 abdominal pain with ambulation, relieved with sitting  Medical Tests / Procedures: labs, orders, consults, imaging reviewed in EPIC prior to consult completion  Equipment / Environment: Supplemental oxygen;Vascular access (PIV, TLC, Port-a-cath, PICC)    At Rest: HR 78 bpm, O2 sat 99% on 3L [place]  With Activity: HR 82 bpm, O2 sat 94% on 3L [place]  Orthostatics: asymptomatic       Living environment: Physiological scientist  Lives With: Alone  Home Living: One level home;Stairs to enter with rails;Tub/shower unit;Tub bench  Rail placement (outside): Rail on left side     Number of Stairs: 6    Cognition: a and o x 4     Skin Inspection: bruising over L hand (patient reports frail skin 2/2 meds)    UE ROM: WFL B  UE Strength: WFL B  LE ROM: WFL B  LE Strength: WFL B  Balance: sitting EOB: independent;  static standing: independent   Posture: kyphotic with forward head, flexed posture in standing (baseline per patient)     Bed Mobility: Patient reports mod I with bed mobility, not formally assessed 2/2 patient received sitting EOB  Transfers: Sit<>stand with supervision, RW support, reports previously performed bed<>BSC transfer independently   Gait: Patient able to ambulate x 60 feet with RW, supervision on 3 L [place].  Patient requires cuing for postural correction, visual righting.  Distance limited by fatigue and pain (reports near baseline)   Stairs: Not formally assessed today.             Eval Duration(PT): 22 Min.    Medical Staff Made Aware: RN  PT G-Codes  Functional Assessment Tool Used: AMPAC  Score: 21/24  Functional Limitation: Mobility: Walking and moving around  Mobility: Walking and Moving Around Current Status (Z6109): At least 20 percent but less than 40 percent impaired, limited or restricted  Mobility: Walking and Moving Around Goal Status [id number]): At least 1 percent but less than 20 percent impaired, limited or restricted  Rationale: 29.52% impaired  I attest that I have reviewed the above information.  Signed: Madlyn Frankel Ivelisse Culverhouse, PT  Ceasar Mons 0/98/1191

## 2017-04-19 NOTE — Unmapped (Signed)
Medicine History and Physical    Assessment/Plan:    Active Problems:    * No active hospital problems. *  Resolved Problems:    * No resolved hospital problems. *      Kim Hull is a 71 y.o. 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    Sepsis, unclear source  Presents tachycardic with elevated lactate. HR elevated into the 110s but improved after 1 L of IV fluid. Lactate elevated to 3.1, but now downtrending. Most likely sources of infection are SBP given history of worsening abdominal distention and pain. Unfortunately, the ED did not feel there was a pocket safe enough to tap. She has redness and erythema overlying LVP site from last week so cellulitis is also on the differential. She had an episode of back pain relieved by urinating last week so along with a UA that showed large leuk esterase so she has been on ciprofloxacin for possible UTI, although these symptoms resolved. Described some mild SOB in the last few days so will order CXR, however no worsening of baseline oxygenation. In the ED, she was given a dose of cefepime and flagyl. Will empirically cover her for SBP with ceftriaxone and cellulitis with vancomycin.   - f/u urine culture  - f/u blood cultures  - CXR  - ceftriaxone 1 g x 5 days, albumin 150 g  - vancomycin, dosed by pharmacy  - consult M for diagnostic paracentesis in the AM    Acute on Chronic Kidney Injury  Baseline over last year anywhere between 1-1.5. Cr 1.69 5 days ago, now elevated to 1.98. Endorses decreased urine output. Likely pre-renal in the setting of infection, however HRS is on the differential. She is s/p 1L of fluid. 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 Follows with Dr. Ruffin Frederick. Receives period LVPs with most recent on 9/12. Hx of varices with banding January 2018. 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   - lactulose TID, titrate to 3-5 BM per day  - rifaximin  - holding lasix in setting of possible AKI    HCC  S/p ablation in 2016    DMT2  Hold home metformin and victoza. Holding home gabapentin.  - SSI insulin  - holding home gabapentin in setting of worsening renal function  - A1C    COPD, 3L of O2 at baseline  No wheezing on exam. Mild SOB  - symbicort BID  - spiriva daily  - albuterol prn    Code Status: DNR/DNI - confirmed on admission    ___________________________________________________________________    Chief Complaint:  Chief Complaint   Patient presents with   ??? Wound Infection     <principal problem not specified>    HPI:  Kim Hull is a 71 y.o. 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    Kim Hull gets intermittent LVP's to help with ascites. Her most recent paracentesis was on 9/12. She reports that she typically gets abdominal cramping the day after LVP's, however she felt  the cramping was worse after this procedure. Additionally, the cramping continued throughout the weekend and has been worsening. She says her abdominal distension started worsening soon after the procedure. She also describes an apple-sized knot on her abdomen just cephalic to where the LVP was performed. The knot persisted until today when she had a large amount of fluid come out of her LVP site. She described the fluid as similar to what comes out during her LVP's. She did not think it looked like pus and did not think it looked bloody. She described the skin over the LVP site as being warm for several days. Today, she noted that the skin became red so she decided to come into the hospital. ROS is notable for mild SOB a few days ago. She denies fevers, chills, cough, chest pain, N/V, diarrhea, constipation, dysuria, or urinary frequency.     She reports that last week she had bilateral back pain that was relieved when she urinated. She called the GI clinic and when she went in for her LVP, she was tested for a UTI and given a 10 day course of ciprofloxacin. She says this sensation only happened once and has improved since starting the antibiotic.     In the ED, she was tachycardic to 113 which improved to the 90s after fluid resuscitation. Her initial lactate was 3.1 but improved to ~2 after the fluids. Her BP was stable in the 140s/70s. Oxygen saturation was 100% on home 3L of oxygen.     Allergies:  Decadron  [dexamethasone] and Dexamethasone sodium phosphate    Medications:   Prior to Admission medications    Medication Dose, Route, Frequency   acetaminophen (TYLENOL) 325 MG tablet 650 mg, Oral, Every 6 hours PRN   albuterol (PROAIR HFA) 90 mcg/actuation inhaler 2 inhalation, Inhalation, Every 4 hours PRN   blood sugar diagnostic (ACCU-CHEK AVIVA PLUS TEST STRP) Strp Frequency:PHARMDIR   Dosage:0.0     Instructions:  Note:Use three time per day. Dx. Type 2 Diabetes Mellitus Dose: 1   blood-glucose meter kit Use as directed. E11.9   budesonide-formoterol (SYMBICORT) 160-4.5 mcg/actuation inhaler 2 puffs, Inhalation, 2 times a day (standard)   canagliflozin (INVOKANA) 100 mg tablet 100 mg, Oral, Daily (standard)   carvedilol (COREG) 3.125 MG tablet Oral, 2 times a day   cetirizine (ZYRTEC) 10 MG tablet 10 mg   cholecalciferol, vitamin D3, (CHOLECALCIFEROL) 400 unit Tab 400 Units, Oral, Daily (standard), Frequency:BID   Dosage:400   UNIT  Instructions:  Note:Dose: 400UNIT   ciprofloxacin HCl (CIPRO) 500 MG tablet 500 mg, Oral, 2 times a day (standard)   ferrous sulfate 325 (65 FE) MG tablet Oral, Daily   gabapentin (NEURONTIN) 300 MG capsule 300 mg, Oral, Daily (standard)   hydrOXYzine (ATARAX) 25 MG tablet 25 mg, Oral, Daily PRN lactulose (CHRONULAC) 10 gram/15 mL solution 30 g, Oral, Daily (standard)   metFORMIN (GLUCOPHAGE-XR) 500 MG 24 hr tablet 500 mg, Oral, 2 times a day   oxyCODONE (ROXICODONE) 5 MG immediate release tablet 5 mg, Oral, Every 8 hours PRN   rifAXIMin (XIFAXAN) 550 mg Tab 550 mg, Oral, 2 times a day (standard)   tiotropium (SPIRIVA WITH HANDIHALER) 18 mcg inhalation capsule 18 mcg, Inhalation, Daily PRN, Frequency:QD   Dosage:18   MCG  Instructions:  Note:Dose: 18 MCG   tiotropium (SPIRIVA WITH HANDIHALER) 18 mcg inhalation capsule 18 mcg, Inhalation, Daily (standard)   VICTOZA 3-PAK 0.6 mg/0.1 mL (18 mg/3 mL) injection 1.8 mg, Other, Daily (standard)  furosemide (LASIX) 20 MG tablet 20 mg, Oral, Daily (standard)  Patient taking differently: Take 20 mg by mouth Two (2) times a day.        Medical History:  Past Medical History:   Diagnosis Date   ??? Compensated HCV cirrhosis (CMS-HCC)    ??? COPD (chronic obstructive pulmonary disease) (CMS-HCC)    ??? Diabetes type 2, controlled (CMS-HCC)    ??? Essential hypertension 03/18/2010   ??? Iron deficiency anemia    ??? Liver cancer (CMS-HCC)    ??? OSA on CPAP 01/23/2014   ??? Pulmonary hypertension (CMS-HCC) 01/23/2014   ??? Varices, gastric        Surgical History:  Past Surgical History:   Procedure Laterality Date   ??? APPENDECTOMY     ??? HYSTERECTOMY     ??? PR UPPER GI ENDOSCOPY,DIAGNOSIS N/A 04/16/2016    Procedure: UGI ENDO, INCLUDE ESOPHAGUS, STOMACH, & DUODENUM &/OR JEJUNUM; DX W/WO COLLECTION SPECIMN, BY BRUSH OR WASH;  Surgeon: Janyth Pupa, MD;  [facility];  Service: Gastroenterology   ??? PR UPPER GI ENDOSCOPY,LIGAT VARIX N/A 08/20/2016    Procedure: UGI ENDO; W/BAND LIG ESOPH &/OR GASTRIC VARICES;  Surgeon: Vonda Antigua, MD;  Location: [facility];  Service: Gastroenterology       Social History:  Social History     Social History   ??? Marital status: Single     Spouse name: N/A   ??? Number of children: N/A   ??? Years of education: N/A     Occupational History   ??? Not on file.     Social History Main Topics   ??? Smoking status: Former Smoker     Packs/day: 1.50     Years: 40.00     Quit date: 03/01/2007   ??? Smokeless tobacco: Never Used   ??? Alcohol use No   ??? Drug use: No      Comment: Cocaine IV history   ??? Sexual activity: Not on file     Other Topics Concern   ??? Not on file     Social History Narrative   ??? No narrative on file       Family History:  Family History   Problem Relation Age of Onset   ??? Heart failure Mother    ??? Diabetes Father        Review of Systems:  10 systems reviewed and are negative unless otherwise mentioned in HPI    Labs/Studies:  Labs and Studies from the last 24hrs per EMR and Reviewed    Physical Exam:  VITAL SIGNS: BP 144/73  - Pulse 113  - Temp 36.8 ??C (Skin)  - Resp 14  - LMP  (LMP Unknown)  - SpO2 99%   GENERAL: No acute distress. Morbidly obese  HEENT: Normocephalic.   CARDIOVASCULAR: normal rate, regular rhythm, no murmurs  RESPIRATORY: Lungs clear to auscultation in anterior lung fields, no wheezing  ABDOMEN: Soft, distended, TTP diffusely, no rebound, no guarding, bowel sounds present, large erythematous patch overlying central abdomen including site of previous LVP, no fluid leaking from from LVP site  EXTREMITIES:  No lower extremity edema  SKIN: Scattered ecchymoses on extremities  NEUROLOGIC: Appropriate mood and affect. Alert and oriented to person, place, time, and situation. Moving all four extremities spontaneously. No asterixis   .

## 2017-04-19 NOTE — Unmapped (Signed)
ED Progress Note    71 yo female h/o HCV (s/p treatment) c/b HCC (s/p embolization), COPD (on 3L home O2), HTN, OSA, pulm htn presents with drainage,tenderness and rash on abdomen around site of large volume paracentessis. 5 days ago patient had LVP, 2 days after because having increased pain, redness, and tenderness around incision site. In the ED, patient was tachycardic, but normotensive. Patient noted to have large erythematous rash on abdomen, suspicious of cellulitis. Will rule out SBP. In the ED patient has received 1L, and started on vanc and cefepime. Lactate 3.3.     Todo:   -Diagnostic paracentesis  -F/U sepsis labs       8:00PM: Unfortunately, patient does not have a large enough pocked to perform a diagnostic paracentesis on. Spoke with Medicine admission team regarding admission. Patient remains stable, with no active complaints.

## 2017-04-19 NOTE — Unmapped (Signed)
Pt reports increasing confusion, abd distention - fluid, redness and warmth at the abd centesis area. Drainage also noted at the site. Pt also reporting increased SOB with exersion

## 2017-04-19 NOTE — Unmapped (Signed)
Patient rounding complete, call bell in reach, bed locked and in lowest position, patient belongings at bedside and within reach of patient.  MD in room with pt.

## 2017-04-19 NOTE — Unmapped (Addendum)
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  Below are details of her 2 day stay at [facility] according to problem:   ??  Cellulitis  Initial concern for SPB but Med M was consulted and did not find window for paracentesis. Cellulitis present on abdomen and this was at first treated with broad spectrum antibiotics until blood cultures resulted in no growth.  Then transitioned to doxycycline and keflex for total 7 days.  ??  Chronic Kidney Injury (stage 3) with mild increase in Creatinine (now improved):  Cr 1.69 5 days ago and elevated to 1.98 on admit . Endorses decreased urine output. On low dose of lasix at home.  Probably some mild dehydration. She is s/p 1L of fluid. Given the initial concern for SBP, was also treated with albumin 1.5 g/kg on admit and lasix held.  However, not likely SBP and will restart low dose lasix.  ??  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 Follows with Dr. Ruffin Frederick. Receives regular LVPs with most recent on 9/12 and has needed more recently. Hx of varices with banding January 2018. 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    and lactulose and rifaximin.  Diuretics as above.  Overall, not decompensated, but still not transplant candidate per notes from Jan 2018 and would continue regular LVPs (next is 9/26) and continued conversations about goals of care.  ??  HCC  S/p ablation in 2016  ??  DMT2  Home metformin and victoza initially held, but will restart upon dc.  She is close to cut off for stopping metformin and will have PCP continue to monitor and help with management in the future. She has not seen her PCP since Dec 2017 per her report and I have encouraged her to follow up for DM and other care with Dr. Greggory Stallion.  ??  COPD, 3L of O2 at baseline  No wheezing on exam. No increased O2 requirement. Mild SOB but stable and will continue symbicort BID, spiriva daily and albuterol prn

## 2017-04-19 NOTE — Unmapped (Signed)
Problem: Fall Risk (Adult)  Goal: Identify Related Risk Factors and Signs and Symptoms  Related risk factors and signs and symptoms are identified upon initiation of Human Response Clinical Practice Guideline (CPG).   Outcome: Progressing   04/19/17 0156   Fall Risk (Adult)   Related Risk Factors (Fall Risk) age-related changes;fatigue/slow reaction;gait/mobility problems   Signs and Symptoms (Fall Risk) presence of risk factors     Goal: Absence of Fall  Patient will demonstrate the desired outcomes by discharge/transition of care.   Outcome: Progressing      Problem: Pain, Chronic (Adult)  Goal: Identify Related Risk Factors and Signs and Symptoms  Related risk factors and signs and symptoms are identified upon initiation of Human Response Clinical Practice Guideline (CPG).   Outcome: Progressing    Goal: Acceptable Pain/Comfort Level and Functional Ability  Patient will demonstrate the desired outcomes by discharge/transition of care.   Outcome: Progressing      Problem: VTE, DVT and PE (Adult)  Goal: Signs and Symptoms of Listed Potential Problems Will be Absent, Minimized or Managed (VTE, DVT and PE)  Signs and symptoms of listed potential problems will be absent, minimized or managed by discharge/transition of care (reference VTE, DVT and PE (Adult) CPG).   Outcome: Progressing

## 2017-04-19 NOTE — Unmapped (Signed)
Vancomycin Initiation Pharmacy Note    Kim Hull is a 71 y.o. female being initiated on vancomycin therapy for Skin and Soft Tissue Infection (SSTI) - Mild.    Goal trough: 10-15 mg/L    Pharmacokinetic Parameters:    Wt Readings from Last 1 Encounters:   04/13/17 (!) 135.6 kg (299 lb)     Lab Results   Component Value Date    CREATININE 1.98 (H) 04/18/2017      Vd = 118.4 L, ke = 0.018 hr-1    Recommended Dose: 1000 mg IV q24h    Estimated Peak: 24 mg/L  Estimated trough: 16 mg/L    Pharmacy will continue to monitor and order levels as appropriate.  Patient will be followed for changes in renal function, toxicity, and efficacy.  Please page service pharmacist with questions/clarifications.    Asencion Partridge,  PharmD

## 2017-04-19 NOTE — Unmapped (Signed)
MD at bedside. 

## 2017-04-19 NOTE — Unmapped (Signed)
Problem: Patient Care Overview  Goal: Plan of Care Review  Outcome: Progressing   04/19/17 1514   OTHER   Plan of Care Reviewed With patient   Plan of Care Review   Progress improving     Patient alert and oriented x4. Pt remains on Heparin subq for VTE. Receiving PRN oxycodone for pain and states that it is effective.  Falls precautions remain in place commode at bedside and call bell within reach.  Pt ambulates steps to commode. No new complaints. Will continue to monitor.   Goal: Individualization and Mutuality  Outcome: Progressing   04/19/17 1514   Individualization   Patient Specific Goals (Include Timeframe) Patient will remain free of falls throughout admission 03/21/18   Patient Specific Interventions Falls precautions in place; bedside commode, bed in lowest position, call bell within reach.        Problem: Fall Risk (Adult)  Goal: Absence of Fall  Patient will demonstrate the desired outcomes by discharge/transition of care.   Outcome: Progressing   04/19/17 1514   Fall Risk (Adult)   Absence of Fall making progress toward outcome       Problem: Pain, Chronic (Adult)  Goal: Identify Related Risk Factors and Signs and Symptoms  Related risk factors and signs and symptoms are identified upon initiation of Human Response Clinical Practice Guideline (CPG).   Outcome: Progressing    Goal: Acceptable Pain/Comfort Level and Functional Ability  Patient will demonstrate the desired outcomes by discharge/transition of care.   Outcome: Progressing   04/19/17 1514   Pain, Chronic (Adult)   Acceptable Pain/Comfort Level and Functional Ability making progress toward outcome       Problem: VTE, DVT and PE (Adult)  Goal: Signs and Symptoms of Listed Potential Problems Will be Absent, Minimized or Managed (VTE, DVT and PE)  Signs and symptoms of listed potential problems will be absent, minimized or managed by discharge/transition of care (reference VTE, DVT and PE (Adult) CPG).   Outcome: Progressing   04/19/17 1514 VTE, DVT and PE (Adult)   Problems Assessed (VTE, DVT, PE) all   Problems Present (VTE, DVT, PE) none

## 2017-04-19 NOTE — Unmapped (Signed)
Care Management  Initial Transition Planning Assessment    71 y.o. 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      Medical Provider(s): Rayetta Humphrey, MD  Primary Insurance- Payor: MEDICARE / Plan: MEDICARE PART A AND PART B / Product Type: *No Product type* /   Secondary Insurance ??? Secondary Insurance  MEDICAID [place]  Prescription Coverage ??? Part D  Preferred Pharmacy - [id number] - [place],  - [address] [place] [address]  [facility] - [place], [place] - [address]  At this time the patient is able to make  her own decisions. HAS MOST form.   Transportation home: Private vehicle  Level of function prior to admission: Independent- Modified    Previous admit date: 08/19/2016              General  Care Manager assessed the patient by : In person interview with patient, Medical record review, Discussion with Clinical Care team  Orientation Level: Oriented X4  Who provides care at home?: N/A    Contact/Decision Maker:    Contact Details  Contact Details: Primary Contact  Primary Contact Name: [name]  Primary Contact Relationship: Son  Phone #1: [phone]    Advance Directive (Medical Treatment)  Does patient have an advance directive covering medical treatment?: Patient has advance directive covering medical treatment, copy not in chart.  Type of advance directive: : Health care power of attorney  Advance directive covering medical treatment not in Chart:: Copy requested from family  Surrogate decision maker appointed:: HCPOA (copy in chart)  Surrogate decision maker's name:: [name] listed above  Surrogate decision maker's phone number:: [phone]  Information provided on advance directive:: Yes  Patient requests assistance:: No    Patient Information:    Lives with: Alone    Type of Residence: Private residence  Location/Detail: [address] [place], [place] [address] [phone]    Support Systems: Children    Responsibilities/Dependents at home?: No    Home Care services in place prior to admission?: No    Outpatient/Community Resources in place prior to admission: Meal Delivery Service (Meals on Wheels)    Equipment Currently Used at Home: cane, quad, walker, standard, walker, rolling, tub bench, commode, oxygen (Rolator, Life Alert)  Current Radiation protection practitioner (Name/Phone #): Continuous Home O2 with [facility]    Currently receiving outpatient dialysis?: N/A     Financial Information:     Patient source of income: SSI    Need for financial assistance?: No     Discharge Needs Assessment:    Concerns to be Addressed: care coordination/care conferences, discharge planning    Clinical Risk Factors: > 65, Functional Limitations, Lives Alone or Absence of Caregiver to Assist with Discharge and Home Care    Barriers to taking medications: No    Prior overnight hospital stay or ED visit in last 90 days: No    Readmission Within the Last 30 Days: no previous admission in last 30 days     Anticipated Changes Related to Illness: none    Equipment Needed After Discharge: none    Discharge Facility/Level of Care Needs: other (see comments) (Home)    Patient at risk for readmission?: Yes    Discharge Plan:    Screen findings are: Discharge planning needs identified or anticipated (Comment). (PT reccomends 3 times weekly therapy.  Pateint firmly declined HHC. )    Expected Discharge Date: 04/21/17    Expected Transfer from Critical Care:      Patient and/or family were provided with choice of facilities / services that are available and appropriate to meet post hospital care needs?: Other (Comment) (Declined HHC)       Initial Assessment complete?: Yes

## 2017-04-19 NOTE — Unmapped (Signed)
PT reports having 25lbs of fluid pumped out of her stomach on Wednesday and pt says a knot was there but now it is draining yellow fluid. Area surrounding the needle point is red and inflammed. Pt denies fever/chills.

## 2017-04-19 NOTE — Unmapped (Signed)
[facility]  Emergency Department Provider Note    ED Clinical Impression     Final diagnoses:   Other ascites (Primary)       Initial Impression, ED Course, Assessment and Plan     Impression: 71yo woman with background of prior HCV (treated), cirrhosis, HCC (treated w/ embolization), COPD on 3L home oxygen, T2DM, HTN, OSA, pulmonary hypertension w/ varices, and morbid obesity presents with leakage from drainage site and local inflammation and tenderness about site of recent large volume paracentesis. Has 4 weekly LVPs due to recurrent ascites in the context of progressive cirrhosis, last 5 days ago; 3 days ago began noticing leakage of serous ascitic fluid from drainage site along with localized inflammation, redness, and tenderness, with increasing tension of overlying skin. Denies fever, nausea, vomiting, but endorses reduced oral intake, pain, and suprapubic tenderness. Tachycardic in ED to > 110, afebrile, normotensive. Exam significant for abdominal distension due to both obesity and ascites, band of erythema at level of umbilicus extending approximately 40cm horizontally, associated with increased tenderness to palpation, especially just L of umbilicus. Impression: Cellulitis of abdominal skin due to LVP vs bacterial peritonitis vs urosepsis.     Discussed with Dr. Neva Seat: for IV fluids, IV antibiotics, CBC, CMP, lactate, PT-INR, blood cultures, urine culture, diagnostic tap.    1900 - Signed out to Dr. Sol Passer.       Additional Medical Decision Making     I have reviewed the vital signs and the nursing notes. Labs and radiology results that were available during my care of the patient were independently reviewed by me and considered in my medical decision making.     I staffed the case with the ED attending, Dr. Neva Seat.    Portions of this record have been created using Scientist, clinical (histocompatibility and immunogenetics). Dictation errors have been sought, but may not have been identified and corrected. ____________________________________________       History     Chief Complaint  Wound Infection      HPI   Kim Hull is a 71 y.o. female with background of prior HCV (treated), cirrhosis, HCC (treated w/ embolization), COPD on 3L home oxygen, T2DM, HTN, OSA, pulmonary hypertension w/ varices, and morbid obesity presents with leakage from drainage site and local inflammation and tenderness about site of recent large volume paracentesis. Has 4 weekly LVPs due to recurrent ascites in the context of progressive cirrhosis, last 5 days ago; 3 days ago began noticing leakage of serous ascitic fluid from drainage site along with localized inflammation, redness, and tenderness, with increasing tension of overlying skin. Denies fever, nausea, vomiting, but endorses reduced oral intake, pain, and suprapubic tenderness.     Past Medical History:   Diagnosis Date   ??? Compensated HCV cirrhosis (CMS-HCC)    ??? COPD (chronic obstructive pulmonary disease) (CMS-HCC)    ??? Diabetes type 2, controlled (CMS-HCC)    ??? Essential hypertension 03/18/2010   ??? Iron deficiency anemia    ??? Liver cancer (CMS-HCC)    ??? OSA on CPAP 01/23/2014   ??? Pulmonary hypertension (CMS-HCC) 01/23/2014   ??? Varices, gastric        Patient Active Problem List   Diagnosis   ??? Chronic hepatitis (CMS-HCC)   ??? Type II diabetes mellitus (CMS-HCC)   ??? Essential hypertension (RAF-HCC)   ??? COPD (chronic obstructive pulmonary disease) (CMS-HCC)   ??? Pulmonary hypertension   ??? OSA on CPAP   ??? Dependence on supplemental oxygen   ??? Morbid (severe)  obesity due to excess calories (CMS-HCC)   ??? Chronic respiratory disease   ??? Compensated cirrhosis related to hepatitis C virus (HCV) (CMS-HCC)   ??? Gastrointestinal hemorrhage with hematemesis   ??? Melena       Past Surgical History:   Procedure Laterality Date   ??? APPENDECTOMY     ??? HYSTERECTOMY     ??? PR UPPER GI ENDOSCOPY,DIAGNOSIS N/A 04/16/2016    Procedure: UGI ENDO, INCLUDE ESOPHAGUS, STOMACH, & DUODENUM &/OR JEJUNUM; DX W/WO COLLECTION SPECIMN, BY BRUSH OR WASH;  Surgeon: Janyth Pupa, MD;  [facility];  Service: Gastroenterology   ??? PR UPPER GI ENDOSCOPY,LIGAT VARIX N/A 08/20/2016    Procedure: UGI ENDO; W/BAND LIG ESOPH &/OR GASTRIC VARICES;  Surgeon: Vonda Antigua, MD;  [facility]: [facility];  Service: Gastroenterology       No current facility-administered medications for this encounter.     Current Outpatient Prescriptions:   ???  acetaminophen (TYLENOL) 325 MG tablet, Take 2 tablets (650 mg total) by mouth every six (6) hours as needed. (Patient not taking: Reported on 02/16/2017), Disp: 60 tablet, Rfl: 0  ???  albuterol (PROAIR HFA) 90 mcg/actuation inhaler, Inhale 2 inhalation every four (4) hours as needed., Disp: , Rfl:   ???  blood sugar diagnostic (ACCU-CHEK AVIVA PLUS TEST STRP) Strp, Frequency:PHARMDIR   Dosage:0.0     Instructions:  Note:Use three time per day. Dx. Type 2 Diabetes Mellitus Dose: 1, Disp: , Rfl:   ???  blood-glucose meter kit, Use as directed. E11.9, Disp: , Rfl:   ???  budesonide-formoterol (SYMBICORT) 160-4.5 mcg/actuation inhaler, Inhale 2 puffs Two (2) times a day., Disp: , Rfl:   ???  canagliflozin (INVOKANA) 100 mg tablet, Take 100 mg by mouth daily., Disp: , Rfl:   ???  carvedilol (COREG) 3.125 MG tablet, Take by mouth Two (2) times a day., Disp: , Rfl: 10  ???  cetirizine (ZYRTEC) 10 MG tablet, Take 10 mg by mouth. Frequency:QD   Dosage:10   MG  Instructions:  Note:Dose: 10MG , Disp: , Rfl:   ???  cholecalciferol, vitamin D3, (CHOLECALCIFEROL) 400 unit Tab, Take 400 Units by mouth daily. Frequency:BID   Dosage:400   UNIT  Instructions:  Note:Dose: 400UNIT, Disp: , Rfl:   ???  ciprofloxacin HCl (CIPRO) 500 MG tablet, Take 1 tablet (500 mg total) by mouth Two (2) times a day. for 10 days, Disp: 20 tablet, Rfl: 0  ???  ferrous sulfate 325 (65 FE) MG tablet, Take by mouth daily with breakfast. , Disp: , Rfl:   ???  furosemide (LASIX) 20 MG tablet, Take 1 tablet (20 mg total) by mouth daily. (Patient taking differently: Take 20 mg by mouth Two (2) times a day. ), Disp: 30 tablet, Rfl: 0  ???  gabapentin (NEURONTIN) 300 MG capsule, Take 300 mg by mouth daily. , Disp: , Rfl:   ???  hydrOXYzine (ATARAX) 25 MG tablet, Take 25 mg by mouth daily as needed. , Disp: , Rfl:   ???  lactulose (CHRONULAC) 10 gram/15 mL solution, Take 30 g by mouth daily., Disp: , Rfl:   ???  metFORMIN (GLUCOPHAGE-XR) 500 MG 24 hr tablet, Take 500 mg by mouth Two (2) times a day. , Disp: , Rfl:   ???  oxyCODONE (ROXICODONE) 5 MG immediate release tablet, Take 5 mg by mouth every eight (8) hours as needed. , Disp: , Rfl:   ???  rifAXIMin (XIFAXAN) 550 mg Tab, Take 1 tablet (550 mg  total) by mouth Two (2) times a day., Disp: 180 tablet, Rfl: 4  ???  tiotropium (SPIRIVA WITH HANDIHALER) 18 mcg inhalation capsule, Place 18 mcg into inhaler and inhale daily as needed. Frequency:QD   Dosage:18   MCG  Instructions:  Note:Dose: 18 MCG, Disp: , Rfl:   ???  tiotropium (SPIRIVA WITH HANDIHALER) 18 mcg inhalation capsule, Place 1 capsule (18 mcg total) into inhaler and inhale daily., Disp: 30 capsule, Rfl: 11  ???  VICTOZA 3-PAK 0.6 mg/0.1 mL (18 mg/3 mL) injection, 1.8 mg by Other route daily., Disp: , Rfl: 2    Allergies  Decadron  [dexamethasone] and Dexamethasone sodium phosphate    Family History   Problem Relation Age of Onset   ??? Heart failure Mother    ??? Diabetes Father        Social History  Social History   Substance Use Topics   ??? Smoking status: Former Smoker     Packs/day: 1.50     Years: 40.00     Quit date: 03/01/2007   ??? Smokeless tobacco: Never Used   ??? Alcohol use No       Review of Systems     Constitutional: Negative for fever, positive for fatigue  Eyes: Negative for visual changes.  ENT: Negative for sore throat.  Cardiovascular: Negative for chest pain.  Respiratory: Positive for baseline SOB secondary to COPD with home oxygen requirement  Gastrointestinal: Marked abdominal distension, with leakage of serous fluid from drain site, and inflammation about it with tenderness.  Genitourinary: Negative for dysuria.  Musculoskeletal: Negative for back pain.  Skin: Positive for rash & inflammation on abdomen.  Neurological: Negative for headaches, focal weakness or numbness.    Physical Exam     ED Triage Vitals [04/18/17 1724]   Enc Vitals Group      BP 144/73      Heart Rate 94      SpO2 Pulse       Resp 14      Temp 36.8 ??C (98.2 ??F)      Temp Source Skin      SpO2 100 %      Weight       Height       Head Circumference       Peak Flow       Pain Score       Pain Loc       Pain Edu?       Excl. in GC?      Constitutional: Alert and oriented. Chronically-ill appearing, obese, and in pain.  Eyes: Conjunctivae are normal.  ENT       Head: Normocephalic and atraumatic.       Nose: No congestion.       Mouth/Throat: Mucous membranes are moist.       Neck: No stridor.  Hematological/Lymphatic/Immunilogical: No cervical lymphadenopathy.  Cardiovascular: Tachycardic, regular rhythm. Normal and symmetric distal pulses are present in all extremities.  Respiratory: Increased respiratory effort. Breath sounds are normal.  Gastrointestinal: Some degree of induration and tenderness in central band of erythema at level of umbilicus, with further erythema over leaking site of previous LVP. Bowel sounds normal.  Musculoskeletal: Normal range of motion in all extremities.       Right lower leg: No tenderness or edema.       Left lower leg: No tenderness or edema.  Neurologic: Normal speech and language. No gross focal neurologic deficits are appreciated.  Skin: Abdominal skin rash  and inflammation as noted above.   Psychiatric: Mood and affect are normal. Speech and behavior are normal.      EKG     INCOMPLETE RIGHT BUNDLE BRANCH BLOCK  LEFT ANTERIOR FASCICULAR BLOCK  ABNORMAL ECG  WHEN COMPARED WITH ECG OF 19-Aug-2016 23:47,  PREMATURE SUPRAVENTRICULAR BEATS ARE NO LONGER PRESENT  T WAVE INVERSION LESS EVIDENT IN LATERAL LEADS    Radiology CXR: pending      Procedures     Mindi Curling, MBBS  PGY-1 Emergency Medicine         Mindi Curling, MD  Resident  04/18/17 [phone]

## 2017-04-19 NOTE — Unmapped (Signed)
Medicine Procedure Service (MDM) Consultation    Active Problems:    * No active hospital problems. *      Kim Hull is a 71 y.o. y/o female that presents to [facility] with abdominal pain.     Patient seen in for consideration of diagnostic paracentesis per request of the primary team.  We performed a bedside ultrasound and images are reproduced below.  We are unable to perform the procedure at this time due to insufficient fluid pocket.          We were also asked to examine the surrounding skin from most recent LVP site as there is concern for skin/soft tissue infection. We examined the area and did not find a focal fluid collection to suggest abscess. We did see diffuse cobblestoning of the subcutaneous tissue around the area consistent with cellulitis in this clinical context.         Thank you for this consultation.  We will sign off, please call Med M at [phone] if the clinical situation changes and we will reevaluate.

## 2017-04-19 NOTE — Unmapped (Signed)
Patient rounding complete, call bell in reach, bed locked and in lowest position, patient belongings at bedside and within reach of patient.  Patient updated on plan of care.

## 2017-04-20 MED ORDER — CEPHALEXIN 500 MG CAPSULE
ORAL_CAPSULE | Freq: Three times a day (TID) | ORAL | 0 refills | 0.00000 days | Status: CP
Start: 2017-04-20 — End: 2017-11-07

## 2017-04-20 MED ORDER — DOXYCYCLINE HYCLATE 100 MG CAPSULE
ORAL_CAPSULE | Freq: Two times a day (BID) | ORAL | 0 refills | 0.00000 days | Status: CP
Start: 2017-04-20 — End: 2017-11-07

## 2017-04-20 NOTE — Unmapped (Signed)
OCCUPATIONAL THERAPY  Evaluation (04/20/17 0930)    Patient Name:  Kim Hull       Medical Record Number: [id number]   Date of Birth: May 30, 1946  Sex: Female          OT Treatment Diagnosis:  Decreased activity tolerance, t/f status, and functional mobility     Assessment  Kim Hull is a 71 y.o. 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 Pt presents to acute OT with decreased activity tolerance, t/f status, and functional mobility impacting ADL performance.Based on the daily activity AM-PAC raw score of 20/24, the pt is considered to be 38.32% impaired with self care. This indicates pt would be appropriate for post acute OT 3x/wk and would benefit from acute OT 2-3x/wk to maximize functional independence in home safety.          Activity Tolerance During Today's Session  Patient tolerated treatment well    Plan  Planned Frequency of Treatment:  1-2x per day for: 2-3x week       Planned Interventions:  Adaptive equipment;ADL retraining;Balance activities;Compensatory tech. training;Bed mobility;Conservation;Education - Patient;Education - Family / caregiver;Endurance activities;Safety education;Therapeutic exercise;Functional mobility;Transfer training;Home exercise program;UE Strength / coordination exercise;Range of motion    Post-Discharge Occupational Therapy Recommendations:  OT Post Acute Discharge Recommendations: 3x weekly    ;    OT DME Recommendations: None    GOALS:   Patient and Family Goals: To return to PLOF    Long Term Goal #1: Pt will maximize functional independence in 6 weeks       Short Term:  Pt will complete BSC t/f with mod I   Time Frame : 2 weeks  Pt will complete standing ADL for >/=7 minutes with mod I and no SOB noted   Time Frame : 2 weeks  Pt will complete LBD with setup A and LRAD    Time Frame : 2 weeks                  Prognosis: Good  Positive Indicators:  PLOF  Barriers to Discharge: Endurance deficits;Inability to safely perform ADLS    Subjective  Current Status Pt received supine in bed and left sitting EOB with all immediate needs met and RN aware  Prior Functional Status Pt reports indpendent with ADL and using QPC for functional mobility, son and DIL assist with grocery shopping and cooking, also receives meals on wheels    Medical Tests / Procedures: Reviewed  Services patient receives: OT;PT  Patient / Caregiver reports: My stomach pain is gone now thank goodness    Past Medical History:   Diagnosis Date   ??? Compensated HCV cirrhosis (CMS-HCC)    ??? COPD (chronic obstructive pulmonary disease) (CMS-HCC)    ??? Diabetes type 2, controlled (CMS-HCC)    ??? Essential hypertension 03/18/2010   ??? Iron deficiency anemia    ??? Liver cancer (CMS-HCC)    ??? OSA on CPAP 01/23/2014   ??? Pulmonary hypertension (CMS-HCC) 01/23/2014   ??? Varices, gastric     Social History   Substance Use Topics   ??? Smoking status: Former Smoker     Packs/day: 1.50     Years: 40.00     Quit date: 03/01/2007   ??? Smokeless tobacco: Never Used   ??? Alcohol use No      Past Surgical History:   Procedure  Laterality Date   ??? APPENDECTOMY     ??? HYSTERECTOMY     ??? PR UPPER GI ENDOSCOPY,DIAGNOSIS N/A 04/16/2016    Procedure: UGI ENDO, INCLUDE ESOPHAGUS, STOMACH, & DUODENUM &/OR JEJUNUM; DX W/WO COLLECTION SPECIMN, BY BRUSH OR WASH;  Surgeon: Janyth Pupa, MD;  [facility];  Service: Gastroenterology   ??? PR UPPER GI ENDOSCOPY,LIGAT VARIX N/A 08/20/2016    Procedure: UGI ENDO; W/BAND LIG ESOPH &/OR GASTRIC VARICES;  Surgeon: Vonda Antigua, MD;  Location: [facility];  Service: Gastroenterology    Family History   Problem Relation Age of Onset   ??? Heart failure Mother    ??? Diabetes Father         Decadron  [dexamethasone] and Dexamethasone sodium phosphate     Objective Findings  Precautions / Restrictions  Non-applicable    Weight Bearing  Non-applicable    Required Braces or Orthoses  Non-applicable    Communication Preference  Verbal    Pain  No c/o pain    Equipment / Environment  Vascular access (PIV, TLC, Port-a-cath, PICC);Supplemental oxygen (3L )    Living Situation   Living environment: Trailer   Lives With: Alone (son lives nearby that can assist prn)   Home Living: One level home;Stairs to enter with rails;Tub/shower unit;Tub bench;Standard height toilet;Bedside commode   Equipment available at home: [employer];Bedside commode;Tub bench   Rail placement (outside): Rail on left side        Cognition   Orientation Level:  Oriented x 4   Arousal/Alertness:  Appropriate responses to stimuli   Attention Span:  Appears intact   Memory:  Appears intact   Following Commands:  Follows all commands and directions without difficulty   Safety Judgment:  Good awareness of safety precautions   Awareness of Errors:  Good awareness of safety precautions   Problem Solving:  Able to problem solve independently   Comments:      Vision / Perception  Vision: Wears glasses all the time  Perception: Appears intact       Hand Function  Hand Dominance: R  B grip WFL     Skin Inspection  All visible skin appears intact    ROM / Strength/Coordination  UE ROM/ Strength/ Coordination: BUE grossly WFL  LE ROM/ Strength/ Coordination: Defer to PT     Sensation:  No c/o numbness/tingling    Balance:  sitting balance=independent, standing balance=SBA with RW    Mobility/Gait/Transfers: sup-sit=SBA with HOB elevated and use of bedrails, sit-stand=SBA with RW, functional mobility=SBA with RW, BSC t/f=SBA with RW    ADL:  Bathing: SBA seated  Grooming: SBA standing sinkside  Dressing: UE=setup A, LE=SBA   Eating: independent  Toileting: SBA for t/f and balance for pericare/clothing mgmt       Vitals/ Orthostatics:  At Rest: NAD  With Activity: NAD  Orthostatics: NAD    Interventions Performed During Today's Session: AMPAC=20/24, role of OT, POC, safety edu, importance of OOB, ECS/work simplification strategies, activity pacing/monitoring, functional mobility, ADL retraining, comp tech training    OT G-codes  Functional Assessment Tool Used: AMPAC  Score: 20/24  Functional Limitation: Self care  Self Care Current Status (Z6109): At least 20 percent but less than 40 percent impaired, limited or restricted  Self Care Goal Status (U0454): At least 1 percent but less than 20 percent impaired, limited or restricted  Rationale: 38.32% ADL impairment    Eval Duration (OT): 30 Min.  Medical Staff Made Aware: RN, Bronson Ing     I attest that I have reviewed the above information.  Signed: Clayborne Artist, OT  Filed 04/20/2017

## 2017-04-20 NOTE — Unmapped (Signed)
Daily Progress Note    Interval History: No acute events overnight. Patient is pleasant and AAOx3. Patient does report gas and bloating. Erythematous region on abdomen improving with tender nodule at para site.     Assessment/Plan:     LOS: 1 day      Kim Hull is a 71 y.o. 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   ??  Sepsis, unclear source  Presented with tachycardia and elevated lactate (3.1, now downtrending). HR elevated into the 110s but improved after 1 L of IV fluid. Most likely sources of infection are SBP vs cellulitis with cellulitis being most likely given abd erythema that has improved after antibiotics. Of note, she has been on cipro for large leukocyte on UA seen at OSH, symptoms now resolved. UA here shows large leukocytes and occasional bacteria. UCx pending. Patient initially reported increased SOB over last few days, no increased O2 requirement and CXR unremarkable. Med M was consulted and did not find window for paracentesis.   - erythematous area on abdomen improved s/p antibiotics  - f/u urine culture  - f/u blood cultures, if negative and no change, will de-escalate antibiotics 9/19  - ceftriaxone 1 g x 5 days   - vancomycin, dosed by pharmacy  - albumin 1.5g/kg (150g), will be redosed on 9/20 with 1g/kg   ??  Acute on Chronic Kidney Injury  Baseline over last year between 1-1.5. Cr 1.69 5 days ago, now elevated to 1.98. Endorses decreased urine output. Likely pre-renal in the setting of infection, however HRS is on the differential. She is s/p 1L of fluid. Given concern for SBP, will empirically treat with albumin  - albumin 1.5 g/kg on day 1, albumin 1g/kg on day 3 (9/20)  - holding lasix in setting of possible AKI  ??  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 Follows with Dr. Ruffin Frederick. Receives period LVPs with most recent on 9/12. Hx of varices with banding January 2018. 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   - lactulose TID, titrate to 3-5 BM per day  - rifaximin  - holding lasix in setting of possible AKI  ??  HCC  S/p ablation in 2016  ??  DMT2  Hold home metformin and victoza. Holding home gabapentin.  - SSI insulin  - holding home gabapentin in setting of worsening renal function  - A1C  ??  COPD, 3L of O2 at baseline  No wheezing on exam. No increased O2 requirement. Mild SOB  - symbicort BID  - spiriva daily  - albuterol prn    Objective:    Vital signs in last 24 hours:  Temp:  [36 ??C-36.8 ??C] 36 ??C  Heart Rate:  [81-101] 81  SpO2 Pulse:  [100] 100  Resp:  [18] 18  BP: (129-177)/(32-77) 132/59  SpO2:  [93 %-100 %] 100 %  BMI (Calculated):  [43.7] 43.7    Intake/Output last 3 shifts:  I/O last 3 completed shifts:  In: 1200 [IV Piggyback:1200]  Out: -   Intake/Output this shift:  No intake/output data recorded.    Physical Exam:  Vitals:    04/19/17 0900   BP: 132/59   Pulse: 81   Resp: 18   Temp: 36 ??C  SpO2: 100%     GENERAL: No acute distress. Laying in bed. Pleasant. Morbidly obese   CARDIOVASCULAR: normal rate, regular rhythm, no murmurs  RESPIRATORY: Lungs clear to auscultation in anterior lung fields, distant breath sounds posteriorly, no wheezing  ABDOMEN: Soft, distended, TTP diffusely, no rebound, no guarding, bowel sounds present, small tender, nonpurulent, erythematous nodule at paracentesis site, large area of erythema no longer present, no fluid leaking from from LVP site  EXTREMITIES:  No lower extremity edema  SKIN: Scattered ecchymoses on extremities  NEUROLOGIC: Appropriate mood and affect. Alert and oriented to person, place, time, and situation. Moving all four extremities spontaneously. No asterixis     Medications   albuterol (PROVENTIL HFA;VENTOLIN HFA) 90 mcg/actuation inhaler 1 puff (not administered) budesonide-formoterol (SYMBICORT) 160-4.5 mcg/actuation inhaler 2 puff (2 puffs Inhalation Given 04/19/17 0824)   cholecalciferol (vitamin D3) tablet 400 Units (400 Units Oral Given 04/19/17 0825)   ferrous sulfate tablet 325 mg (325 mg Oral Given 04/19/17 0824)   oxyCODONE (ROXICODONE) immediate release tablet 5 mg (5 mg Oral Given 04/19/17 1303)   rifAXIMin (Xifaxan) tablet 550 mg (550 mg Oral Given 04/19/17 0825)   tiotropium (SPIRIVA) 18 mcg inhalation capsule 18 mcg (18 mcg Inhalation Given 04/19/17 0825)   heparin (porcine) injection 5,000 Units (5,000 Units Subcutaneous Given 04/19/17 1303)   acetaminophen (TYLENOL) tablet 650 mg (not administered)   lactulose (CEPHULAC) packet 20 g (20 g Oral Given 04/19/17 1303)   cefTRIAXone (ROCEPHIN) 1 g in sodium chloride 0.9 % (NS) 100 mL IVPB-connector bag (1 g Intravenous New Bag 04/19/17 0718)   vancomycin (VANCOCIN) IVPB 1 g (premix) (0 mg Intravenous Stopped 04/19/17 0005)   dextrose 50 % in water (D50W) solution 12.5 g (not administered)   insulin lispro (HumaLOG) injection 0-12 Units (2 Units Subcutaneous Given 04/19/17 1651)   lidocaine-EPINEPHrine (XYLOCAINE W/EPI) 1 %-1:100,000 injection 1 mL (1 mL Infiltration Given 04/18/17 1853)   sodium chloride 0.9% (NS BOLUS) bolus 1,000 mL (0 mL Intravenous Stopped 04/18/17 2021)   cefepime (MAXIPIME) 2 g in dextrose 100 mL IVPB (premix) (0 g Intravenous Stopped 04/18/17 1956)   metroNIDAZOLE (FLAGYL) IVPB 500 mg (500 mg Intravenous New Bag 04/18/17 2031)   albumin human 25 % bottle 150 g (150 g Intravenous New Bag 04/19/17 0107)   influenza vaccine quad syringe (FLULAVAL) (6 MOS & UP) (PF) 2018-19 (0.5 mL Intramuscular Given 04/19/17 1447)     Valorie Roosevelt  Internal Medicine, PGY1  [phone]

## 2017-04-20 NOTE — Unmapped (Signed)
Follow up with Dr. Richardine Service at [facility] in [place] on 04/25/17 at 1:20pm.

## 2017-04-20 NOTE — Unmapped (Signed)
Tolerated setup of cpap well

## 2017-04-20 NOTE — Unmapped (Signed)
Physician Discharge Summary    Identifying Information:   Kim Hull  1946-05-28  [id number]    Admit date: 04/18/2017    Discharge date: 04/20/2017     Discharge Service: Med General Doristine Counter (MDU)    Discharge Attending Physician: [name], MD    Discharge to: Home    Discharge Diagnoses:  Principal Problem:    Abdominal pain  Active Problems:    Type II diabetes mellitus (CMS-HCC)    Essential hypertension (RAF-HCC)    COPD (chronic obstructive pulmonary disease) (CMS-HCC)    Pulmonary hypertension    Morbid (severe) obesity due to excess calories (CMS-HCC)    Compensated cirrhosis related to hepatitis C virus (HCV) (CMS-HCC)    Pancytopenia (CMS-HCC)    Portal hypertension (CMS-HCC)    CKD (chronic kidney disease) stage 3, GFR 30-59 ml/min  Resolved Problems:    * No resolved hospital problems. 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  Below are details of her 2 day stay at [facility] according to problem:   ??  Cellulitis  Initial concern for SPB but Med M was consulted and did not find window for paracentesis. Cellulitis present on abdomen and this was at first treated with broad spectrum antibiotics until blood cultures resulted in no growth.  Then transitioned to doxycycline and keflex for total 7 days.  ??  Chronic Kidney Injury (stage 3) with mild increase in Creatinine (now improved):  Cr 1.69 5 days ago and elevated to 1.98 on admit . Endorses decreased urine output. On low dose of lasix at home.  Probably some mild dehydration. She is s/p 1L of fluid. Given the initial concern for SBP, was also treated with albumin 1.5 g/kg on admit and lasix held.  However, not likely SBP and will restart low dose lasix.  ??  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 Follows with Dr. Ruffin Frederick. Receives regular LVPs with most recent on 9/12 and has needed more recently. Hx of varices with banding January 2018. 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    and lactulose and rifaximin.  Diuretics as above.  Overall, not decompensated, but still not transplant candidate per notes from Jan 2018 and would continue regular LVPs (next is 9/26) and continued conversations about goals of care.  ??  HCC  S/p ablation in 2016  ??  DMT2  Home metformin and victoza initially held, but will restart upon dc.  She is close to cut off for stopping metformin and will have PCP continue to monitor and help with management in the future. She has not seen her PCP since Dec 2017 per her report and I have encouraged her to follow up for DM and other care with Dr. Greggory Stallion.  ??  COPD, 3L of O2 at baseline  No wheezing on exam. No increased O2 requirement. Mild SOB but stable and will continue symbicort BID, spiriva daily and albuterol prn    Procedures:  None  No admission procedures  for hospital encounter.  ______________________________________________________________________    Discharge Day Services:  BP 144/68  - Pulse 91  - Temp 35.6 ??C (96.1 ??F) (Oral)  - Resp 18  - Ht 170.2 cm (5' 7.01)  - Wt (!) 126.7 kg (279 lb 6.4 oz)  - LMP  (LMP Unknown)  - SpO2 98%  - BMI 43.75 kg/m??   Pt seen on the day of discharge and determined appropriate for discharge.    Condition at Discharge: stable  ______________________________________________________________________  Discharge Medications:     Your Medication List      STOP taking these medications    ciprofloxacin HCl 500 MG tablet  Commonly known as:  CIPRO        START taking these medications    cephalexin 500 MG capsule  Commonly known as:  KEFLEX  Take 1 capsule (500 mg total) by mouth every eight (8) hours. for 5 days     doxycycline 100 MG capsule  Commonly known as:  VIBRAMYCIN  Take 1 capsule (100 mg total) by mouth every twelve (12) hours. for 5 days        CONTINUE taking these medications    ACCU-CHEK AVIVA PLUS TEST STRP Strp  Generic drug:  blood sugar diagnostic  Frequency:PHARMDIR   Dosage:0.0     Instructions:  Note:Use three time per day. Dx. Type 2 Diabetes Mellitus Dose: 1     acetaminophen 325 MG tablet  Commonly known as:  TYLENOL  Take 2 tablets (650 mg total) by mouth every six (6) hours as needed.     blood-glucose meter kit  Use as directed. E11.9     budesonide-formoterol 160-4.5 mcg/actuation inhaler  Commonly known as:  SYMBICORT  Inhale 2 puffs Two (2) times a day.     carvedilol 3.125 MG tablet  Commonly known as:  COREG  Take by mouth Two (2) times a day.     cholecalciferol 400 unit Tab  Generic drug:  cholecalciferol (vitamin D3)  Take 400 Units by mouth daily. Frequency:BID   Dosage:400   UNIT  Instructions:  Note:Dose: 400UNIT     ferrous sulfate 325 (65 FE) MG tablet  Take by mouth daily with breakfast.     furosemide 20 MG tablet  Commonly known as:  LASIX  Take 20 mg by mouth Two (2) times a day.     gabapentin 300 MG capsule  Commonly known as:  NEURONTIN  Take 300 mg by mouth daily.     hydrOXYzine 25 MG tablet  Commonly known as:  ATARAX  Take 25 mg by mouth daily as needed.     lactulose 10 gram/15 mL solution  Commonly known as:  CHRONULAC  Take 30 g by mouth daily.     metFORMIN 500 MG 24 hr tablet  Commonly known as:  GLUCOPHAGE-XR  Take 500 mg by mouth Two (2) times a day.     oxyCODONE 5 MG immediate release tablet  Commonly known as:  ROXICODONE  Take 5 mg by mouth every eight (8) hours as needed.     PROAIR HFA 90 mcg/actuation inhaler  Generic drug:  albuterol  Inhale 2 inhalation every four (4) hours as needed.     rifAXIMin 550 mg Tab  Commonly known as:  Xifaxan  Take 1 tablet (550 mg total) by mouth Two (2) times a day.     tiotropium 18 mcg inhalation capsule  Commonly known as:  SPIRIVA WITH HANDIHALER  Place 1 capsule (18 mcg total) into  inhaler and inhale daily.     VICTOZA 3-PAK 0.6 mg/0.1 mL (18 mg/3 mL) injection  Generic drug:  liraglutide  1.8 mg by Other route daily.     ZyrTEC 10 MG tablet  Generic drug:  cetirizine  Take 10 mg by mouth. Frequency:QD   Dosage:10   MG  Instructions:  Note:Dose: 10MG           ______________________________________________________________________  Pending Test Results (if blank, then none):   Order Current Status    Blood Culture Preliminary result    Blood Culture Preliminary result          Most Recent Labs:  Microbiology Results (last day)     Procedure Component Value Date/Time Date/Time    Blood Culture [[id number]]  (Normal) Collected:  04/18/17 1848    Lab Status:  Preliminary result Specimen:  Blood from Peripheral Updated:  04/19/17 1930     Blood Culture, Routine No Growth at 24 hours    Blood Culture [[id number]]  (Normal) Collected:  04/18/17 1840    Lab Status:  Preliminary result Specimen:  Blood from Peripheral Updated:  04/19/17 1930     Blood Culture, Routine No Growth at 24 hours          Lab Results   Component Value Date    WBC 1.5 (L) 04/19/2017    HGB 6.9 (L) 04/19/2017    HCT 22.4 (L) 04/19/2017    PLT 89 (L) 04/19/2017       Lab Results   Component Value Date    NA 142 04/19/2017    K 3.8 04/19/2017    CL 100 04/19/2017    CO2 28.0 04/19/2017    BUN 29 (H) 04/19/2017    CREATININE 1.66 (H) 04/19/2017    CALCIUM 9.1 04/19/2017    MG 1.6 04/19/2017    PHOS 3.6 04/19/2017       Lab Results   Component Value Date    ALKPHOS 67 04/18/2017    BILITOT 0.8 04/18/2017    BILIDIR 0.30 08/26/2016    PROT 7.5 04/18/2017    ALBUMIN 3.4 (L) 04/18/2017    ALT 29 04/18/2017    AST 42 (H) 04/18/2017    GGT 54 (H) 07/12/2016       Lab Results   Component Value Date    PT 15.2 (H) 04/19/2017    INR 1.33 04/19/2017    APTT 27.8 08/19/2016     Hospital Radiology:  Xr Chest Portable    Result Date: 04/19/2017  EXAM: CHEST ONE VIEW DATE: 04/18/2017 9:12 PM ACCESSION: [id number] UN DICTATED: 04/18/2017 9:30 PM INTERPRETATION LOCATION: Main [facility] CLINICAL INDICATION: 71 years old Female with DYSPNEA--  COMPARISON: Chest radiograph 08/23/2016. TECHNIQUE: Portable  AP view of the chest obtained at 21:04 hours. CONCLUSIONS: Lungs are well-inflated. A well-defined, ovoid right lower lobe nodule is unchanged from prior radiograph. Prominence of the central pulmonary vasculature without overt edema. No sizable left pleural effusion. The right costophrenic angle is incompletely visualized. No pneumothoraces. Cardiomediastinal silhouette is within normal limits. Aortic arch calcifications.    Ed Pocus Cyst Aspiration Guidance    Result Date: 04/18/2017  Focused abdominal ultrasound to evaluate for ascites. RLQ and LLQ scanned. No sufficiently large pockets of ascites. Paracentesis not performed.       ______________________________________________________________________    Discharge Instructions:   Activity Instructions     Activity as tolerated             Diet Instructions     Discharge diet (  specify)       Discharge Nutrition Therapy:  Consistent Carbohydrate    Low salt.  We recommend that you stick to a lower sodium (less than 2 grams/day of salt) diet to help you keep fluid from building up in your legs, lungs, and abdomen.                   Follow Up instructions and Outpatient Referrals     Call MD for:  difficulty breathing, headache or visual disturbances       Call MD for:  persistent nausea or vomiting       Call MD for:  severe uncontrolled pain       Call MD for:  temperature >38.5 Celsius       Discharge instructions       Ms. Polkowski, you were admitted to [facility] for abdominal pain that was likely due to the swelling/infection in the soft tissue of your abdomen.  This seems to be improving with antibiotics and we recommend finishing a course of antibiotics (keflex an doxycycline) to help treat the infection.    We recommend continued close follow up with your primary care provider to discuss your recent stay and discuss your chronic medical health needs.  Take your medications and a list of questions so your provider can better guide your care.    Stick to a diabetic and low salt diet.    Stop taking your ciprofloxacin.    Continue to follow up with the [facility] group for ongoing draining of your abdominal fluid and to have dicussioins regarding long-term options for your liver disease care.               Appointments which have been scheduled for you    Apr 27, 2017  1:00 PM EDT  NURSE VISIT with [facility] NURSE  [facility]) [address]  [place] [address]  [phone]   May 13, 2017  UGI ENDOSCOPY; WITH BIOPSY, SINGLE OR MULTIPLE with Janyth Pupa, MD  GI [facility]IOP [facility]) [address]  [place] [address]  [phone]    Additional instructions:      Follow up with Dr. Richardine Service at [facility] in [place] on 04/25/17 at 1:20pm.                    Length of Discharge: I spent greater than 30 mins in the discharge of this patient.

## 2017-04-20 NOTE — Unmapped (Signed)
Problem: Fall Risk (Adult)  Goal: Identify Related Risk Factors and Signs and Symptoms  Related risk factors and signs and symptoms are identified upon initiation of Human Response Clinical Practice Guideline (CPG).   Outcome: Progressing   04/20/17 0118   Fall Risk (Adult)   Related Risk Factors (Fall Risk) age-related changes;gait/mobility problems;environment unfamiliar   Signs and Symptoms (Fall Risk) presence of risk factors     Goal: Absence of Fall  Patient will demonstrate the desired outcomes by discharge/transition of care.   Outcome: Progressing      Problem: Pain, Chronic (Adult)  Goal: Identify Related Risk Factors and Signs and Symptoms  Related risk factors and signs and symptoms are identified upon initiation of Human Response Clinical Practice Guideline (CPG).   Outcome: Progressing    Goal: Acceptable Pain/Comfort Level and Functional Ability  Patient will demonstrate the desired outcomes by discharge/transition of care.   Outcome: Progressing      Problem: VTE, DVT and PE (Adult)  Goal: Signs and Symptoms of Listed Potential Problems Will be Absent, Minimized or Managed (VTE, DVT and PE)  Signs and symptoms of listed potential problems will be absent, minimized or managed by discharge/transition of care (reference VTE, DVT and PE (Adult) CPG).   Outcome: Progressing

## 2017-04-25 ENCOUNTER — Ambulatory Visit: Payer: Medicare Other | Attending: Anesthesiology | Admitting: Anesthesiology

## 2017-04-25 ENCOUNTER — Encounter: Payer: Self-pay | Admitting: Anesthesiology

## 2017-04-25 VITALS — BP 151/68 | Temp 98.4°F | Resp 18 | Ht 66.5 in | Wt 280.0 lb

## 2017-04-25 DIAGNOSIS — M5387 Other specified dorsopathies, lumbosacral region: Secondary | ICD-10-CM | POA: Insufficient documentation

## 2017-04-25 DIAGNOSIS — D509 Iron deficiency anemia, unspecified: Secondary | ICD-10-CM | POA: Diagnosis not present

## 2017-04-25 DIAGNOSIS — G894 Chronic pain syndrome: Secondary | ICD-10-CM | POA: Diagnosis not present

## 2017-04-25 DIAGNOSIS — M5386 Other specified dorsopathies, lumbar region: Secondary | ICD-10-CM

## 2017-04-25 DIAGNOSIS — M25511 Pain in right shoulder: Secondary | ICD-10-CM | POA: Diagnosis not present

## 2017-04-25 DIAGNOSIS — M5136 Other intervertebral disc degeneration, lumbar region: Secondary | ICD-10-CM | POA: Diagnosis not present

## 2017-04-25 DIAGNOSIS — M542 Cervicalgia: Secondary | ICD-10-CM | POA: Insufficient documentation

## 2017-04-25 DIAGNOSIS — M25562 Pain in left knee: Secondary | ICD-10-CM | POA: Diagnosis present

## 2017-04-25 DIAGNOSIS — I700 Atherosclerosis of aorta: Secondary | ICD-10-CM | POA: Diagnosis not present

## 2017-04-25 DIAGNOSIS — M545 Low back pain, unspecified: Secondary | ICD-10-CM

## 2017-04-25 DIAGNOSIS — M25561 Pain in right knee: Secondary | ICD-10-CM | POA: Diagnosis present

## 2017-04-25 MED ORDER — OXYCODONE HCL 5 MG PO TABS: 5.0000 mg | ORAL_TABLET | Freq: Three times a day (TID) | ORAL | 0 refills | Status: DC

## 2017-04-25 NOTE — Progress Notes (Signed)
Nursing Pain Medication Assessment:  Safety precautions to be maintained throughout the outpatient stay will include: orient to surroundings, keep bed in low position, maintain call bell within reach at all times, provide assistance with transfer out of bed and ambulation.  Medication Inspection Compliance: Pill count conducted under aseptic conditions, in front of the patient. Neither the pills nor the bottle was removed from the patient's sight at any time. Once count was completed pills were immediately returned to the patient in their original bottle.  Medication: Oxycodone IR Pill/Patch Count: 67 of 75 pills remain Pill/Patch Appearance: Markings consistent with prescribed medication Bottle Appearance: Standard pharmacy container. Clearly labeled. Filled Date: 09 / 19 / 2018 Last Medication intake:  Today

## 2017-04-25 NOTE — Progress Notes (Signed)
Subjective:  Patient ID: Whitney Cline, female    DOB: Apr 14, 1946  Age: 71 y.o. MRN: [id number]  CC: Back Pain (low) and Knee Pain (bilateral)   Procedure: None   HPI [name] presents for reevaluation. [name] was seen about 2 months ago and has had some complicated GI issues since that time. She was reportedly in the hospital where she received a paracentesis that pulled several pounds with fluid from her abdomen. Since that time she is breathing better. She has continued to take her medications of oxycodone without difficulty. Based on her narcotic assessment sheet she continues to derive good lifestyle functional improvement with her medications. He keeps her low back pain hip pain and bilateral knee pain under good control. She also has diffuse body pain and this seems to be improved as well. No significant side effects are noted from the medications. Otherwise she is in her usual state of health today.  Outpatient Medications Prior to Visit  Medication Sig Dispense Refill  . ACCU-CHEK AVIVA PLUS test strip USE QID UTD  2  . albuterol (PROVENTIL HFA;VENTOLIN HFA) 108 (90 Base) MCG/ACT inhaler Inhale 2 puffs into the lungs every 6 (six) hours as needed for wheezing or shortness of breath.    Marland Kitchen albuterol (PROVENTIL) (2.5 MG/3ML) 0.083% nebulizer solution VVN Q 6 H PRF WHZ  12  . azelastine (ASTELIN) 0.1 % nasal spray Place into the nose.    . Blood Glucose Monitoring Suppl (FIFTY50 GLUCOSE METER 2.0) w/Device KIT Use as directed. E11.9    . budesonide-formoterol (SYMBICORT) 160-4.5 MCG/ACT inhaler Inhale 2 puffs into the lungs 2 (two) times daily.    . carisoprodol (SOMA) 350 MG tablet Take 1 tablet (350 mg total) by mouth 4 (four) times daily. 30 tablet 5  . carvedilol (COREG) 3.125 MG tablet Take 3.125 mg by mouth 2 (two) times daily with a meal.    . cephALEXin (KEFLEX) 250 MG capsule Take by mouth 4 (four) times daily.    . cetirizine (ZYRTEC) 10 MG tablet Take 10 mg by  mouth daily.    . cholecalciferol (VITAMIN D) 400 units TABS tablet Take 400 Units by mouth.    . cyclobenzaprine (FLEXERIL) 5 MG tablet TAKE 1 TABLET(5 MG) BY MOUTH THREE TIMES DAILY AS NEEDED FOR MUSCLE SPASMS 75 tablet 0  . furosemide (LASIX) 20 MG tablet Take 20 mg by mouth 2 (two) times daily.     Marland Kitchen gabapentin (NEURONTIN) 300 MG capsule Take 300 mg by mouth daily at 12 noon.    Marland Kitchen glucose blood test strip USE FOUR TIMES DAILY AS INSTRUCTED    . hydrOXYzine (ATARAX/VISTARIL) 25 MG tablet Take by mouth.    . Insulin Pen Needle (PEN NEEDLES) 32G X 4 MM MISC Use for injections humalog TID times daily,  then glargine daily accucheck viva machine    . lactulose, encephalopathy, (GENERLAC) 10 GM/15ML SOLN Take 30 g by mouth daily as needed.    . Lancets (ACCU-CHEK MULTICLIX) lancets USE ONCE D AS INSTRUCTED  6  . lidocaine (XYLOCAINE) 5 % ointment Apply topically.    . liraglutide (VICTOZA) 18 MG/3ML SOPN 1.8 mg.    . metFORMIN (GLUCOPHAGE) 500 MG tablet Take 1,000 mg by mouth 2 (two) times daily with a meal.    . methocarbamol (ROBAXIN) 750 MG tablet TAKE 1 TABLET(750 MG) BY MOUTH TWICE DAILY 60 tablet 0  . naloxone (NARCAN) 0.4 MG/ML injection Inject 0.4 mg into the vein as needed.    Marland Kitchen  Naloxone HCl 0.4 MG/0.4ML SOAJ Inject 1 Units as directed once daily as needed.    . rifaximin (XIFAXAN) 550 MG TABS tablet Take 550 mg by mouth 2 (two) times daily.    Marland Kitchen tiotropium (SPIRIVA) 18 MCG inhalation capsule Place 18 mcg into inhaler and inhale daily.    Marland Kitchen triamcinolone cream (KENALOG) 0.5 % Apply topically.    . ferrous sulfate 325 (65 FE) MG EC tablet Take by mouth.    . oxyCODONE (OXY IR/ROXICODONE) 5 MG immediate release tablet Take 1 tablet (5 mg total) by mouth 3 (three) times daily. 75 tablet 0   Facility-Administered Medications Prior to Visit  Medication Dose Route Frequency Provider Last Rate Last Dose  . dexamethasone (DECADRON) injection 10 mg  10 mg Other Once Molli Barrows, MD      .  dexamethasone (DECADRON) injection 4 mg  4 mg Other Once Molli Barrows, MD      . ropivacaine (PF) 2 mg/ml (0.2%) (NAROPIN) epidural 1 mL  1 mL Epidural Once Molli Barrows, MD      . ropivacaine (PF) 2 mg/mL (0.2%) (NAROPIN) injection 10 mL  10 mL Epidural Once Molli Barrows, MD        Review of Systems CNS: No dizziness or confusion Cardiac: No angina or palpitations GI: No constipation or abdominal pain  Objective:  BP (!) 151/68 (BP Location: Right Arm, Patient Position: Sitting, Cuff Size: Large)   Temp 98.4 F (36.9 C) (Oral)   Resp 18   Ht 5' 6.5" (1.689 m)   Wt 280 lb (127 kg)   SpO2 100% Comment: 3 liters o2 via [place]  BMI 44.52 kg/m    BP Readings from Last 3 Encounters:  04/25/17 (!) 151/68  03/01/17 (!) 155/72  01/03/17 (!) 164/70     Wt Readings from Last 3 Encounters:  04/25/17 280 lb (127 kg)  03/01/17 280 lb (127 kg)  01/03/17 283 lb (128.4 kg)     Physical Exam Pt is alert and oriented PERRL EOMI HEART IS RRR no murmur or rub LCTA no wheezing or rhales MUSCULOSKELETAL reveals some mild paraspinous muscle tenderness but no overt trigger points. Her muscle tone and bulk is good and motor function is at baseline.  Labs  Lab Results  Component Value Date   HGBA1C 9.4 (H) 10/29/2012   Lab Results  Component Value Date   CREATININE 1.70 (H) 08/19/2016    -------------------------------------------------------------------------------------------------------------------- Lab Results  Component Value Date   WBC 13.7 (H) 08/19/2016   HGB 6.5 (L) 08/19/2016   HCT 20.4 (L) 08/19/2016   PLT 246 08/19/2016   GLUCOSE 265 (H) 08/19/2016   ALT 19 08/19/2016   AST 47 (H) 08/19/2016   NA 133 (L) 08/19/2016   K 5.5 (H) 08/19/2016   CL 98 (L) 08/19/2016   CREATININE 1.70 (H) 08/19/2016   BUN 97 (H) 08/19/2016   CO2 22 08/19/2016   INR 1.1 10/28/2012   HGBA1C 9.4 (H) 10/29/2012     --------------------------------------------------------------------------------------------------------------------- Dg Chest 2 View  Result Date: 08/19/2016 CLINICAL DATA:  Dyspnea and wheezing x2 days wound EXAM: CHEST  2 VIEW COMPARISON:  Chest CT 08/12/2007, CXR 07/28/2013 0 FINDINGS: Unchanged right lower lobe well-circumscribed 3.3 cm mass dating back to 2009 consistent with a benign finding. Mild interstitial prominence noted bilaterally which may reflect bronchitic change. No alveolar consolidation, effusion or pneumothorax. Heart is top-normal in size. There is aortic atherosclerosis without aneurysm. IMPRESSION: Stable right lower lobe 3.3 cm mass  dating back to 2009 consistent with a benign finding. Mild interstitial prominence bilaterally which may reflect bronchitic change. No pulmonary consolidation. Aortic atherosclerosis. Electronically Signed   By: Ashley Royalty M.D.   On: 08/19/2016 14:20     Assessment & Plan:   Shanan was seen today for back pain and knee pain.  Diagnoses and all orders for this visit:  Chronic pain syndrome  Low back pain at multiple sites  Cervicalgia  Pain in joint of right shoulder  DDD (degenerative disc disease), lumbar  Iron deficiency anemia, unspecified iron deficiency anemia type  Low back derangement syndrome  Other orders -     Discontinue: oxyCODONE (OXY IR/ROXICODONE) 5 MG immediate release tablet; Take 1 tablet (5 mg total) by mouth 3 (three) times daily. -     oxyCODONE (OXY IR/ROXICODONE) 5 MG immediate release tablet; Take 1 tablet (5 mg total) by mouth 3 (three) times daily.        ----------------------------------------------------------------------------------------------------------------------  Problem List Items Addressed This Visit    None    Visit Diagnoses    Chronic pain syndrome    -  Primary   Low back pain at multiple sites       Relevant Medications   oxyCODONE (OXY IR/ROXICODONE) 5 MG immediate  release tablet   Cervicalgia       Pain in joint of right shoulder       DDD (degenerative disc disease), lumbar       Relevant Medications   oxyCODONE (OXY IR/ROXICODONE) 5 MG immediate release tablet   Iron deficiency anemia, unspecified iron deficiency anemia type       Low back derangement syndrome       Relevant Medications   oxyCODONE (OXY IR/ROXICODONE) 5 MG immediate release tablet        ----------------------------------------------------------------------------------------------------------------------  1. Chronic pain syndrome We'll refill her medications for the oxycodone for the next 2 months with return to clinic in 2 months. We have gone over the risks and benefits of chronic opioid therapy. I've also reviewed the [facility] practitioner database information and it is appropriate.  2. Low back pain at multiple sites Continue back stretching strengthening exercises with efforts at weight loss as reviewed again today  3. Cervicalgia As above  4. Pain in joint of right shoulder As above  5. DDD (degenerative disc disease), lumbar As above  6. Iron deficiency anemia, unspecified iron deficiency anemia type   7. Low back derangement syndrome     ----------------------------------------------------------------------------------------------------------------------  I am having Ms. Halsted maintain her albuterol, budesonide-formoterol, carvedilol, cetirizine, cholecalciferol, furosemide, gabapentin, metFORMIN, naloxone, tiotropium, glucose blood, albuterol, azelastine, FIFTY50 GLUCOSE METER 2.0, ACCU-CHEK AVIVA PLUS, hydrOXYzine, lidocaine, Pen Needles, triamcinolone cream, accu-chek multiclix, ferrous sulfate, Naloxone HCl, liraglutide, methocarbamol, carisoprodol, lactulose (encephalopathy), rifaximin, cephALEXin, cyclobenzaprine, and oxyCODONE. We will continue to administer dexamethasone, ropivacaine (PF) 2 mg/mL (0.2%), dexamethasone, and ropivacaine (PF) 2  mg/mL (0.2%).   Meds ordered this encounter  Medications  . DISCONTD: oxyCODONE (OXY IR/ROXICODONE) 5 MG immediate release tablet    Sig: Take 1 tablet (5 mg total) by mouth 3 (three) times daily.    Dispense:  75 tablet    Refill:  0    Do not fill until [id number]  . oxyCODONE (OXY IR/ROXICODONE) 5 MG immediate release tablet    Sig: Take 1 tablet (5 mg total) by mouth 3 (three) times daily.    Dispense:  75 tablet    Refill:  0    Do not fill until  [id number]   Patient's Medications  New Prescriptions   No medications on file  Previous Medications   ACCU-CHEK AVIVA PLUS TEST STRIP    USE QID UTD   ALBUTEROL (PROVENTIL HFA;VENTOLIN HFA) 108 (90 BASE) MCG/ACT INHALER    Inhale 2 puffs into the lungs every 6 (six) hours as needed for wheezing or shortness of breath.   ALBUTEROL (PROVENTIL) (2.5 MG/3ML) 0.083% NEBULIZER SOLUTION    VVN Q 6 H PRF WHZ   AZELASTINE (ASTELIN) 0.1 % NASAL SPRAY    Place into the nose.   BLOOD GLUCOSE MONITORING SUPPL (FIFTY50 GLUCOSE METER 2.0) W/DEVICE KIT    Use as directed. E11.9   BUDESONIDE-FORMOTEROL (SYMBICORT) 160-4.5 MCG/ACT INHALER    Inhale 2 puffs into the lungs 2 (two) times daily.   CARISOPRODOL (SOMA) 350 MG TABLET    Take 1 tablet (350 mg total) by mouth 4 (four) times daily.   CARVEDILOL (COREG) 3.125 MG TABLET    Take 3.125 mg by mouth 2 (two) times daily with a meal.   CEPHALEXIN (KEFLEX) 250 MG CAPSULE    Take by mouth 4 (four) times daily.   CETIRIZINE (ZYRTEC) 10 MG TABLET    Take 10 mg by mouth daily.   CHOLECALCIFEROL (VITAMIN D) 400 UNITS TABS TABLET    Take 400 Units by mouth.   CYCLOBENZAPRINE (FLEXERIL) 5 MG TABLET    TAKE 1 TABLET(5 MG) BY MOUTH THREE TIMES DAILY AS NEEDED FOR MUSCLE SPASMS   FERROUS SULFATE 325 (65 FE) MG EC TABLET    Take by mouth.   FUROSEMIDE (LASIX) 20 MG TABLET    Take 20 mg by mouth 2 (two) times daily.    GABAPENTIN (NEURONTIN) 300 MG CAPSULE    Take 300 mg by mouth daily at 12 noon.   GLUCOSE BLOOD TEST  STRIP    USE FOUR TIMES DAILY AS INSTRUCTED   HYDROXYZINE (ATARAX/VISTARIL) 25 MG TABLET    Take by mouth.   INSULIN PEN NEEDLE (PEN NEEDLES) 32G X 4 MM MISC    Use for injections humalog TID times daily,  then glargine daily accucheck viva machine   LACTULOSE, ENCEPHALOPATHY, (GENERLAC) 10 GM/15ML SOLN    Take 30 g by mouth daily as needed.   LANCETS (ACCU-CHEK MULTICLIX) LANCETS    USE ONCE D AS INSTRUCTED   LIDOCAINE (XYLOCAINE) 5 % OINTMENT    Apply topically.   LIRAGLUTIDE (VICTOZA) 18 MG/3ML SOPN    1.8 mg.   METFORMIN (GLUCOPHAGE) 500 MG TABLET    Take 1,000 mg by mouth 2 (two) times daily with a meal.   METHOCARBAMOL (ROBAXIN) 750 MG TABLET    TAKE 1 TABLET(750 MG) BY MOUTH TWICE DAILY   NALOXONE (NARCAN) 0.4 MG/ML INJECTION    Inject 0.4 mg into the vein as needed.   NALOXONE HCL 0.4 MG/0.4ML SOAJ    Inject 1 Units as directed once daily as needed.   RIFAXIMIN (XIFAXAN) 550 MG TABS TABLET    Take 550 mg by mouth 2 (two) times daily.   TIOTROPIUM (SPIRIVA) 18 MCG INHALATION CAPSULE    Place 18 mcg into inhaler and inhale daily.   TRIAMCINOLONE CREAM (KENALOG) 0.5 %    Apply topically.  Modified Medications   Modified Medication Previous Medication   OXYCODONE (OXY IR/ROXICODONE) 5 MG IMMEDIATE RELEASE TABLET oxyCODONE (OXY IR/ROXICODONE) 5 MG immediate release tablet      Take 1 tablet (5 mg total) by mouth 3 (three) times daily.    Take 1 tablet (  5 mg total) by mouth 3 (three) times daily.  Discontinued Medications   No medications on file   ----------------------------------------------------------------------------------------------------------------------  Follow-up: Return in about 2 months (around 06/25/2017) for evaluation, procedure.    Molli Barrows, MD

## 2017-04-27 ENCOUNTER — Ambulatory Visit
Admission: RE | Admit: 2017-04-27 | Discharge: 2017-04-27 | Disposition: A | Discharge status: Home / Self Care | Payer: MEDICARE

## 2017-04-27 DIAGNOSIS — R188 Other ascites: Secondary | ICD-10-CM

## 2017-04-27 DIAGNOSIS — K7460 Unspecified cirrhosis of liver: Principal | ICD-10-CM

## 2017-04-27 LAB — COMPREHENSIVE METABOLIC PANEL
ALBUMIN: 3.8 g/dL (ref 3.5–5.0)
ALKALINE PHOSPHATASE: 64 U/L (ref 38–126)
ANION GAP: 14 mmol/L (ref 9–15)
AST (SGOT): 40 U/L — ABNORMAL HIGH (ref 14–38)
BILIRUBIN TOTAL: 0.8 mg/dL (ref 0.0–1.2)
BLOOD UREA NITROGEN: 33 mg/dL — ABNORMAL HIGH (ref 7–21)
BUN / CREAT RATIO: 21
CALCIUM: 9.3 mg/dL (ref 8.5–10.2)
CHLORIDE: 101 mmol/L (ref 98–107)
CO2: 26 mmol/L (ref 22.0–30.0)
CREATININE: 1.55 mg/dL — ABNORMAL HIGH (ref 0.60–1.00)
EGFR MDRD AF AMER: 40 mL/min/{1.73_m2} — ABNORMAL LOW (ref >=60)
EGFR MDRD NON AF AMER: 33 mL/min/{1.73_m2} — ABNORMAL LOW (ref >=60)
GLUCOSE RANDOM: 128 mg/dL (ref 65–179)
SODIUM: 141 mmol/L (ref 135–145)

## 2017-04-27 LAB — MEAN CORPUSCULAR HEMOGLOBIN CONC: Lab: 30.1 — ABNORMAL LOW

## 2017-04-27 LAB — CBC
HEMATOCRIT: 27.1 % — ABNORMAL LOW (ref 36.0–46.0)
MEAN CORPUSCULAR HEMOGLOBIN CONC: 30.1 g/dL — ABNORMAL LOW (ref 31.0–37.0)
MEAN CORPUSCULAR HEMOGLOBIN: 25.2 pg — ABNORMAL LOW (ref 26.0–34.0)
MEAN CORPUSCULAR VOLUME: 83.5 fL (ref 80.0–100.0)
MEAN PLATELET VOLUME: 8.3 fL (ref 7.0–10.0)
PLATELET COUNT: 133 10*9/L — ABNORMAL LOW (ref 150–440)
RED BLOOD CELL COUNT: 3.24 10*12/L — ABNORMAL LOW (ref 4.00–5.20)
WBC ADJUSTED: 3.1 10*9/L — ABNORMAL LOW (ref 4.5–11.0)

## 2017-04-27 LAB — PROTIME: Lab: 12.9 — ABNORMAL HIGH

## 2017-04-27 LAB — PROTIME-INR: INR: 1.13

## 2017-04-27 LAB — EGFR MDRD NON AF AMER
Glomerular filtration rate/1.73 sq M.predicted.non black:ArVRat:Pt:Ser/Plas/Bld:Qn:Creatinine-based formula (MDRD): 33 — ABNORMAL LOW

## 2017-04-27 NOTE — Unmapped (Signed)
Patient came to clinic for a LVP. Albumin 75g infusion given. Post procedure VS stable.Patient Discharged via wheelchair in stable condition.

## 2017-04-27 NOTE — Unmapped (Signed)
Paracentesis      Indications:   Increased abdominal girth      Procedure:  The risks and benefits of the procedure were explained and informed consent was obtained.  Refer to the consent documentation.  The head of the bed was placed 30-45 degrees above level and the meniscus of the ascites was evaluated by percussion with maneuvers.   Anatomic landmarks were used to mark the intended site of paracentesis in theleft lower quadrant.  Ultrasound was used to confirm the site.     Local anesthesia with 1 percent lidocaine was introduced subcutaneously then deep to the skin until the parietal peritoneum was anesthetized. A small incision was made with a scalpel. A paracentesis needle with a catheter was introduced into this site until ascitic fluid was encountered and the catheter was introduced over the needle.  Ascitic fluid and the catheter were removed with minimal bleeding.  A sterile bandage was placed after holding pressure.  The patient tolerated the procedure well and remains in the same condition as pre-procedure.    Albumin 5%/25% 75 grams was administered at the time of paracentesis.       Complications:  none     Disposition of specimen(s)    Fluid Type:   Ascitic Fluid   Volume:    7.5 Liters   Character:   clear and yellow   Sent for:   Not sent

## 2017-05-11 ENCOUNTER — Ambulatory Visit
Admission: RE | Admit: 2017-05-11 | Discharge: 2017-05-11 | Disposition: A | Discharge status: Home / Self Care | Payer: MEDICARE

## 2017-05-11 DIAGNOSIS — Z114 Encounter for screening for human immunodeficiency virus [HIV]: Secondary | ICD-10-CM

## 2017-05-11 DIAGNOSIS — R0602 Shortness of breath: Secondary | ICD-10-CM

## 2017-05-11 DIAGNOSIS — K7460 Unspecified cirrhosis of liver: Principal | ICD-10-CM

## 2017-05-11 DIAGNOSIS — R188 Other ascites: Secondary | ICD-10-CM

## 2017-05-11 DIAGNOSIS — B182 Chronic viral hepatitis C: Secondary | ICD-10-CM

## 2017-05-11 DIAGNOSIS — Z1159 Encounter for screening for other viral diseases: Secondary | ICD-10-CM

## 2017-05-11 LAB — HIV ANTIGEN/ANTIBODY COMBO: HIV 1+2 Ab+HIV1 p24 Ag:PrThr:Pt:Ser/Plas:Ord:IA: NONREACTIVE

## 2017-05-11 LAB — COMPREHENSIVE METABOLIC PANEL
ALBUMIN: 3.5 g/dL (ref 3.5–5.0)
ALKALINE PHOSPHATASE: 61 U/L (ref 38–126)
ALT (SGPT): 24 U/L (ref 15–48)
ANION GAP: 11 mmol/L (ref 9–15)
AST (SGOT): 41 U/L — ABNORMAL HIGH (ref 14–38)
BILIRUBIN TOTAL: 0.7 mg/dL (ref 0.0–1.2)
BLOOD UREA NITROGEN: 41 mg/dL — ABNORMAL HIGH (ref 7–21)
CALCIUM: 8.9 mg/dL (ref 8.5–10.2)
CHLORIDE: 99 mmol/L (ref 98–107)
CO2: 27 mmol/L (ref 22.0–30.0)
CREATININE: 1.62 mg/dL — ABNORMAL HIGH (ref 0.60–1.00)
EGFR MDRD AF AMER: 38 mL/min/{1.73_m2} — ABNORMAL LOW (ref >=60)
EGFR MDRD NON AF AMER: 32 mL/min/{1.73_m2} — ABNORMAL LOW (ref >=60)
GLUCOSE RANDOM: 165 mg/dL (ref 65–179)
POTASSIUM: 4.5 mmol/L (ref 3.5–5.0)
SODIUM: 137 mmol/L (ref 135–145)

## 2017-05-11 LAB — CBC W/ AUTO DIFF
BASOPHILS ABSOLUTE COUNT: 0 10*9/L (ref 0.0–0.1)
HEMATOCRIT: 26.5 % — ABNORMAL LOW (ref 36.0–46.0)
LARGE UNSTAINED CELLS: 1 % (ref 0–4)
LYMPHOCYTES ABSOLUTE COUNT: 0.3 10*9/L — ABNORMAL LOW (ref 1.5–5.0)
MEAN CORPUSCULAR HEMOGLOBIN CONC: 30.5 g/dL — ABNORMAL LOW (ref 31.0–37.0)
MEAN CORPUSCULAR HEMOGLOBIN: 25.5 pg — ABNORMAL LOW (ref 26.0–34.0)
MEAN CORPUSCULAR VOLUME: 83.6 fL (ref 80.0–100.0)
MEAN PLATELET VOLUME: 9.1 fL (ref 7.0–10.0)
MONOCYTES ABSOLUTE COUNT: 0.1 10*9/L — ABNORMAL LOW (ref 0.2–0.8)
NEUTROPHILS ABSOLUTE COUNT: 2.2 10*9/L (ref 2.0–7.5)
PLATELET COUNT: 82 10*9/L — ABNORMAL LOW (ref 150–440)
RED BLOOD CELL COUNT: 3.17 10*12/L — ABNORMAL LOW (ref 4.00–5.20)
RED CELL DISTRIBUTION WIDTH: 17.1 % — ABNORMAL HIGH (ref 12.0–15.0)
WBC ADJUSTED: 3.1 10*9/L — ABNORMAL LOW (ref 4.5–11.0)

## 2017-05-11 LAB — PROTIME-INR: INR: 1.06

## 2017-05-11 LAB — HEPATITIS C ANTIBODY: Hepatitis C virus Ab:PrThr:Pt:Ser:Ord:: REACTIVE — AB

## 2017-05-11 LAB — PROTIME: Lab: 12.1

## 2017-05-11 LAB — HEPATITIS B SURFACE ANTIGEN: Hepatitis B virus surface Ag:PrThr:Pt:Ser:Ord:: NONREACTIVE

## 2017-05-11 LAB — SMEAR REVIEW

## 2017-05-11 LAB — MEAN CORPUSCULAR HEMOGLOBIN CONC: Lab: 30.5 — ABNORMAL LOW

## 2017-05-11 LAB — BLOOD UREA NITROGEN: Urea nitrogen:MCnc:Pt:Ser/Plas:Qn:: 41 — ABNORMAL HIGH

## 2017-05-11 NOTE — Unmapped (Signed)
Paracentesis      Indications:   Increased abdominal girth      Procedure:  The risks and benefits of the procedure were explained and informed consent was obtained.  Refer to the consent documentation.  The head of the bed was placed 30-45 degrees above level and the meniscus of the ascites was evaluated by percussion with maneuvers.   Anatomic landmarks were used to mark the intended site of paracentesis in theleft lower quadrant.  Ultrasound was used to confirm the site.     Local anesthesia with 1 percent lidocaine was introduced subcutaneously then deep to the skin until the parietal peritoneum was anesthetized. A small incision was made with a scalpel. A paracentesis needle with a catheter was introduced into this site until ascitic fluid was encountered and the catheter was introduced over the needle.  Ascitic fluid and the catheter were removed with minimal bleeding.  A sterile bandage was placed after holding pressure.  The patient tolerated the procedure well and remains in the same condition as pre-procedure.    Albumin 5%/25% 75 grams was administered at the time of paracentesis.       Complications:  none     Disposition of specimen(s)    Fluid Type:   Ascitic Fluid   Volume:   7 Liters   Character:   clear and yellow   Sent for:   Not sent

## 2017-05-11 NOTE — Unmapped (Signed)
Pt here today for Paracentesis performed by Vallery Sa NP.  22 gauge IV cath inserted in left antecubital vein x 1 attempt with blood drawn and sent to lab.  Albumin 25% 75 gm infused at 300 ml/hr on pump beginning at 1254 and completed at 1355.   VS repeated after procedure and normal.  IV cath removed intact and bleeding stopped with gauze pressure dressing applied to site.  Ambulated to restroom to void. Pt tolerated procedure well with no complaints,  discharged from clinic and taken to chest ray via w/c. See MAR and flow sheets.

## 2017-05-12 ENCOUNTER — Other Ambulatory Visit: Payer: Self-pay | Admitting: Anesthesiology

## 2017-05-13 ENCOUNTER — Ambulatory Visit
Admission: RE | Admit: 2017-05-13 | Discharge: 2017-05-13 | Disposition: A | Discharge status: Home / Self Care | Payer: MEDICARE | Attending: Internal Medicine | Admitting: Internal Medicine

## 2017-05-13 ENCOUNTER — Ambulatory Visit
Admission: RE | Admit: 2017-05-13 | Discharge: 2017-05-13 | Disposition: A | Discharge status: Home / Self Care | Payer: MEDICARE | Attending: Student in an Organized Health Care Education/Training Program | Admitting: Student in an Organized Health Care Education/Training Program

## 2017-05-13 DIAGNOSIS — I8510 Secondary esophageal varices without bleeding: Principal | ICD-10-CM

## 2017-05-13 LAB — HEPATITIS C RNA, QUANTITATIVE, PCR: HCV RNA: NOT DETECTED

## 2017-05-13 LAB — HCV RNA COMMENT: Lab: 0

## 2017-05-19 MED FILL — XIFAXAN/550MG/TABS: XIFAXAN/550MG/TABS | 30 days supply | Qty: 60 | Fill #4

## 2017-05-25 ENCOUNTER — Ambulatory Visit
Admission: RE | Admit: 2017-05-25 | Discharge: 2017-05-25 | Disposition: A | Discharge status: Home / Self Care | Payer: MEDICARE

## 2017-05-25 DIAGNOSIS — K7460 Unspecified cirrhosis of liver: Principal | ICD-10-CM

## 2017-05-25 DIAGNOSIS — R188 Other ascites: Secondary | ICD-10-CM

## 2017-05-25 LAB — CBC W/ AUTO DIFF
BASOPHILS ABSOLUTE COUNT: 0 10*9/L (ref 0.0–0.1)
EOSINOPHILS ABSOLUTE COUNT: 0.3 10*9/L (ref 0.0–0.4)
HEMATOCRIT: 27.8 % — ABNORMAL LOW (ref 36.0–46.0)
HEMOGLOBIN: 8.7 g/dL — ABNORMAL LOW (ref 12.0–16.0)
LARGE UNSTAINED CELLS: 1 % (ref 0–4)
LYMPHOCYTES ABSOLUTE COUNT: 0.3 10*9/L — ABNORMAL LOW (ref 1.5–5.0)
MEAN CORPUSCULAR HEMOGLOBIN CONC: 31.1 g/dL (ref 31.0–37.0)
MEAN CORPUSCULAR HEMOGLOBIN: 26.3 pg (ref 26.0–34.0)
MEAN CORPUSCULAR VOLUME: 84.6 fL (ref 80.0–100.0)
MEAN PLATELET VOLUME: 8.7 fL (ref 7.0–10.0)
NEUTROPHILS ABSOLUTE COUNT: 2.5 10*9/L (ref 2.0–7.5)
PLATELET COUNT: 107 10*9/L — ABNORMAL LOW (ref 150–440)
RED CELL DISTRIBUTION WIDTH: 16.8 % — ABNORMAL HIGH (ref 12.0–15.0)
WBC ADJUSTED: 3.4 10*9/L — ABNORMAL LOW (ref 4.5–11.0)

## 2017-05-25 LAB — COMPREHENSIVE METABOLIC PANEL
ALBUMIN: 3.4 g/dL — ABNORMAL LOW (ref 3.5–5.0)
ALKALINE PHOSPHATASE: 79 U/L (ref 38–126)
ANION GAP: 10 mmol/L (ref 9–15)
BLOOD UREA NITROGEN: 33 mg/dL — ABNORMAL HIGH (ref 7–21)
BUN / CREAT RATIO: 19
CALCIUM: 8.9 mg/dL (ref 8.5–10.2)
CHLORIDE: 99 mmol/L (ref 98–107)
CO2: 29 mmol/L (ref 22.0–30.0)
CREATININE: 1.71 mg/dL — ABNORMAL HIGH (ref 0.60–1.00)
EGFR MDRD AF AMER: 36 mL/min/{1.73_m2} — ABNORMAL LOW (ref >=60)
EGFR MDRD NON AF AMER: 30 mL/min/{1.73_m2} — ABNORMAL LOW (ref >=60)
GLUCOSE RANDOM: 172 mg/dL (ref 65–179)
POTASSIUM: 4.2 mmol/L (ref 3.5–5.0)
PROTEIN TOTAL: 7.5 g/dL (ref 6.5–8.3)
SODIUM: 138 mmol/L (ref 135–145)

## 2017-05-25 LAB — CHLORIDE: Chloride:SCnc:Pt:Ser/Plas:Qn:: 99

## 2017-05-25 LAB — SMEAR REVIEW

## 2017-05-25 LAB — GAMMA GLUTAMYL TRANSFERASE: Gamma glutamyl transferase:CCnc:Pt:Ser/Plas:Qn:: 31

## 2017-05-25 LAB — INR: Lab: 1.1

## 2017-05-25 LAB — LARGE UNSTAINED CELLS: Lab: 1

## 2017-05-25 NOTE — Unmapped (Signed)
Paracentesis      Indications:   Increased abdominal girth      Procedure:  The risks and benefits of the procedure were explained and informed consent was obtained.  Refer to the consent documentation.  The head of the bed was placed 30-45 degrees above level and the meniscus of the ascites was evaluated by percussion with maneuvers.   Anatomic landmarks were used to mark the intended site of paracentesis in theleft lower quadrant.  Ultrasound was used to confirm the site.     Local anesthesia with 1 percent lidocaine was introduced subcutaneously then deep to the skin until the parietal peritoneum was anesthetized. A small incision was made with a scalpel. A paracentesis needle with a catheter was introduced into this site until ascitic fluid was encountered and the catheter was introduced over the needle.  Ascitic fluid and the catheter were removed with minimal bleeding.  A sterile bandage was placed after holding pressure.  The patient tolerated the procedure well and remains in the same condition as pre-procedure.    Albumin 5%/25% 75 grams was administered at the time of paracentesis.       Complications:  none     Disposition of specimen(s)    Fluid Type:   Ascitic Fluid   Volume:   6.3 Liters   Character:   clear and yellow   Sent for:   Not sent

## 2017-05-25 NOTE — Unmapped (Signed)
Patient here for a LVP. Patient triaged. VS stable. PIV placed. Labs drawn and albumin infused without difficulty. No signs or symptoms of any reaction to albumin. Post procedure VS stable. Patient observed 20 minutes post procedure. PIV dc'd, pressure held for a few minutes and gauze with tape dressing placed over insertion site. Patient discharged from clinic to front of hospital via wheelchair where her son picked her up.

## 2017-06-08 ENCOUNTER — Ambulatory Visit
Admission: RE | Admit: 2017-06-08 | Discharge: 2017-06-08 | Disposition: A | Discharge status: Home / Self Care | Payer: MEDICARE

## 2017-06-08 DIAGNOSIS — K7460 Unspecified cirrhosis of liver: Principal | ICD-10-CM

## 2017-06-08 DIAGNOSIS — R188 Other ascites: Secondary | ICD-10-CM

## 2017-06-08 LAB — COMPREHENSIVE METABOLIC PANEL
ALKALINE PHOSPHATASE: 67 U/L (ref 38–126)
ALT (SGPT): 24 U/L (ref 15–48)
ANION GAP: 10 mmol/L (ref 9–15)
BILIRUBIN TOTAL: 0.7 mg/dL (ref 0.0–1.2)
BLOOD UREA NITROGEN: 36 mg/dL — ABNORMAL HIGH (ref 7–21)
BUN / CREAT RATIO: 20
CALCIUM: 9 mg/dL (ref 8.5–10.2)
CHLORIDE: 101 mmol/L (ref 98–107)
CO2: 28 mmol/L (ref 22.0–30.0)
CREATININE: 1.77 mg/dL — ABNORMAL HIGH (ref 0.60–1.00)
EGFR MDRD AF AMER: 35 mL/min/{1.73_m2} — ABNORMAL LOW (ref >=60)
EGFR MDRD NON AF AMER: 29 mL/min/{1.73_m2} — ABNORMAL LOW (ref >=60)
GLUCOSE RANDOM: 153 mg/dL (ref 65–179)
POTASSIUM: 4.5 mmol/L (ref 3.5–5.0)
PROTEIN TOTAL: 7.5 g/dL (ref 6.5–8.3)
SODIUM: 139 mmol/L (ref 135–145)

## 2017-06-08 LAB — CBC W/ AUTO DIFF
BASOPHILS ABSOLUTE COUNT: 0 10*9/L (ref 0.0–0.1)
EOSINOPHILS ABSOLUTE COUNT: 0.4 10*9/L (ref 0.0–0.4)
HEMATOCRIT: 27.1 % — ABNORMAL LOW (ref 36.0–46.0)
HEMOGLOBIN: 8.2 g/dL — ABNORMAL LOW (ref 12.0–16.0)
LARGE UNSTAINED CELLS: 1 % (ref 0–4)
LYMPHOCYTES ABSOLUTE COUNT: 0.3 10*9/L — ABNORMAL LOW (ref 1.5–5.0)
MEAN CORPUSCULAR HEMOGLOBIN CONC: 30.3 g/dL — ABNORMAL LOW (ref 31.0–37.0)
MEAN CORPUSCULAR HEMOGLOBIN: 25 pg — ABNORMAL LOW (ref 26.0–34.0)
MEAN PLATELET VOLUME: 8.5 fL (ref 7.0–10.0)
MONOCYTES ABSOLUTE COUNT: 0.2 10*9/L (ref 0.2–0.8)
NEUTROPHILS ABSOLUTE COUNT: 2.6 10*9/L (ref 2.0–7.5)
PLATELET COUNT: 119 10*9/L — ABNORMAL LOW (ref 150–440)
RED CELL DISTRIBUTION WIDTH: 16.9 % — ABNORMAL HIGH (ref 12.0–15.0)
WBC ADJUSTED: 3.5 10*9/L — ABNORMAL LOW (ref 4.5–11.0)

## 2017-06-08 LAB — SMEAR REVIEW

## 2017-06-08 LAB — MEAN CORPUSCULAR VOLUME: Lab: 82.7

## 2017-06-08 LAB — CALCIUM: Calcium:MCnc:Pt:Ser/Plas:Qn:: 9

## 2017-06-08 LAB — PROTIME: Lab: 12.7

## 2017-06-08 NOTE — Unmapped (Signed)
Paracentesis      Indications:   Increased abdominal girth      Procedure:  The risks and benefits of the procedure were explained and informed consent was obtained.  Refer to the consent documentation.  The head of the bed was placed 30-45 degrees above level and the meniscus of the ascites was evaluated by percussion with maneuvers.   Anatomic landmarks were used to mark the intended site of paracentesis in theleft lower quadrant.  Ultrasound was used to confirm the site.     Local anesthesia with 1 percent lidocaine was introduced subcutaneously then deep to the skin until the parietal peritoneum was anesthetized. A small incision was made with a scalpel. A paracentesis needle with a catheter was introduced into this site until ascitic fluid was encountered and the catheter was introduced over the needle.  Ascitic fluid and the catheter were removed with minimal bleeding.  A sterile bandage was placed after holding pressure.  The patient tolerated the procedure well and remains in the same condition as pre-procedure.    Albumin 5%/25% 75 grams was administered at the time of paracentesis.       Complications:  none     Disposition of specimen(s)    Fluid Type:   Ascitic Fluid   Volume:   8 Liters   Character:   clear and yellow   Sent for:   Not sent

## 2017-06-08 NOTE — Unmapped (Signed)
Patient here for her routine LVP. Triaged. Vitals stable. Patient on home oxygen of 2 liters via nasal cannula. Taken to procedure room and hooked to our wall oxygen. Number 22 PIV placed in left AC without difficulty. Patient only allows PIVs to be placed in left AC. Blood work obtained from PIV. Albumin given throughout the LVP procedure. See MAR. Patient has approximately 8 liters withdrawn from abdomen. Tolerated procedure well. VS stable post procedure. PIV flushed after albumin infusion complete and removed. Pressure held over PIV site for 4 minutes. Gauze and tape dressing placed over site. Patient assisted into wheelchair and discharged accompanied by her friend Eunice Blase.

## 2017-06-13 NOTE — Unmapped (Signed)
Specialty Pharmacy Refill Coordination Note     Kim Hull. Apple is a 71 y.o. female contacted today regarding refills of her specialty medication(s).    Reviewed and verified with patient:     [address]  [place] [address]    Specialty medication(s) and dose(s) confirmed: yes  Changes to medications: no  Changes to insurance: no    Medication Adherence    Patient reported X missed doses in the last month:  0  Specialty Medication:  XIFAXAN         Refill Coordination    Has the Patients' Contact Information Changed:  No  Is the Shipping Address Different:  No       Shipping Information    Delivery Scheduled:  Yes  Delivery Date:  06/21/17  Medications to be Shipped:  Burman Blacksmith          Follow-up: 3 week(s)     Dorothea Ogle  Specialty Pharmacy Technician

## 2017-06-20 ENCOUNTER — Ambulatory Visit: Payer: Medicare Other | Attending: Anesthesiology | Admitting: Anesthesiology

## 2017-06-20 ENCOUNTER — Encounter: Payer: Self-pay | Admitting: Anesthesiology

## 2017-06-20 VITALS — BP 154/85 | HR 78 | Temp 98.0°F | Resp 16 | Ht 66.0 in | Wt 266.0 lb

## 2017-06-20 DIAGNOSIS — M5136 Other intervertebral disc degeneration, lumbar region: Secondary | ICD-10-CM | POA: Diagnosis not present

## 2017-06-20 DIAGNOSIS — Z79899 Other long term (current) drug therapy: Secondary | ICD-10-CM | POA: Diagnosis not present

## 2017-06-20 DIAGNOSIS — M545 Low back pain, unspecified: Secondary | ICD-10-CM

## 2017-06-20 DIAGNOSIS — M542 Cervicalgia: Secondary | ICD-10-CM

## 2017-06-20 DIAGNOSIS — G894 Chronic pain syndrome: Secondary | ICD-10-CM | POA: Insufficient documentation

## 2017-06-20 DIAGNOSIS — M4696 Unspecified inflammatory spondylopathy, lumbar region: Secondary | ICD-10-CM

## 2017-06-20 DIAGNOSIS — M5386 Other specified dorsopathies, lumbar region: Secondary | ICD-10-CM | POA: Insufficient documentation

## 2017-06-20 DIAGNOSIS — M47816 Spondylosis without myelopathy or radiculopathy, lumbar region: Secondary | ICD-10-CM

## 2017-06-20 DIAGNOSIS — Z794 Long term (current) use of insulin: Secondary | ICD-10-CM | POA: Insufficient documentation

## 2017-06-20 DIAGNOSIS — M4686 Other specified inflammatory spondylopathies, lumbar region: Secondary | ICD-10-CM | POA: Insufficient documentation

## 2017-06-20 DIAGNOSIS — M25511 Pain in right shoulder: Secondary | ICD-10-CM | POA: Diagnosis not present

## 2017-06-20 MED ORDER — OXYCODONE HCL 5 MG PO TABS: 5.0000 mg | ORAL_TABLET | Freq: Three times a day (TID) | ORAL | 0 refills | Status: DC

## 2017-06-20 NOTE — Patient Instructions (Signed)
Prescription given to patient oxycodone x2.

## 2017-06-20 NOTE — Progress Notes (Signed)
Nursing Pain Medication Assessment:  Safety precautions to be maintained throughout the outpatient stay will include: orient to surroundings, keep bed in low position, maintain call bell within reach at all times, provide assistance with transfer out of bed and ambulation.  Medication Inspection Compliance: Pill count conducted under aseptic conditions, in front of the patient. Neither the pills nor the bottle was removed from the patient's sight at any time. Once count was completed pills were immediately returned to the patient in their original bottle.  Medication: See above Pill/Patch Count: 67 of 75 pills remain Pill/Patch Appearance: Markings consistent with prescribed medication Bottle Appearance: Standard pharmacy container. Clearly labeled. Filled Date: 38 / 34 / 2018 Last Medication intake:  Today

## 2017-06-20 NOTE — Progress Notes (Signed)
Subjective:  Patient ID: Whitney Cline, female    DOB: 30-Dec-1945  Age: 71 y.o. MRN: [id number]  CC: Shoulder Pain (right) and Back Pain (lower)   Procedure: None  HPI Whitney Cline presents for reevaluation.  She was last seen 2 months ago and has been doing well.  The quality characteristic and distribution of her pain is otherwise been stable.  No changes in lower extremity strength or function are noted.  Her shoulder pain is been at baseline.  She continues to have diffuse body pain of the similar nature without significant changes noted.  Upper and lower extremity strength have been at baseline and she is tolerating her medications well.  Based on her narcotic assessment sheet she continues to derive good functional lifestyle improvement with her medications.  Outpatient Medications Prior to Visit  Medication Sig Dispense Refill  . ACCU-CHEK AVIVA PLUS test strip USE QID UTD  2  . albuterol (PROVENTIL HFA;VENTOLIN HFA) 108 (90 Base) MCG/ACT inhaler Inhale 2 puffs into the lungs every 6 (six) hours as needed for wheezing or shortness of breath.    Marland Kitchen albuterol (PROVENTIL) (2.5 MG/3ML) 0.083% nebulizer solution VVN Q 6 H PRF WHZ  12  . Blood Glucose Monitoring Suppl (FIFTY50 GLUCOSE METER 2.0) w/Device KIT Use as directed. E11.9    . budesonide-formoterol (SYMBICORT) 160-4.5 MCG/ACT inhaler Inhale 2 puffs into the lungs 2 (two) times daily.    . carisoprodol (SOMA) 350 MG tablet Take 1 tablet (350 mg total) by mouth 4 (four) times daily. 30 tablet 5  . carvedilol (COREG) 3.125 MG tablet Take 3.125 mg by mouth 2 (two) times daily with a meal.    . cetirizine (ZYRTEC) 10 MG tablet Take 10 mg by mouth daily.    . cholecalciferol (VITAMIN D) 400 units TABS tablet Take 400 Units by mouth.    . cyclobenzaprine (FLEXERIL) 5 MG tablet TAKE 1 TABLET(5 MG) BY MOUTH THREE TIMES DAILY AS NEEDED FOR MUSCLE SPASMS 75 tablet 0  . furosemide (LASIX) 20 MG tablet Take 20 mg by mouth 2 (two)  times daily.     Marland Kitchen gabapentin (NEURONTIN) 300 MG capsule Take 300 mg by mouth daily at 12 noon.    Marland Kitchen glucose blood test strip USE FOUR TIMES DAILY AS INSTRUCTED    . hydrOXYzine (ATARAX/VISTARIL) 25 MG tablet Take by mouth.    . Insulin Pen Needle (PEN NEEDLES) 32G X 4 MM MISC Use for injections humalog TID times daily,  then glargine daily accucheck viva machine    . lactulose, encephalopathy, (GENERLAC) 10 GM/15ML SOLN Take 30 g by mouth daily as needed.    . Lancets (ACCU-CHEK MULTICLIX) lancets USE ONCE D AS INSTRUCTED  6  . lidocaine (XYLOCAINE) 5 % ointment Apply topically.    . liraglutide (VICTOZA) 18 MG/3ML SOPN 1.8 mg.    . metFORMIN (GLUCOPHAGE) 500 MG tablet Take 1,000 mg by mouth 2 (two) times daily with a meal.    . methocarbamol (ROBAXIN) 750 MG tablet TAKE 1 TABLET(750 MG) BY MOUTH TWICE DAILY 60 tablet 0  . naloxone (NARCAN) 0.4 MG/ML injection Inject 0.4 mg into the vein as needed.    . Naloxone HCl 0.4 MG/0.4ML SOAJ Inject 1 Units as directed once daily as needed.    . rifaximin (XIFAXAN) 550 MG TABS tablet Take 550 mg by mouth 2 (two) times daily.    Marland Kitchen tiotropium (SPIRIVA) 18 MCG inhalation capsule Place 18 mcg into inhaler and inhale daily.    Marland Kitchen  oxyCODONE (OXY IR/ROXICODONE) 5 MG immediate release tablet Take 1 tablet (5 mg total) by mouth 3 (three) times daily. 75 tablet 0  . azelastine (ASTELIN) 0.1 % nasal spray Place into the nose.    . cephALEXin (KEFLEX) 250 MG capsule Take by mouth 4 (four) times daily.    . ferrous sulfate 325 (65 FE) MG EC tablet Take by mouth.     Facility-Administered Medications Prior to Visit  Medication Dose Route Frequency Provider Last Rate Last Dose  . dexamethasone (DECADRON) injection 10 mg  10 mg Other Once Molli Barrows, MD      . dexamethasone (DECADRON) injection 4 mg  4 mg Other Once Molli Barrows, MD      . ropivacaine (PF) 2 mg/ml (0.2%) (NAROPIN) epidural 1 mL  1 mL Epidural Once Molli Barrows, MD      . ropivacaine (PF) 2  mg/mL (0.2%) (NAROPIN) injection 10 mL  10 mL Epidural Once Molli Barrows, MD        Review of Systems CNS: No confusion or sedation Cardiac: No palpitations or angina GI: No constipation or abdominal pain  Objective:  BP (!) 154/85   Pulse 78   Temp 98 F (36.7 C)   Resp 16   Ht '5\' 6"'$  (1.676 m)   Wt 266 lb (120.7 kg)   SpO2 100%   BMI 42.93 kg/m    BP Readings from Last 3 Encounters:  06/20/17 (!) 154/85  04/25/17 (!) 151/68  03/01/17 (!) 155/72     Wt Readings from Last 3 Encounters:  06/20/17 266 lb (120.7 kg)  04/25/17 280 lb (127 kg)  03/01/17 280 lb (127 kg)     Physical Exam Pt is alert and oriented PERRL EOMI HEART IS RRR no murmur or rub LCTA no wheezing or rhales MUSCULOSKELETAL reveals some persistent paraspinous muscle tenderness but no overt trigger points.  She is ambulating with an antalgic gait and uses a walker for assistance with her muscle tone and bulk are at baseline  Labs  Lab Results  Component Value Date   HGBA1C 9.4 (H) 10/29/2012   Lab Results  Component Value Date   CREATININE 1.70 (H) 08/19/2016    -------------------------------------------------------------------------------------------------------------------- Lab Results  Component Value Date   WBC 13.7 (H) 08/19/2016   HGB 6.5 (L) 08/19/2016   HCT 20.4 (L) 08/19/2016   PLT 246 08/19/2016   GLUCOSE 265 (H) 08/19/2016   ALT 19 08/19/2016   AST 47 (H) 08/19/2016   NA 133 (L) 08/19/2016   K 5.5 (H) 08/19/2016   CL 98 (L) 08/19/2016   CREATININE 1.70 (H) 08/19/2016   BUN 97 (H) 08/19/2016   CO2 22 08/19/2016   INR 1.1 10/28/2012   HGBA1C 9.4 (H) 10/29/2012    --------------------------------------------------------------------------------------------------------------------- Dg Chest 2 View  Result Date: 08/19/2016 CLINICAL DATA:  Dyspnea and wheezing x2 days wound EXAM: CHEST  2 VIEW COMPARISON:  Chest CT 08/12/2007, CXR 07/28/2013 0 FINDINGS: Unchanged right  lower lobe well-circumscribed 3.3 cm mass dating back to 2009 consistent with a benign finding. Mild interstitial prominence noted bilaterally which may reflect bronchitic change. No alveolar consolidation, effusion or pneumothorax. Heart is top-normal in size. There is aortic atherosclerosis without aneurysm. IMPRESSION: Stable right lower lobe 3.3 cm mass dating back to 2009 consistent with a benign finding. Mild interstitial prominence bilaterally which may reflect bronchitic change. No pulmonary consolidation. Aortic atherosclerosis. Electronically Signed   By: Ashley Royalty M.D.   On: 08/19/2016 14:20  Assessment & Plan:   Whitney Cline was seen today for shoulder pain and back pain.  Diagnoses and all orders for this visit:  Chronic pain syndrome  Low back pain at multiple sites  Cervicalgia  Pain in joint of right shoulder  DDD (degenerative disc disease), lumbar  Low back derangement syndrome  Facet arthritis of lumbar region ([place])  Other orders -     Discontinue: oxyCODONE (OXY IR/ROXICODONE) 5 MG immediate release tablet; Take 1 tablet (5 mg total) 3 (three) times daily by mouth. -     oxyCODONE (OXY IR/ROXICODONE) 5 MG immediate release tablet; Take 1 tablet (5 mg total) 3 (three) times daily by mouth.        ----------------------------------------------------------------------------------------------------------------------  Problem List Items Addressed This Visit    None    Visit Diagnoses    Chronic pain syndrome    -  Primary   Low back pain at multiple sites       Relevant Medications   oxyCODONE (OXY IR/ROXICODONE) 5 MG immediate release tablet   Cervicalgia       Pain in joint of right shoulder       DDD (degenerative disc disease), lumbar       Relevant Medications   oxyCODONE (OXY IR/ROXICODONE) 5 MG immediate release tablet   Low back derangement syndrome       Relevant Medications   oxyCODONE (OXY IR/ROXICODONE) 5 MG immediate release tablet   Facet  arthritis of lumbar region ([place])       Relevant Medications   oxyCODONE (OXY IR/ROXICODONE) 5 MG immediate release tablet        ----------------------------------------------------------------------------------------------------------------------  1. Chronic pain syndrome We will refill her medications for December 12 and January 11 of 2019.  She is to return to clinic in 2 months.  She seems to be doing well with her current regimen and no changes will be made at this time.  We have reviewed the [facility] practitioner database information and it is appropriate  2. Low back pain at multiple sites   3. Cervicalgia   4. Pain in joint of right shoulder   5. DDD (degenerative disc disease), lumbar   6. Low back derangement syndrome   7. Facet arthritis of lumbar region [facility])     ----------------------------------------------------------------------------------------------------------------------  I am having Whitney Cline. Mateus maintain her albuterol, budesonide-formoterol, carvedilol, cetirizine, cholecalciferol, furosemide, gabapentin, metFORMIN, naloxone, tiotropium, glucose blood, albuterol, azelastine, FIFTY50 GLUCOSE METER 2.0, ACCU-CHEK AVIVA PLUS, hydrOXYzine, lidocaine, Pen Needles, accu-chek multiclix, ferrous sulfate, Naloxone HCl, liraglutide, methocarbamol, carisoprodol, lactulose (encephalopathy), rifaximin, cephALEXin, cyclobenzaprine, and oxyCODONE. We will continue to administer dexamethasone, ropivacaine (PF) 2 mg/mL (0.2%), dexamethasone, and ropivacaine (PF) 2 mg/mL (0.2%).   Meds ordered this encounter  Medications  . DISCONTD: oxyCODONE (OXY IR/ROXICODONE) 5 MG immediate release tablet    Sig: Take 1 tablet (5 mg total) 3 (three) times daily by mouth.    Dispense:  75 tablet    Refill:  0    Do not fill until [id number]  . oxyCODONE (OXY IR/ROXICODONE) 5 MG immediate release tablet    Sig: Take 1 tablet (5 mg total) 3 (three) times daily by mouth.     Dispense:  75 tablet    Refill:  0    Do not fill until [id number]     Medication List        Accurate as of 06/20/17  3:19 PM. Always use your most recent med list.  accu-chek multiclix lancets   * albuterol 108 (90 Base) MCG/ACT inhaler Commonly known as:  PROVENTIL HFA;VENTOLIN HFA   * albuterol (2.5 MG/3ML) 0.083% nebulizer solution Commonly known as:  PROVENTIL   azelastine 0.1 % nasal spray Commonly known as:  ASTELIN   budesonide-formoterol 160-4.5 MCG/ACT inhaler Commonly known as:  SYMBICORT   carisoprodol 350 MG tablet Commonly known as:  SOMA Take 1 tablet (350 mg total) by mouth 4 (four) times daily.   carvedilol 3.125 MG tablet Commonly known as:  COREG   cephALEXin 250 MG capsule Commonly known as:  KEFLEX   cetirizine 10 MG tablet Commonly known as:  ZYRTEC   cholecalciferol 400 units Tabs tablet Commonly known as:  VITAMIN D   cyclobenzaprine 5 MG tablet Commonly known as:  FLEXERIL TAKE 1 TABLET(5 MG) BY MOUTH THREE TIMES DAILY AS NEEDED FOR MUSCLE SPASMS   ferrous sulfate 325 (65 FE) MG EC tablet   FIFTY50 GLUCOSE METER 2.0 w/Device Kit   furosemide 20 MG tablet Commonly known as:  LASIX   gabapentin 300 MG capsule Commonly known as:  NEURONTIN   GENERLAC 10 GM/15ML Soln Generic drug:  lactulose (encephalopathy)   * glucose blood test strip   * ACCU-CHEK AVIVA PLUS test strip Generic drug:  glucose blood   hydrOXYzine 25 MG tablet Commonly known as:  ATARAX/VISTARIL   lidocaine 5 % ointment Commonly known as:  XYLOCAINE   metFORMIN 500 MG tablet Commonly known as:  GLUCOPHAGE   methocarbamol 750 MG tablet Commonly known as:  ROBAXIN TAKE 1 TABLET(750 MG) BY MOUTH TWICE DAILY   * naloxone 0.4 MG/ML injection Commonly known as:  NARCAN   * Naloxone HCl 0.4 MG/0.4ML Soaj   oxyCODONE 5 MG immediate release tablet Commonly known as:  Oxy IR/ROXICODONE Take 1 tablet (5 mg total) 3 (three) times daily by mouth.     Pen Needles 32G X 4 MM Misc   rifaximin 550 MG Tabs tablet Commonly known as:  XIFAXAN   tiotropium 18 MCG inhalation capsule Commonly known as:  SPIRIVA   VICTOZA 18 MG/3ML Sopn Generic drug:  liraglutide      * This list has 6 medication(s) that are the same as other medications prescribed for you. Read the directions carefully, and ask your doctor or other care provider to review them with you.          Where to Get Your Medications    You can get these medications from any pharmacy   Bring a paper prescription for each of these medications  oxyCODONE 5 MG immediate release tablet    ----------------------------------------------------------------------------------------------------------------------  Follow-up: Return in about 2 months (around 08/20/2017) for evaluation, med refill.    Molli Barrows, MD

## 2017-06-21 ENCOUNTER — Encounter: Payer: Self-pay | Admitting: Anesthesiology

## 2017-06-21 MED ORDER — RIFAXIMIN 550 MG TABLET
ORAL_TABLET | Freq: Two times a day (BID) | ORAL | 4 refills | 0 days | Status: CP
Start: 2017-06-21 — End: 2017-08-16

## 2017-06-21 MED ORDER — XIFAXAN 550 MG TABLET
ORAL_TABLET | ORAL | 13 refills | 0 days
Start: 2017-06-21 — End: 2017-06-21

## 2017-06-21 MED FILL — XIFAXAN/550MG/TABS: XIFAXAN/550MG/TABS | 30 days supply | Qty: 60 | Fill #0

## 2017-06-22 ENCOUNTER — Ambulatory Visit
Admission: RE | Admit: 2017-06-22 | Discharge: 2017-06-22 | Disposition: A | Discharge status: Home / Self Care | Payer: MEDICARE

## 2017-06-22 DIAGNOSIS — K7460 Unspecified cirrhosis of liver: Principal | ICD-10-CM

## 2017-06-22 DIAGNOSIS — M25511 Pain in right shoulder: Secondary | ICD-10-CM

## 2017-06-22 DIAGNOSIS — R188 Other ascites: Secondary | ICD-10-CM

## 2017-06-22 LAB — CBC W/ AUTO DIFF
BASOPHILS ABSOLUTE COUNT: 0 10*9/L (ref 0.0–0.1)
EOSINOPHILS ABSOLUTE COUNT: 0.5 10*9/L — ABNORMAL HIGH (ref 0.0–0.4)
HEMATOCRIT: 27.7 % — ABNORMAL LOW (ref 36.0–46.0)
LARGE UNSTAINED CELLS: 1 % (ref 0–4)
LYMPHOCYTES ABSOLUTE COUNT: 0.3 10*9/L — ABNORMAL LOW (ref 1.5–5.0)
MEAN CORPUSCULAR HEMOGLOBIN CONC: 31.4 g/dL (ref 31.0–37.0)
MEAN CORPUSCULAR HEMOGLOBIN: 26.1 pg (ref 26.0–34.0)
MEAN PLATELET VOLUME: 8.6 fL (ref 7.0–10.0)
MONOCYTES ABSOLUTE COUNT: 0.2 10*9/L (ref 0.2–0.8)
PLATELET COUNT: 101 10*9/L — ABNORMAL LOW (ref 150–440)
RED CELL DISTRIBUTION WIDTH: 16.7 % — ABNORMAL HIGH (ref 12.0–15.0)
WBC ADJUSTED: 4.2 10*9/L — ABNORMAL LOW (ref 4.5–11.0)

## 2017-06-22 LAB — COMPREHENSIVE METABOLIC PANEL
ALBUMIN: 3.6 g/dL (ref 3.5–5.0)
ALKALINE PHOSPHATASE: 66 U/L (ref 38–126)
ALT (SGPT): 24 U/L (ref 15–48)
ANION GAP: 10 mmol/L (ref 9–15)
AST (SGOT): 34 U/L (ref 14–38)
BLOOD UREA NITROGEN: 45 mg/dL — ABNORMAL HIGH (ref 7–21)
BUN / CREAT RATIO: 24
CHLORIDE: 100 mmol/L (ref 98–107)
CO2: 29 mmol/L (ref 22.0–30.0)
CREATININE: 1.86 mg/dL — ABNORMAL HIGH (ref 0.60–1.00)
EGFR MDRD AF AMER: 33 mL/min/{1.73_m2} — ABNORMAL LOW (ref >=60)
EGFR MDRD NON AF AMER: 27 mL/min/{1.73_m2} — ABNORMAL LOW (ref >=60)
GLUCOSE RANDOM: 147 mg/dL (ref 65–179)
PROTEIN TOTAL: 8 g/dL (ref 6.5–8.3)
SODIUM: 139 mmol/L (ref 135–145)

## 2017-06-22 LAB — SMEAR REVIEW

## 2017-06-22 LAB — AST (SGOT): Aspartate aminotransferase:CCnc:Pt:Ser/Plas:Qn:: 34

## 2017-06-22 LAB — ANISOCYTOSIS

## 2017-06-22 MED ORDER — DICLOFENAC 1 % TOPICAL GEL
Freq: Four times a day (QID) | TOPICAL | 1 refills | 0.00000 days | Status: CP
Start: 2017-06-22 — End: 2018-10-11

## 2017-06-22 NOTE — Unmapped (Signed)
Paracentesis      Indications:   Increased abdominal girth      Procedure:  The risks and benefits of the procedure were explained and informed consent was obtained.  Refer to the consent documentation.  The head of the bed was placed 30-45 degrees above level and the meniscus of the ascites was evaluated by percussion with maneuvers.   Anatomic landmarks were used to mark the intended site of paracentesis in theleft lower quadrant.  Ultrasound was used to confirm the site.     Local anesthesia with 1 percent lidocaine was introduced subcutaneously then deep to the skin until the parietal peritoneum was anesthetized. A small incision was made with a scalpel. A paracentesis needle with a catheter was introduced into this site until ascitic fluid was encountered and the catheter was introduced over the needle.  Ascitic fluid and the catheter were removed with minimal bleeding.  A sterile bandage was placed after holding pressure.  The patient tolerated the procedure well and remains in the same condition as pre-procedure.    Albumin 5%/25% 75 grams was administered at the time of paracentesis.       Complications:  none     Disposition of specimen(s)    Fluid Type:   Ascitic Fluid   Volume:   8.50 Liters   Character:   clear and yellow   Sent for:   Not sent

## 2017-06-22 NOTE — Unmapped (Signed)
Patient here for an LVP. Triaged. VS stable. Patient settled onto stretcher and PIV placed. Labs sent. Consent for procedure obtained and witnessed. Approximately 9 liters removed from LVP. IV Albumin given and tolerated without any issues. Post VS consistent with pre-procedure vitals signs. PIV removed. Pressure held to PIV insertion site for 3 minutes and gauze with tape dressing applied. Patient assisted to bathroom and then to wheelchair and taken to main hospital lobby where patient's son picked her up.

## 2017-07-06 ENCOUNTER — Ambulatory Visit
Admission: RE | Admit: 2017-07-06 | Discharge: 2017-07-06 | Disposition: A | Discharge status: Home / Self Care | Payer: MEDICARE

## 2017-07-06 DIAGNOSIS — L03113 Cellulitis of right upper limb: Secondary | ICD-10-CM

## 2017-07-06 DIAGNOSIS — R188 Other ascites: Secondary | ICD-10-CM

## 2017-07-06 DIAGNOSIS — K7460 Unspecified cirrhosis of liver: Principal | ICD-10-CM

## 2017-07-06 LAB — COMPREHENSIVE METABOLIC PANEL
ALBUMIN: 3.6 g/dL (ref 3.5–5.0)
ALKALINE PHOSPHATASE: 77 U/L (ref 38–126)
ALT (SGPT): 27 U/L (ref 15–48)
ANION GAP: 11 mmol/L (ref 9–15)
AST (SGOT): 41 U/L — ABNORMAL HIGH (ref 14–38)
BILIRUBIN TOTAL: 0.8 mg/dL (ref 0.0–1.2)
BLOOD UREA NITROGEN: 39 mg/dL — ABNORMAL HIGH (ref 7–21)
BUN / CREAT RATIO: 21
CALCIUM: 9.2 mg/dL (ref 8.5–10.2)
CHLORIDE: 100 mmol/L (ref 98–107)
CO2: 30 mmol/L (ref 22.0–30.0)
EGFR MDRD AF AMER: 32 mL/min/{1.73_m2} — ABNORMAL LOW (ref >=60)
EGFR MDRD NON AF AMER: 27 mL/min/{1.73_m2} — ABNORMAL LOW (ref >=60)
GLUCOSE RANDOM: 200 mg/dL — ABNORMAL HIGH (ref 65–179)
SODIUM: 141 mmol/L (ref 135–145)

## 2017-07-06 LAB — CBC
HEMATOCRIT: 27.4 % — ABNORMAL LOW (ref 36.0–46.0)
HEMOGLOBIN: 8.6 g/dL — ABNORMAL LOW (ref 12.0–16.0)
MEAN CORPUSCULAR HEMOGLOBIN: 26.4 pg (ref 26.0–34.0)
MEAN CORPUSCULAR VOLUME: 84.4 fL (ref 80.0–100.0)
MEAN PLATELET VOLUME: 8.8 fL (ref 7.0–10.0)
PLATELET COUNT: 101 10*9/L — ABNORMAL LOW (ref 150–440)
RED BLOOD CELL COUNT: 3.24 10*12/L — ABNORMAL LOW (ref 4.00–5.20)
WBC ADJUSTED: 3.5 10*9/L — ABNORMAL LOW (ref 4.5–11.0)

## 2017-07-06 LAB — ALBUMIN: Albumin:MCnc:Pt:Ser/Plas:Qn:: 3.6

## 2017-07-06 LAB — RED BLOOD CELL COUNT: Lab: 3.24 — ABNORMAL LOW

## 2017-07-06 MED ORDER — CIPROFLOXACIN 500 MG TABLET
ORAL_TABLET | Freq: Two times a day (BID) | ORAL | 0 refills | 0 days | Status: CP
Start: 2017-07-06 — End: 2017-11-07

## 2017-07-06 NOTE — Unmapped (Signed)
Paracentesis      Indications:   Increased abdominal girth      Procedure:  The risks and benefits of the procedure were explained and informed consent was obtained.  Refer to the consent documentation.  The head of the bed was placed 30-45 degrees above level and the meniscus of the ascites was evaluated by percussion with maneuvers.   Anatomic landmarks were used to mark the intended site of paracentesis in theleft lower quadrant.  Ultrasound was used to confirm the site.     Local anesthesia with 1 percent lidocaine was introduced subcutaneously then deep to the skin until the parietal peritoneum was anesthetized. A small incision was made with a scalpel. A paracentesis needle with a catheter was introduced into this site until ascitic fluid was encountered and the catheter was introduced over the needle.  Ascitic fluid and the catheter were removed with minimal bleeding.  A sterile bandage was placed after holding pressure.  The patient tolerated the procedure well and remains in the same condition as pre-procedure.    Albumin 5%/25% 75 grams was administered at the time of paracentesis.       Complications:  none     Disposition of specimen(s)    Fluid Type:   Ascitic Fluid   Volume:   6.1 Liters   Character:   clear and yellow   Sent for:   Not sent

## 2017-07-06 NOTE — Unmapped (Signed)
Pt here today for Paracentesis performed by Vallery Sa NP.  22 gauge IV cath inserted in left antecubital vein x 1 attempt with blood drawn and sent to lab.  Albumin 25% 75 gm infused at 300 ml/hr on pump beginning at 1252 and completed at 1358.   VS repeated after procedure and normal.  IV cath removed intact and bleeding stopped with gauze pressure dressing applied to site.  Ambulated to restroom to void.  Pt tolerated procedure well with no complaints and discharged to home.  See MAR and flow sheets.

## 2017-07-20 ENCOUNTER — Ambulatory Visit
Admission: RE | Admit: 2017-07-20 | Discharge: 2017-07-20 | Disposition: A | Discharge status: Home / Self Care | Payer: MEDICARE

## 2017-07-20 DIAGNOSIS — K7460 Unspecified cirrhosis of liver: Principal | ICD-10-CM

## 2017-07-20 DIAGNOSIS — B372 Candidiasis of skin and nail: Secondary | ICD-10-CM

## 2017-07-20 LAB — CBC
HEMATOCRIT: 27.2 % — ABNORMAL LOW (ref 36.0–46.0)
HEMOGLOBIN: 8.3 g/dL — ABNORMAL LOW (ref 12.0–16.0)
MEAN CORPUSCULAR HEMOGLOBIN CONC: 30.6 g/dL — ABNORMAL LOW (ref 31.0–37.0)
MEAN CORPUSCULAR HEMOGLOBIN: 25.9 pg — ABNORMAL LOW (ref 26.0–34.0)
MEAN CORPUSCULAR VOLUME: 84.8 fL (ref 80.0–100.0)
RED BLOOD CELL COUNT: 3.2 10*12/L — ABNORMAL LOW (ref 4.00–5.20)
WBC ADJUSTED: 3.5 10*9/L — ABNORMAL LOW (ref 4.5–11.0)

## 2017-07-20 LAB — COMPREHENSIVE METABOLIC PANEL
ALBUMIN: 4 g/dL (ref 3.5–5.0)
ANION GAP: 14 mmol/L (ref 9–15)
AST (SGOT): 40 U/L — ABNORMAL HIGH (ref 14–38)
BILIRUBIN TOTAL: 0.8 mg/dL (ref 0.0–1.2)
BLOOD UREA NITROGEN: 44 mg/dL — ABNORMAL HIGH (ref 7–21)
BUN / CREAT RATIO: 21
CALCIUM: 9.4 mg/dL (ref 8.5–10.2)
CHLORIDE: 103 mmol/L (ref 98–107)
CO2: 27 mmol/L (ref 22.0–30.0)
CREATININE: 2.06 mg/dL — ABNORMAL HIGH (ref 0.60–1.00)
EGFR MDRD NON AF AMER: 24 mL/min/{1.73_m2} — ABNORMAL LOW (ref >=60)
GLUCOSE RANDOM: 133 mg/dL (ref 65–179)
POTASSIUM: 4.3 mmol/L (ref 3.5–5.0)
PROTEIN TOTAL: 7.7 g/dL (ref 6.5–8.3)
SODIUM: 144 mmol/L (ref 135–145)

## 2017-07-20 LAB — HEMATOCRIT: Lab: 27.2 — ABNORMAL LOW

## 2017-07-20 LAB — BUN / CREAT RATIO: Urea nitrogen/Creatinine:MRto:Pt:Ser/Plas:Qn:: 21

## 2017-07-20 MED ORDER — NYSTATIN 100,000 UNIT/GRAM TOPICAL POWDER
Freq: Four times a day (QID) | TOPICAL | 0 refills | 0 days | Status: CP
Start: 2017-07-20 — End: 2017-11-07

## 2017-07-20 NOTE — Unmapped (Signed)
New Medications: nystatin powder to red areas under breast, up to four times a day    Decrease these Medications: Decrease Lasix to 20mg  per day.       Please follow a 2000mg  sodium restricted diet      It is OK to take up to 2000mg  of acetaminophen (tylenol), please be cautious of combination products. Do not take ibuprofen, naproxen, aspirin, advil, aleve, motrin, Excedrin or headache powders.       Please Return to clinic in 2 weeks  If you have not received a notice about a follow-up appointment please call the [facility] at [phone]

## 2017-07-20 NOTE — Unmapped (Signed)
Patient here for an LVP. Pre and post procedure vitals stable. Chronic back pain consistent. Back pain goes away when patient lies down. Consent obtained by Vallery Sa and witnessed by me. PIV placed and labs drawn from it. Albumin given during LVP procedure. No signs or symptoms of a reaction. Tolerated procedure well. PIV dc'd and pressure dressing applied over PIV insertion site. Patient taken down to cancer center entrance via wheelchair where patient's son, Arlys John picked her up. Patient's son's phone number is [phone].

## 2017-07-20 NOTE — Unmapped (Signed)
Paracentesis      Indications:   Increased abdominal girth      Procedure:  The risks and benefits of the procedure were explained and informed consent was obtained.  Refer to the consent documentation.  The head of the bed was placed 30-45 degrees above level and the meniscus of the ascites was evaluated by percussion with maneuvers.   Anatomic landmarks were used to mark the intended site of paracentesis in theleft lower quadrant.  Ultrasound was used to confirm the site.     Local anesthesia with 1 percent lidocaine was introduced subcutaneously then deep to the skin until the parietal peritoneum was anesthetized. A small incision was made with a scalpel. A paracentesis needle with a catheter was introduced into this site until ascitic fluid was encountered and the catheter was introduced over the needle.  Ascitic fluid and the catheter were removed with minimal bleeding.  A sterile bandage was placed after holding pressure.  The patient tolerated the procedure well and remains in the same condition as pre-procedure.    Albumin 5%/25% 75 grams was administered at the time of paracentesis.       Complications:  none     Disposition of specimen(s)    Fluid Type:   Ascitic Fluid   Volume:    7.5 Liters   Character:   clear and yellow   Sent for:   Not sent

## 2017-07-21 NOTE — Unmapped (Signed)
Late entry to document PIV removal.

## 2017-07-27 MED FILL — XIFAXAN/550MG/TABS: XIFAXAN/550MG/TABS | 30 days supply | Qty: 60 | Fill #1

## 2017-07-27 NOTE — Unmapped (Signed)
[facility] Refill Coordination Note  Specialty Medication(s): XIFAXAN  [address] [place], [place]: 29-Jun-1946  Phone: [phone] (home) , Alternate phone contact: N/A  Phone or address changes today?: No  All above HIPAA information was verified with patient.  Shipping Address: [address]  [place] [address]   Insurance changes? No    Completed refill call assessment today to schedule patient's medication shipment from the [facility] [phone]).      Confirmed the medication and dosage are correct and have not changed: Yes, regimen is correct and unchanged.    Confirmed patient started or stopped the following medications in the past month:  No, there are no changes reported at this time.    Are you tolerating your medication?:  Aixa reports tolerating the medication.    ADHERENCE        Did you miss any doses in the past 4 weeks? No missed doses reported.    FINANCIAL/SHIPPING    Delivery Scheduled: Yes, Expected medication delivery date: 07/29/17     Senaida did not have any additional questions at this time.    Delivery address validated in [facility] scheduling system: Yes, address listed in [facility] is correct.    We will follow up with patient monthly for standard refill processing and delivery.      Thank you,  Westley Gambles   [facility] Specialty Technician

## 2017-08-03 ENCOUNTER — Institutional Professional Consult (permissible substitution): Admit: 2017-08-03 | Discharge: 2017-08-04 | Payer: MEDICARE

## 2017-08-03 DIAGNOSIS — K7460 Unspecified cirrhosis of liver: Principal | ICD-10-CM

## 2017-08-03 DIAGNOSIS — B373 Candidiasis of vulva and vagina: Secondary | ICD-10-CM

## 2017-08-03 DIAGNOSIS — R188 Other ascites: Secondary | ICD-10-CM

## 2017-08-03 LAB — COMPREHENSIVE METABOLIC PANEL
ALBUMIN: 3.8 g/dL (ref 3.5–5.0)
ALKALINE PHOSPHATASE: 74 U/L (ref 38–126)
ALT (SGPT): 20 U/L (ref 15–48)
ANION GAP: 11 mmol/L (ref 9–15)
AST (SGOT): 33 U/L (ref 14–38)
BILIRUBIN TOTAL: 1 mg/dL (ref 0.0–1.2)
BLOOD UREA NITROGEN: 41 mg/dL — ABNORMAL HIGH (ref 7–21)
BUN / CREAT RATIO: 20
CALCIUM: 9.2 mg/dL (ref 8.5–10.2)
CHLORIDE: 103 mmol/L (ref 98–107)
CO2: 26 mmol/L (ref 22.0–30.0)
CREATININE: 2.04 mg/dL — ABNORMAL HIGH (ref 0.60–1.00)
EGFR MDRD AF AMER: 29 mL/min/{1.73_m2} — ABNORMAL LOW (ref >=60)
POTASSIUM: 5.1 mmol/L — ABNORMAL HIGH (ref 3.5–5.0)
PROTEIN TOTAL: 7.7 g/dL (ref 6.5–8.3)
SODIUM: 140 mmol/L (ref 135–145)

## 2017-08-03 LAB — CBC
HEMATOCRIT: 27.9 % — ABNORMAL LOW (ref 36.0–46.0)
HEMOGLOBIN: 8.7 g/dL — ABNORMAL LOW (ref 12.0–16.0)
MEAN CORPUSCULAR HEMOGLOBIN CONC: 31 g/dL (ref 31.0–37.0)
MEAN CORPUSCULAR HEMOGLOBIN: 26.2 pg (ref 26.0–34.0)
MEAN CORPUSCULAR VOLUME: 84.6 fL (ref 80.0–100.0)
MEAN PLATELET VOLUME: 8.5 fL (ref 7.0–10.0)
RED BLOOD CELL COUNT: 3.3 10*12/L — ABNORMAL LOW (ref 4.00–5.20)
RED CELL DISTRIBUTION WIDTH: 15.5 % — ABNORMAL HIGH (ref 12.0–15.0)
WBC ADJUSTED: 3.6 10*9/L — ABNORMAL LOW (ref 4.5–11.0)

## 2017-08-03 LAB — RED BLOOD CELL COUNT: Lab: 3.3 — ABNORMAL LOW

## 2017-08-03 LAB — SODIUM: Sodium:SCnc:Pt:Ser/Plas:Qn:: 140

## 2017-08-03 NOTE — Unmapped (Signed)
Patient here for a LVP. Consent obtained. Vitals taken. Patient settled onto stretcher. Triage questions answered. PIV placed. Labs drawn from PIV. Albumin infused per orders. Patient tolerated procedure well. No signs or symptoms of any albumin reaction. PIV removed. Pressure held over insertion site. Pressure dressing placed over site. Patient sat on side of stretcher for 5 minutes. Repeat VS stable. Patient taken to bathroom and then d/c via wheelchair pushed by friend, Eunice Blase. Patient did not want to wait for patient transportation that was already paged.

## 2017-08-03 NOTE — Unmapped (Signed)
Paracentesis      Indications:   Increased abdominal girth      Procedure:  The risks and benefits of the procedure were explained and informed consent was obtained.  Refer to the consent documentation.  The head of the bed was placed 30-45 degrees above level and the meniscus of the ascites was evaluated by percussion with maneuvers.   Anatomic landmarks were used to mark the intended site of paracentesis in theleft lower quadrant.  Ultrasound was used to confirm the site.     Local anesthesia with 1 percent lidocaine was introduced subcutaneously then deep to the skin until the parietal peritoneum was anesthetized. A small incision was made with a scalpel. A paracentesis needle with a catheter was introduced into this site until ascitic fluid was encountered and the catheter was introduced over the needle.  Ascitic fluid and the catheter were removed with minimal bleeding.  A sterile bandage was placed after holding pressure.  The patient tolerated the procedure well and remains in the same condition as pre-procedure.    Albumin 5%/25% 75 grams was administered at the time of paracentesis.       Complications:  none     Disposition of specimen(s)    Fluid Type:   Ascitic Fluid   Volume:   8.5 Liters   Character:   clear and yellow   Sent for:   Not sent

## 2017-08-03 NOTE — Unmapped (Addendum)
Medication Changes:      For confusion:  Lactulose  Take enough  Lactulose to have 3 BMs per day OR  So your memory is better            YEAST:     Get OTC miconazole and mix with zinc-based cream (desitin) and apply to reddened areas.     The oral medication is not safe for you to take

## 2017-08-09 ENCOUNTER — Encounter: Payer: Self-pay | Admitting: Anesthesiology

## 2017-08-09 ENCOUNTER — Ambulatory Visit: Payer: Medicare HMO | Attending: Anesthesiology | Admitting: Anesthesiology

## 2017-08-09 ENCOUNTER — Other Ambulatory Visit: Payer: Self-pay

## 2017-08-09 VITALS — BP 138/66 | HR 72 | Temp 98.0°F | Resp 16 | Ht 67.0 in | Wt 278.0 lb

## 2017-08-09 DIAGNOSIS — G894 Chronic pain syndrome: Secondary | ICD-10-CM

## 2017-08-09 DIAGNOSIS — Z79899 Other long term (current) drug therapy: Secondary | ICD-10-CM | POA: Diagnosis not present

## 2017-08-09 DIAGNOSIS — M542 Cervicalgia: Secondary | ICD-10-CM

## 2017-08-09 DIAGNOSIS — Z7951 Long term (current) use of inhaled steroids: Secondary | ICD-10-CM | POA: Insufficient documentation

## 2017-08-09 DIAGNOSIS — M25511 Pain in right shoulder: Secondary | ICD-10-CM

## 2017-08-09 DIAGNOSIS — M5386 Other specified dorsopathies, lumbar region: Secondary | ICD-10-CM

## 2017-08-09 DIAGNOSIS — Z79891 Long term (current) use of opiate analgesic: Secondary | ICD-10-CM | POA: Diagnosis not present

## 2017-08-09 DIAGNOSIS — M5387 Other specified dorsopathies, lumbosacral region: Secondary | ICD-10-CM | POA: Diagnosis not present

## 2017-08-09 DIAGNOSIS — M75101 Unspecified rotator cuff tear or rupture of right shoulder, not specified as traumatic: Secondary | ICD-10-CM

## 2017-08-09 DIAGNOSIS — M545 Low back pain, unspecified: Secondary | ICD-10-CM

## 2017-08-09 DIAGNOSIS — M5136 Other intervertebral disc degeneration, lumbar region: Secondary | ICD-10-CM | POA: Diagnosis not present

## 2017-08-09 DIAGNOSIS — Z794 Long term (current) use of insulin: Secondary | ICD-10-CM | POA: Insufficient documentation

## 2017-08-09 DIAGNOSIS — F1190 Opioid use, unspecified, uncomplicated: Secondary | ICD-10-CM

## 2017-08-09 DIAGNOSIS — M51369 Other intervertebral disc degeneration, lumbar region without mention of lumbar back pain or lower extremity pain: Secondary | ICD-10-CM

## 2017-08-09 MED ORDER — ROPIVACAINE HCL 2 MG/ML IJ SOLN
INTRAMUSCULAR | Status: AC
  Filled 2017-08-09: qty 10

## 2017-08-09 MED ORDER — DEXAMETHASONE SODIUM PHOSPHATE 10 MG/ML IJ SOLN
INTRAMUSCULAR | Status: AC
  Filled 2017-08-09: qty 1

## 2017-08-09 MED ORDER — OXYCODONE HCL 5 MG PO TABS: 5.0000 mg | ORAL_TABLET | Freq: Three times a day (TID) | ORAL | 0 refills | Status: DC

## 2017-08-09 MED ORDER — DEXAMETHASONE SODIUM PHOSPHATE 4 MG/ML IJ SOLN
4.0000 mg | Freq: Once | INTRAMUSCULAR | Status: AC
  Administered 2017-08-09: 4 mg
  Filled 2017-08-09: qty 1

## 2017-08-09 MED ORDER — ROPIVACAINE HCL 2 MG/ML IJ SOLN
10.0000 mL | Freq: Once | INTRAMUSCULAR | Status: AC
  Administered 2017-08-09: 10 mL via EPIDURAL

## 2017-08-09 NOTE — Progress Notes (Signed)
Nursing Pain Medication Assessment:  Safety precautions to be maintained throughout the outpatient stay will include: orient to surroundings, keep bed in low position, maintain call bell within reach at all times, provide assistance with transfer out of bed and ambulation.  Medication Inspection Compliance: Pill count conducted under aseptic conditions, in front of the patient. Neither the pills nor the bottle was removed from the patient's sight at any time. Once count was completed pills were immediately returned to the patient in their original bottle.  Medication: See above Pill/Patch Count: 25 of 75 pills remain Pill/Patch Appearance: Markings consistent with prescribed medication Bottle Appearance: Standard pharmacy container. Clearly labeled. Filled Date: 93 / 55 / 2018 Last Medication intake:  Today

## 2017-08-10 ENCOUNTER — Telehealth: Payer: Self-pay | Admitting: *Deleted

## 2017-08-10 NOTE — Telephone Encounter (Signed)
No problems post procedure. 

## 2017-08-11 NOTE — Progress Notes (Signed)
Subjective:  Patient ID: Whitney Cline, female    DOB: [birth year]  Age: 72 y.o. MRN: [id number]  CC: Shoulder Pain (right)   Procedure: Right mid trapezius muscle trigger point and right anterior deltoid trigger point injection  HPI Whitney Cline presents for evaluation.  She was last seen a few months ago and continues to have some right-sided shoulder pain in addition to her chronic low back pain and diffuse body pain.  She has been on oxycodone 5 mg tablets taking these 2-3 tablets/day and these have worked for her quite recalcitrant diffuse body pain.  They have been well-tolerated and based on her narcotic assessment sheet she continues to derive good functional lifestyle improvement with them.  They have been well received.  No significant for side effects of been noted.  She is having some right shoulder pain that she reports today.  This is worse with rotational movements about the shoulder.  She is experiencing this in the right anterior shoulder and posterior upper shoulder region.  No weakness to the right upper extremity is noted she has some occasional neck pain but no limitations on motion.  She reports no fevers chills nausea or vomiting.  Outpatient Medications Prior to Visit  Medication Sig Dispense Refill  . ACCU-CHEK AVIVA PLUS test strip USE QID UTD  2  . albuterol (PROVENTIL HFA;VENTOLIN HFA) 108 (90 Base) MCG/ACT inhaler Inhale 2 puffs into the lungs every 6 (six) hours as needed for wheezing or shortness of breath.    Marland Kitchen albuterol (PROVENTIL) (2.5 MG/3ML) 0.083% nebulizer solution VVN Q 6 H PRF WHZ  12  . Blood Glucose Monitoring Suppl (FIFTY50 GLUCOSE METER 2.0) w/Device KIT Use as directed. E11.9    . budesonide-formoterol (SYMBICORT) 160-4.5 MCG/ACT inhaler Inhale 2 puffs into the lungs 2 (two) times daily.    . carisoprodol (SOMA) 350 MG tablet Take 1 tablet (350 mg total) by mouth 4 (four) times daily. 30 tablet 5  . carvedilol (COREG) 3.125 MG tablet Take  3.125 mg by mouth 2 (two) times daily with a meal.    . cetirizine (ZYRTEC) 10 MG tablet Take 10 mg by mouth daily.    . cholecalciferol (VITAMIN D) 400 units TABS tablet Take 400 Units by mouth.    . furosemide (LASIX) 20 MG tablet Take 10 mg by mouth daily.     Marland Kitchen gabapentin (NEURONTIN) 300 MG capsule Take 300 mg by mouth daily at 12 noon.    Marland Kitchen glucose blood test strip USE FOUR TIMES DAILY AS INSTRUCTED    . hydrOXYzine (ATARAX/VISTARIL) 25 MG tablet Take by mouth.    . Insulin Pen Needle (PEN NEEDLES) 32G X 4 MM MISC Use for injections humalog TID times daily,  then glargine daily accucheck viva machine    . lactulose, encephalopathy, (GENERLAC) 10 GM/15ML SOLN Take 30 g by mouth daily as needed.    . Lancets (ACCU-CHEK MULTICLIX) lancets USE ONCE D AS INSTRUCTED  6  . lidocaine (XYLOCAINE) 5 % ointment Apply topically.    . liraglutide (VICTOZA) 18 MG/3ML SOPN 1.8 mg.    . metFORMIN (GLUCOPHAGE) 500 MG tablet Take 1,000 mg by mouth 2 (two) times daily with a meal.    . methocarbamol (ROBAXIN) 750 MG tablet TAKE 1 TABLET(750 MG) BY MOUTH TWICE DAILY 60 tablet 0  . naloxone (NARCAN) 0.4 MG/ML injection Inject 0.4 mg into the vein as needed.    . Naloxone HCl 0.4 MG/0.4ML SOAJ Inject 1 Units as directed once daily  as needed.    . rifaximin (XIFAXAN) 550 MG TABS tablet Take 550 mg by mouth 2 (two) times daily.    Marland Kitchen tiotropium (SPIRIVA) 18 MCG inhalation capsule Place 18 mcg into inhaler and inhale daily.    Marland Kitchen oxyCODONE (OXY IR/ROXICODONE) 5 MG immediate release tablet Take 1 tablet (5 mg total) 3 (three) times daily by mouth. 75 tablet 0  . azelastine (ASTELIN) 0.1 % nasal spray Place into the nose.    . cephALEXin (KEFLEX) 250 MG capsule Take by mouth 4 (four) times daily.    . cyclobenzaprine (FLEXERIL) 5 MG tablet TAKE 1 TABLET(5 MG) BY MOUTH THREE TIMES DAILY AS NEEDED FOR MUSCLE SPASMS (Patient not taking: Reported on 08/09/2017) 75 tablet 0  . ferrous sulfate 325 (65 FE) MG EC tablet Take by  mouth.     Facility-Administered Medications Prior to Visit  Medication Dose Route Frequency Provider Last Rate Last Dose  . dexamethasone (DECADRON) injection 10 mg  10 mg Other Once Molli Barrows, MD      . dexamethasone (DECADRON) injection 4 mg  4 mg Other Once Molli Barrows, MD      . ropivacaine (PF) 2 mg/ml (0.2%) (NAROPIN) epidural 1 mL  1 mL Epidural Once Molli Barrows, MD      . ropivacaine (PF) 2 mg/mL (0.2%) (NAROPIN) injection 10 mL  10 mL Epidural Once Molli Barrows, MD        Review of Systems CNS: No confusion or sedation Cardiac: No angina or palpitations GI: No abdominal pain or constipation Constitutional no nausea vomiting fevers or chills  Objective:  BP 138/66   Pulse 72   Temp 98 F (36.7 C)   Resp 16   Ht '5\' 7"'$  (1.702 m)   Wt 278 lb (126.1 kg)   SpO2 99% Comment: 3l O2 pts own  BMI 43.54 kg/m    BP Readings from Last 3 Encounters:  08/09/17 138/66  06/20/17 (!) 154/85  04/25/17 (!) 151/68     Wt Readings from Last 3 Encounters:  08/09/17 278 lb (126.1 kg)  06/20/17 266 lb (120.7 kg)  04/25/17 280 lb (127 kg)     Physical Exam Pt is alert and oriented PERRL EOMI HEART IS RRR no murmur or rub LCTA no wheezing or rales MUSCULOSKELETAL 2 trigger points are noted one in the mid body posterior trapezius muscle and one in the anterior deltoid to the right of the acromioclavicular joint.  Her strength in the right upper extremity appears to be intact.  She does have pain with rotational motion about the glenohumeral joint.  Her muscle tone and bulk is good to the bilateral upper extremities.  Labs  Lab Results  Component Value Date   HGBA1C 9.4 (H) 10/29/2012   Lab Results  Component Value Date   CREATININE 1.70 (H) 08/19/2016    -------------------------------------------------------------------------------------------------------------------- Lab Results  Component Value Date   WBC 13.7 (H) 08/19/2016   HGB 6.5 (L) 08/19/2016    HCT 20.4 (L) 08/19/2016   PLT 246 08/19/2016   GLUCOSE 265 (H) 08/19/2016   ALT 19 08/19/2016   AST 47 (H) 08/19/2016   NA 133 (L) 08/19/2016   K 5.5 (H) 08/19/2016   CL 98 (L) 08/19/2016   CREATININE 1.70 (H) 08/19/2016   BUN 97 (H) 08/19/2016   CO2 22 08/19/2016   INR 1.1 10/28/2012   HGBA1C 9.4 (H) 10/29/2012    --------------------------------------------------------------------------------------------------------------------- Dg Chest 2 View  Result Date: 08/19/2016 CLINICAL DATA:  Dyspnea and wheezing x2 days wound EXAM: CHEST  2 VIEW COMPARISON:  Chest CT 08/12/2007, CXR 07/28/2013 0 FINDINGS: Unchanged right lower lobe well-circumscribed 3.3 cm mass dating back to 2009 consistent with a benign finding. Mild interstitial prominence noted bilaterally which may reflect bronchitic change. No alveolar consolidation, effusion or pneumothorax. Heart is top-normal in size. There is aortic atherosclerosis without aneurysm. IMPRESSION: Stable right lower lobe 3.3 cm mass dating back to 2009 consistent with a benign finding. Mild interstitial prominence bilaterally which may reflect bronchitic change. No pulmonary consolidation. Aortic atherosclerosis. Electronically Signed   By: Ashley Royalty M.D.   On: 08/19/2016 14:20     Assessment & Plan:   Raelene was seen today for shoulder pain.  Diagnoses and all orders for this visit:  Chronic pain syndrome  Low back pain at multiple sites  Cervicalgia -     INJECT TRIGGER POINT, 1 OR 2  Pain in joint of right shoulder -     INJECT TRIGGER POINT, 1 OR 2  DDD (degenerative disc disease), lumbar  Low back derangement syndrome  Chronic, continuous use of opioids -     ToxASSURE Select 13 (MW), Urine  Other orders -     Discontinue: oxyCODONE (OXY IR/ROXICODONE) 5 MG immediate release tablet; Take 1 tablet (5 mg total) by mouth 3 (three) times daily. -     oxyCODONE (OXY IR/ROXICODONE) 5 MG immediate release tablet; Take 1 tablet (5 mg  total) by mouth 3 (three) times daily. -     dexamethasone (DECADRON) injection 4 mg -     ropivacaine (PF) 2 mg/mL (0.2%) (NAROPIN) injection 10 mL        ----------------------------------------------------------------------------------------------------------------------  Problem List Items Addressed This Visit    None    Visit Diagnoses    Chronic pain syndrome    -  Primary   Low back pain at multiple sites       Relevant Medications   oxyCODONE (OXY IR/ROXICODONE) 5 MG immediate release tablet   dexamethasone (DECADRON) injection 4 mg (Completed)   Cervicalgia       Relevant Orders   INJECT TRIGGER POINT, 1 OR 2   Pain in joint of right shoulder       Relevant Orders   INJECT TRIGGER POINT, 1 OR 2   DDD (degenerative disc disease), lumbar       Relevant Medications   oxyCODONE (OXY IR/ROXICODONE) 5 MG immediate release tablet   dexamethasone (DECADRON) injection 4 mg (Completed)   Low back derangement syndrome       Relevant Medications   oxyCODONE (OXY IR/ROXICODONE) 5 MG immediate release tablet   dexamethasone (DECADRON) injection 4 mg (Completed)   Chronic, continuous use of opioids       Relevant Orders   ToxASSURE Select 13 (MW), Urine        ----------------------------------------------------------------------------------------------------------------------  1. Chronic pain syndrome We will refill her medications for February 10 - March 12.  She also has a urine drug screen that is pending.  We reviewed the [facility] practitioner database information and it is appropriate.  2. Low back pain at multiple sites   3. Cervicalgia Trigger point injections will be performed for her right trapezius muscle pain and right anterior shoulder pain gone over the risks and benefits of these procedures in detail and all questions were answered.  If this pain in the right posterior shoulder persist she may be a candidate for rotator cuff injection. - INJECT  TRIGGER POINT, 1 OR  2  4. Pain in joint of right shoulder As above - INJECT TRIGGER POINT, 1 OR 2  5. DDD (degenerative disc disease), lumbar   6. Low back derangement syndrome   7. Chronic, continuous use of opioids As above  8.  Right rotator cuff syndrome - ToxASSURE Select 13 (MW), Urine    ----------------------------------------------------------------------------------------------------------------------  I am having Whitney Cline. Whitney Cline maintain her albuterol, budesonide-formoterol, carvedilol, cetirizine, cholecalciferol, furosemide, gabapentin, metFORMIN, naloxone, tiotropium, glucose blood, albuterol, azelastine, FIFTY50 GLUCOSE METER 2.0, ACCU-CHEK AVIVA PLUS, hydrOXYzine, lidocaine, Pen Needles, accu-chek multiclix, ferrous sulfate, Naloxone HCl, liraglutide, methocarbamol, carisoprodol, lactulose (encephalopathy), rifaximin, cephALEXin, cyclobenzaprine, and oxyCODONE. We administered dexamethasone and ropivacaine (PF) 2 mg/mL (0.2%). We will continue to administer dexamethasone, ropivacaine (PF) 2 mg/mL (0.2%), dexamethasone, and ropivacaine (PF) 2 mg/mL (0.2%).   Meds ordered this encounter  Medications  . DISCONTD: oxyCODONE (OXY IR/ROXICODONE) 5 MG immediate release tablet    Sig: Take 1 tablet (5 mg total) by mouth 3 (three) times daily.    Dispense:  75 tablet    Refill:  0    Do not fill until [id number]  . oxyCODONE (OXY IR/ROXICODONE) 5 MG immediate release tablet    Sig: Take 1 tablet (5 mg total) by mouth 3 (three) times daily.    Dispense:  75 tablet    Refill:  0    Do not fill until [id number]  . dexamethasone (DECADRON) injection 4 mg  . ropivacaine (PF) 2 mg/mL (0.2%) (NAROPIN) injection 10 mL   Patient's Medications  New Prescriptions   No medications on file  Previous Medications   ACCU-CHEK AVIVA PLUS TEST STRIP    USE QID UTD   ALBUTEROL (PROVENTIL HFA;VENTOLIN HFA) 108 (90 BASE) MCG/ACT INHALER    Inhale 2 puffs into the lungs every 6 (six) hours  as needed for wheezing or shortness of breath.   ALBUTEROL (PROVENTIL) (2.5 MG/3ML) 0.083% NEBULIZER SOLUTION    VVN Q 6 H PRF WHZ   AZELASTINE (ASTELIN) 0.1 % NASAL SPRAY    Place into the nose.   BLOOD GLUCOSE MONITORING SUPPL (FIFTY50 GLUCOSE METER 2.0) W/DEVICE KIT    Use as directed. E11.9   BUDESONIDE-FORMOTEROL (SYMBICORT) 160-4.5 MCG/ACT INHALER    Inhale 2 puffs into the lungs 2 (two) times daily.   CARISOPRODOL (SOMA) 350 MG TABLET    Take 1 tablet (350 mg total) by mouth 4 (four) times daily.   CARVEDILOL (COREG) 3.125 MG TABLET    Take 3.125 mg by mouth 2 (two) times daily with a meal.   CEPHALEXIN (KEFLEX) 250 MG CAPSULE    Take by mouth 4 (four) times daily.   CETIRIZINE (ZYRTEC) 10 MG TABLET    Take 10 mg by mouth daily.   CHOLECALCIFEROL (VITAMIN D) 400 UNITS TABS TABLET    Take 400 Units by mouth.   CYCLOBENZAPRINE (FLEXERIL) 5 MG TABLET    TAKE 1 TABLET(5 MG) BY MOUTH THREE TIMES DAILY AS NEEDED FOR MUSCLE SPASMS   FERROUS SULFATE 325 (65 FE) MG EC TABLET    Take by mouth.   FUROSEMIDE (LASIX) 20 MG TABLET    Take 10 mg by mouth daily.    GABAPENTIN (NEURONTIN) 300 MG CAPSULE    Take 300 mg by mouth daily at 12 noon.   GLUCOSE BLOOD TEST STRIP    USE FOUR TIMES DAILY AS INSTRUCTED   HYDROXYZINE (ATARAX/VISTARIL) 25 MG TABLET    Take by mouth.   INSULIN PEN NEEDLE (PEN NEEDLES) 32G X 4 MM  MISC    Use for injections humalog TID times daily,  then glargine daily accucheck viva machine   LACTULOSE, ENCEPHALOPATHY, (GENERLAC) 10 GM/15ML SOLN    Take 30 g by mouth daily as needed.   LANCETS (ACCU-CHEK MULTICLIX) LANCETS    USE ONCE D AS INSTRUCTED   LIDOCAINE (XYLOCAINE) 5 % OINTMENT    Apply topically.   LIRAGLUTIDE (VICTOZA) 18 MG/3ML SOPN    1.8 mg.   METFORMIN (GLUCOPHAGE) 500 MG TABLET    Take 1,000 mg by mouth 2 (two) times daily with a meal.   METHOCARBAMOL (ROBAXIN) 750 MG TABLET    TAKE 1 TABLET(750 MG) BY MOUTH TWICE DAILY   NALOXONE (NARCAN) 0.4 MG/ML INJECTION    Inject  0.4 mg into the vein as needed.   NALOXONE HCL 0.4 MG/0.4ML SOAJ    Inject 1 Units as directed once daily as needed.   RIFAXIMIN (XIFAXAN) 550 MG TABS TABLET    Take 550 mg by mouth 2 (two) times daily.   TIOTROPIUM (SPIRIVA) 18 MCG INHALATION CAPSULE    Place 18 mcg into inhaler and inhale daily.  Modified Medications   Modified Medication Previous Medication   OXYCODONE (OXY IR/ROXICODONE) 5 MG IMMEDIATE RELEASE TABLET oxyCODONE (OXY IR/ROXICODONE) 5 MG immediate release tablet      Take 1 tablet (5 mg total) by mouth 3 (three) times daily.    Take 1 tablet (5 mg total) 3 (three) times daily by mouth.  Discontinued Medications   No medications on file   ----------------------------------------------------------------------------------------------------------------------  Follow-up: Return for evaluation, procedure.  Trigger point injection.  Trigger point injection: The area overlying the aforementioned trigger points were prepped with alcohol. They were then injected with a 25-gauge needle with 4 cc of ropivacaine 0.2% and Decadron 4 mg at each site after negative aspiration for heme. This was performed after informed consent was obtained and risks and benefits reviewed. She tolerated this procedure without difficulty and was convalesced and discharged to home in stable condition for follow-up as mentioned.  '@James'$  Andree Elk, MD@  Molli Barrows, MD

## 2017-08-12 LAB — TOXASSURE SELECT 13 (MW), URINE

## 2017-08-16 MED ORDER — LACTULOSE 10 GRAM/15 ML ORAL SOLUTION
Freq: Three times a day (TID) | ORAL | 3 refills | 0 days | Status: CP
Start: 2017-08-16 — End: 2018-10-04

## 2017-08-16 MED ORDER — RIFAXIMIN 550 MG TABLET
ORAL_TABLET | Freq: Two times a day (BID) | ORAL | 3 refills | 0.00000 days | Status: CP
Start: 2017-08-16 — End: 2018-07-13

## 2017-08-16 NOTE — Unmapped (Signed)
Refills re-sent to [facility] mail order pharmacy

## 2017-08-17 ENCOUNTER — Institutional Professional Consult (permissible substitution): Admit: 2017-08-17 | Discharge: 2017-08-18 | Payer: MEDICARE

## 2017-08-17 DIAGNOSIS — R188 Other ascites: Secondary | ICD-10-CM

## 2017-08-17 DIAGNOSIS — K7460 Unspecified cirrhosis of liver: Principal | ICD-10-CM

## 2017-08-17 LAB — COMPREHENSIVE METABOLIC PANEL
ALKALINE PHOSPHATASE: 64 U/L (ref 38–126)
ALT (SGPT): 25 U/L (ref 15–48)
ANION GAP: 8 mmol/L — ABNORMAL LOW (ref 9–15)
AST (SGOT): 28 U/L (ref 14–38)
BILIRUBIN TOTAL: 0.8 mg/dL (ref 0.0–1.2)
BLOOD UREA NITROGEN: 39 mg/dL — ABNORMAL HIGH (ref 7–21)
BUN / CREAT RATIO: 21
CALCIUM: 8.9 mg/dL (ref 8.5–10.2)
CHLORIDE: 107 mmol/L (ref 98–107)
CO2: 25 mmol/L (ref 22.0–30.0)
CREATININE: 1.86 mg/dL — ABNORMAL HIGH (ref 0.60–1.00)
EGFR MDRD AF AMER: 33 mL/min/{1.73_m2} — ABNORMAL LOW (ref >=60)
POTASSIUM: 4.6 mmol/L (ref 3.5–5.0)
PROTEIN TOTAL: 7 g/dL (ref 6.5–8.3)
SODIUM: 140 mmol/L (ref 135–145)

## 2017-08-17 LAB — CBC
HEMATOCRIT: 26.8 % — ABNORMAL LOW (ref 36.0–46.0)
HEMOGLOBIN: 8.4 g/dL — ABNORMAL LOW (ref 12.0–16.0)
MEAN CORPUSCULAR HEMOGLOBIN CONC: 31.3 g/dL (ref 31.0–37.0)
MEAN CORPUSCULAR HEMOGLOBIN: 26.2 pg (ref 26.0–34.0)
MEAN CORPUSCULAR VOLUME: 83.9 fL (ref 80.0–100.0)
MEAN PLATELET VOLUME: 8.2 fL (ref 7.0–10.0)
PLATELET COUNT: 104 10*9/L — ABNORMAL LOW (ref 150–440)
RED CELL DISTRIBUTION WIDTH: 15.9 % — ABNORMAL HIGH (ref 12.0–15.0)
WBC ADJUSTED: 3.2 10*9/L — ABNORMAL LOW (ref 4.5–11.0)

## 2017-08-17 LAB — HEMATOCRIT: Lab: 26.8 — ABNORMAL LOW

## 2017-08-17 LAB — BUN / CREAT RATIO: Urea nitrogen/Creatinine:MRto:Pt:Ser/Plas:Qn:: 21

## 2017-08-17 NOTE — Unmapped (Signed)
Paracentesis      Indications:   Increased abdominal girth      Procedure:  The risks and benefits of the procedure were explained and informed consent was obtained.  Refer to the consent documentation.  The head of the bed was placed 30-45 degrees above level and the meniscus of the ascites was evaluated by percussion with maneuvers.   Anatomic landmarks were used to mark the intended site of paracentesis in theleft lower quadrant.  Ultrasound was used to confirm the site.     Local anesthesia with 1 percent lidocaine was introduced subcutaneously then deep to the skin until the parietal peritoneum was anesthetized. A small incision was made with a scalpel. A paracentesis needle with a catheter was introduced into this site until ascitic fluid was encountered and the catheter was introduced over the needle.  Ascitic fluid and the catheter were removed with minimal bleeding.  A sterile bandage was placed after holding pressure.  The patient tolerated the procedure well and remains in the same condition as pre-procedure.    Albumin 5%/25% 75 grams was administered at the time of paracentesis.       Complications:  none     Disposition of specimen(s)    Fluid Type:   Ascitic Fluid   Volume:   9.2 Liters   Character:   clear and yellow   Sent for:   Not sent

## 2017-08-17 NOTE — Unmapped (Signed)
Patient here for an LVP. Triage questions answered. Pre procedure VS stable. Consent for procedure obtained and witnessed. PIV placed. Labs sent from PIV. Albumin infused without any difficulties. Patient tolerated procedure well. See Stefani Dama Metheny's note for volume removed. 75 grams of albumin infused. Post procedure VS stable. PIV removed. Pressure held over PIV site and pressure dressing applied. Patient accompanied to the bathroom and transportation called to escort patient via wheelchair [facility] to be picked up by son, Arlys John.

## 2017-08-23 NOTE — Unmapped (Signed)
Patient receiving meds from another pharmacy.

## 2017-08-31 ENCOUNTER — Institutional Professional Consult (permissible substitution): Admit: 2017-08-31 | Discharge: 2017-09-01 | Payer: MEDICARE

## 2017-08-31 DIAGNOSIS — E139 Other specified diabetes mellitus without complications: Secondary | ICD-10-CM

## 2017-08-31 DIAGNOSIS — K7460 Unspecified cirrhosis of liver: Principal | ICD-10-CM

## 2017-08-31 DIAGNOSIS — R188 Other ascites: Secondary | ICD-10-CM

## 2017-09-03 ENCOUNTER — Ambulatory Visit: Admit: 2017-09-03 | Discharge: 2017-09-04 | Payer: MEDICARE

## 2017-09-03 DIAGNOSIS — K7460 Unspecified cirrhosis of liver: Secondary | ICD-10-CM

## 2017-09-03 DIAGNOSIS — R188 Other ascites: Secondary | ICD-10-CM

## 2017-09-03 DIAGNOSIS — C220 Liver cell carcinoma: Principal | ICD-10-CM

## 2017-09-14 ENCOUNTER — Institutional Professional Consult (permissible substitution): Admit: 2017-09-14 | Discharge: 2017-09-15 | Payer: MEDICARE

## 2017-09-14 DIAGNOSIS — K7460 Unspecified cirrhosis of liver: Principal | ICD-10-CM

## 2017-09-14 DIAGNOSIS — R188 Other ascites: Secondary | ICD-10-CM

## 2017-09-28 ENCOUNTER — Institutional Professional Consult (permissible substitution): Admit: 2017-09-28 | Discharge: 2017-09-29 | Payer: MEDICARE

## 2017-09-28 DIAGNOSIS — R188 Other ascites: Secondary | ICD-10-CM

## 2017-09-28 DIAGNOSIS — K7460 Unspecified cirrhosis of liver: Principal | ICD-10-CM

## 2017-10-12 ENCOUNTER — Institutional Professional Consult (permissible substitution): Admit: 2017-10-12 | Discharge: 2017-10-13 | Payer: MEDICARE

## 2017-10-12 DIAGNOSIS — K7460 Unspecified cirrhosis of liver: Principal | ICD-10-CM

## 2017-10-20 ENCOUNTER — Telehealth: Payer: Self-pay | Admitting: *Deleted

## 2017-10-26 ENCOUNTER — Institutional Professional Consult (permissible substitution): Admit: 2017-10-26 | Discharge: 2017-10-27 | Payer: MEDICARE

## 2017-10-26 DIAGNOSIS — R188 Other ascites: Secondary | ICD-10-CM

## 2017-10-26 DIAGNOSIS — K7460 Unspecified cirrhosis of liver: Principal | ICD-10-CM

## 2017-10-26 DIAGNOSIS — N390 Urinary tract infection, site not specified: Secondary | ICD-10-CM

## 2017-10-26 MED ORDER — CIPROFLOXACIN 500 MG TABLET
ORAL_TABLET | Freq: Two times a day (BID) | ORAL | 0 refills | 0.00000 days | Status: CP
Start: 2017-10-26 — End: 2017-11-07

## 2017-11-01 ENCOUNTER — Encounter: Payer: Self-pay | Admitting: Anesthesiology

## 2017-11-01 ENCOUNTER — Ambulatory Visit: Payer: Medicare HMO | Attending: Anesthesiology | Admitting: Anesthesiology

## 2017-11-01 ENCOUNTER — Other Ambulatory Visit: Payer: Self-pay

## 2017-11-01 VITALS — BP 123/77 | HR 108 | Temp 98.7°F | Ht 66.5 in | Wt 272.0 lb

## 2017-11-01 DIAGNOSIS — M545 Low back pain, unspecified: Secondary | ICD-10-CM

## 2017-11-01 DIAGNOSIS — G894 Chronic pain syndrome: Secondary | ICD-10-CM | POA: Diagnosis not present

## 2017-11-01 DIAGNOSIS — M542 Cervicalgia: Secondary | ICD-10-CM

## 2017-11-01 DIAGNOSIS — Z794 Long term (current) use of insulin: Secondary | ICD-10-CM | POA: Diagnosis not present

## 2017-11-01 DIAGNOSIS — Z79899 Other long term (current) drug therapy: Secondary | ICD-10-CM | POA: Insufficient documentation

## 2017-11-01 DIAGNOSIS — Z79891 Long term (current) use of opiate analgesic: Secondary | ICD-10-CM | POA: Diagnosis not present

## 2017-11-01 DIAGNOSIS — M48062 Spinal stenosis, lumbar region with neurogenic claudication: Secondary | ICD-10-CM | POA: Insufficient documentation

## 2017-11-01 DIAGNOSIS — M25511 Pain in right shoulder: Secondary | ICD-10-CM | POA: Insufficient documentation

## 2017-11-01 DIAGNOSIS — F1190 Opioid use, unspecified, uncomplicated: Secondary | ICD-10-CM

## 2017-11-01 DIAGNOSIS — M5136 Other intervertebral disc degeneration, lumbar region: Secondary | ICD-10-CM | POA: Insufficient documentation

## 2017-11-01 DIAGNOSIS — M47816 Spondylosis without myelopathy or radiculopathy, lumbar region: Secondary | ICD-10-CM

## 2017-11-01 MED ORDER — OXYCODONE HCL 5 MG PO TABS: 5.0000 mg | ORAL_TABLET | Freq: Three times a day (TID) | ORAL | 0 refills | Status: DC

## 2017-11-01 NOTE — Progress Notes (Signed)
Nursing Pain Medication Assessment:  Safety precautions to be maintained throughout the outpatient stay will include: orient to surroundings, keep bed in low position, maintain call bell within reach at all times, provide assistance with transfer out of bed and ambulation.  Medication Inspection Compliance: Ms. Krabbenhoft did not comply with our request to bring her pills to be counted. She was reminded that bringing the medication bottles, even when empty, is a requirement.  Medication: None brought in. Pill/Patch Count: None available to be counted. Bottle Appearance: No container available. Did not bring bottle(s) to appointment. Filled Date: N/A Last Medication intake:  Today

## 2017-11-02 NOTE — Progress Notes (Signed)
Subjective:  Patient ID: Whitney Cline, female    DOB: 29-Sep-1945  Age: 72 y.o. MRN: [id number]  CC: Back Pain (mid)   Procedure: None  HPI Whitney Cline presents for reevaluation.  She was last seen 2 months ago and has been doing well with her current regimen.  She takes her medications averaging 2-3 times a day and this regimen has worked well for her.  Based on her narcotic assessment sheet she continues to derive good functional lifestyle improvement with her medications.  Unfortunately she has had some more problems with breathing and abdominal pain.  This is currently under workup and she is scheduled for a return evaluation after a most recent evaluation just a few days ago with her physicians.  However, at present she is doing well with her oxycodone usage.  This keeps her back pain and diffuse body pain and leg pain under good control.  The quality characteristic and distribution of these has been stable in nature.  Outpatient Medications Prior to Visit  Medication Sig Dispense Refill  . ACCU-CHEK AVIVA PLUS test strip USE QID UTD  2  . albuterol (PROVENTIL HFA;VENTOLIN HFA) 108 (90 Base) MCG/ACT inhaler Inhale 2 puffs into the lungs every 6 (six) hours as needed for wheezing or shortness of breath.    Marland Kitchen albuterol (PROVENTIL) (2.5 MG/3ML) 0.083% nebulizer solution VVN Q 6 H PRF WHZ  12  . Blood Glucose Monitoring Suppl (FIFTY50 GLUCOSE METER 2.0) w/Device KIT Use as directed. E11.9    . budesonide-formoterol (SYMBICORT) 160-4.5 MCG/ACT inhaler Inhale 2 puffs into the lungs 2 (two) times daily.    . carisoprodol (SOMA) 350 MG tablet Take 1 tablet (350 mg total) by mouth 4 (four) times daily. 30 tablet 5  . carvedilol (COREG) 3.125 MG tablet Take 3.125 mg by mouth 2 (two) times daily with a meal.    . cephALEXin (KEFLEX) 250 MG capsule Take by mouth 4 (four) times daily.    . cetirizine (ZYRTEC) 10 MG tablet Take 10 mg by mouth daily.    . cholecalciferol (VITAMIN D) 400  units TABS tablet Take 400 Units by mouth.    . furosemide (LASIX) 20 MG tablet Take 10 mg by mouth daily.     Marland Kitchen gabapentin (NEURONTIN) 300 MG capsule Take 300 mg by mouth daily at 12 noon.    Marland Kitchen glucose blood test strip USE FOUR TIMES DAILY AS INSTRUCTED    . hydrOXYzine (ATARAX/VISTARIL) 25 MG tablet Take by mouth.    . Insulin Pen Needle (PEN NEEDLES) 32G X 4 MM MISC Use for injections humalog TID times daily,  then glargine daily accucheck viva machine    . lactulose, encephalopathy, (GENERLAC) 10 GM/15ML SOLN Take 30 g by mouth daily as needed.    . Lancets (ACCU-CHEK MULTICLIX) lancets USE ONCE D AS INSTRUCTED  6  . lidocaine (XYLOCAINE) 5 % ointment Apply topically.    . liraglutide (VICTOZA) 18 MG/3ML SOPN 1.8 mg.    . metFORMIN (GLUCOPHAGE) 500 MG tablet Take 1,000 mg by mouth 2 (two) times daily with a meal.    . methocarbamol (ROBAXIN) 750 MG tablet TAKE 1 TABLET(750 MG) BY MOUTH TWICE DAILY 60 tablet 0  . naloxone (NARCAN) 0.4 MG/ML injection Inject 0.4 mg into the vein as needed.    . Naloxone HCl 0.4 MG/0.4ML SOAJ Inject 1 Units as directed once daily as needed.    . rifaximin (XIFAXAN) 550 MG TABS tablet Take 550 mg by mouth 2 (two)  times daily.    Marland Kitchen tiotropium (SPIRIVA) 18 MCG inhalation capsule Place 18 mcg into inhaler and inhale daily.    Marland Kitchen oxyCODONE (OXY IR/ROXICODONE) 5 MG immediate release tablet Take 1 tablet (5 mg total) by mouth 3 (three) times daily. 75 tablet 0  . azelastine (ASTELIN) 0.1 % nasal spray Place into the nose.    . cyclobenzaprine (FLEXERIL) 5 MG tablet TAKE 1 TABLET(5 MG) BY MOUTH THREE TIMES DAILY AS NEEDED FOR MUSCLE SPASMS (Patient not taking: Reported on 08/09/2017) 75 tablet 0  . ferrous sulfate 325 (65 FE) MG EC tablet Take by mouth.     Facility-Administered Medications Prior to Visit  Medication Dose Route Frequency Provider Last Rate Last Dose  . dexamethasone (DECADRON) injection 10 mg  10 mg Other Once Molli Barrows, MD      . dexamethasone  (DECADRON) injection 4 mg  4 mg Other Once Molli Barrows, MD      . ropivacaine (PF) 2 mg/ml (0.2%) (NAROPIN) epidural 1 mL  1 mL Epidural Once Molli Barrows, MD      . ropivacaine (PF) 2 mg/mL (0.2%) (NAROPIN) injection 10 mL  10 mL Epidural Once Molli Barrows, MD        Review of Systems CNS: No confusion or sedation Cardiac: No angina or palpitations Constitutional: No nausea vomiting fevers or chills  Objective:  BP 123/77   Pulse (!) 108   Temp 98.7 F (37.1 C)   Ht 5' 6.5" (1.689 m)   Wt 272 lb (123.4 kg)   SpO2 99%   BMI 43.24 kg/m    BP Readings from Last 3 Encounters:  11/01/17 123/77  08/09/17 138/66  06/20/17 (!) 154/85     Wt Readings from Last 3 Encounters:  11/01/17 272 lb (123.4 kg)  08/09/17 278 lb (126.1 kg)  06/20/17 266 lb (120.7 kg)     Physical Exam Pt is alert and oriented PERRL EOMI HEART IS RRR no murmur or rub LCTA no wheezing or rales MUSCULOSKELETAL reveals some paraspinous muscle tenderness but no overt trigger points.  She is using a walker for assistance.  Her lower extremity muscle tone and bulk are at baseline  Labs  Lab Results  Component Value Date   HGBA1C 9.4 (H) 10/29/2012   Lab Results  Component Value Date   CREATININE 1.70 (H) 08/19/2016    -------------------------------------------------------------------------------------------------------------------- Lab Results  Component Value Date   WBC 13.7 (H) 08/19/2016   HGB 6.5 (L) 08/19/2016   HCT 20.4 (L) 08/19/2016   PLT 246 08/19/2016   GLUCOSE 265 (H) 08/19/2016   ALT 19 08/19/2016   AST 47 (H) 08/19/2016   NA 133 (L) 08/19/2016   K 5.5 (H) 08/19/2016   CL 98 (L) 08/19/2016   CREATININE 1.70 (H) 08/19/2016   BUN 97 (H) 08/19/2016   CO2 22 08/19/2016   INR 1.1 10/28/2012   HGBA1C 9.4 (H) 10/29/2012    --------------------------------------------------------------------------------------------------------------------- Dg Chest 2 View  Result Date:  08/19/2016 CLINICAL DATA:  Dyspnea and wheezing x2 days wound EXAM: CHEST  2 VIEW COMPARISON:  Chest CT 08/12/2007, CXR 07/28/2013 0 FINDINGS: Unchanged right lower lobe well-circumscribed 3.3 cm mass dating back to 2009 consistent with a benign finding. Mild interstitial prominence noted bilaterally which may reflect bronchitic change. No alveolar consolidation, effusion or pneumothorax. Heart is top-normal in size. There is aortic atherosclerosis without aneurysm. IMPRESSION: Stable right lower lobe 3.3 cm mass dating back to 2009 consistent with a benign finding. Mild  interstitial prominence bilaterally which may reflect bronchitic change. No pulmonary consolidation. Aortic atherosclerosis. Electronically Signed   By: Ashley Royalty M.D.   On: 08/19/2016 14:20     Assessment & Plan:   Nykira was seen today for back pain.  Diagnoses and all orders for this visit:  Chronic pain syndrome  Low back pain at multiple sites  Cervicalgia  Pain in joint of right shoulder  DDD (degenerative disc disease), lumbar  Chronic, continuous use of opioids  Facet arthritis of lumbar region  Spinal stenosis, lumbar region, with neurogenic claudication  Other orders -     Discontinue: oxyCODONE (OXY IR/ROXICODONE) 5 MG immediate release tablet; Take 1 tablet (5 mg total) by mouth 3 (three) times daily. -     oxyCODONE (OXY IR/ROXICODONE) 5 MG immediate release tablet; Take 1 tablet (5 mg total) by mouth 3 (three) times daily.        ----------------------------------------------------------------------------------------------------------------------  Problem List Items Addressed This Visit    None    Visit Diagnoses    Chronic pain syndrome    -  Primary   Low back pain at multiple sites       Relevant Medications   oxyCODONE (OXY IR/ROXICODONE) 5 MG immediate release tablet   Cervicalgia       Pain in joint of right shoulder       DDD (degenerative disc disease), lumbar       Relevant  Medications   oxyCODONE (OXY IR/ROXICODONE) 5 MG immediate release tablet   Chronic, continuous use of opioids       Facet arthritis of lumbar region       Relevant Medications   oxyCODONE (OXY IR/ROXICODONE) 5 MG immediate release tablet   Spinal stenosis, lumbar region, with neurogenic claudication       Relevant Medications   oxyCODONE (OXY IR/ROXICODONE) 5 MG immediate release tablet        ----------------------------------------------------------------------------------------------------------------------  1. Chronic pain syndrome We will refill her medications as dated April 11 and May 11.  I have reviewed her urine drug screen and it is appropriate.  We have also reviewed the [facility] practitioner database information and it is appropriate as well.  She appears to be stable on her existing regimen.  She is due to follow-up with her primary care physicians and specialist for her abdominal pain which is currently under evaluation.  2. Low back pain at multiple sites   3. Cervicalgia   4. Pain in joint of right shoulder   5. DDD (degenerative disc disease), lumbar   6. Chronic, continuous use of opioids   7. Facet arthritis of lumbar region   8. Spinal stenosis, lumbar region, with neurogenic claudication     ----------------------------------------------------------------------------------------------------------------------  I am having Alyce Inscore. Meulemans maintain her albuterol, budesonide-formoterol, carvedilol, cetirizine, cholecalciferol, furosemide, gabapentin, metFORMIN, naloxone, tiotropium, glucose blood, albuterol, azelastine, FIFTY50 GLUCOSE METER 2.0, ACCU-CHEK AVIVA PLUS, hydrOXYzine, lidocaine, Pen Needles, accu-chek multiclix, ferrous sulfate, Naloxone HCl, liraglutide, methocarbamol, carisoprodol, lactulose (encephalopathy), rifaximin, cephALEXin, cyclobenzaprine, and oxyCODONE. We will continue to administer dexamethasone, ropivacaine (PF) 2 mg/mL  (0.2%), dexamethasone, and ropivacaine (PF) 2 mg/mL (0.2%).   Meds ordered this encounter  Medications  . DISCONTD: oxyCODONE (OXY IR/ROXICODONE) 5 MG immediate release tablet    Sig: Take 1 tablet (5 mg total) by mouth 3 (three) times daily.    Dispense:  75 tablet    Refill:  0    Do not fill until [id number]  . oxyCODONE (OXY IR/ROXICODONE) 5 MG immediate  release tablet    Sig: Take 1 tablet (5 mg total) by mouth 3 (three) times daily.    Dispense:  75 tablet    Refill:  0    Do not fill until [id number]   Patient's Medications  New Prescriptions   No medications on file  Previous Medications   ACCU-CHEK AVIVA PLUS TEST STRIP    USE QID UTD   ALBUTEROL (PROVENTIL HFA;VENTOLIN HFA) 108 (90 BASE) MCG/ACT INHALER    Inhale 2 puffs into the lungs every 6 (six) hours as needed for wheezing or shortness of breath.   ALBUTEROL (PROVENTIL) (2.5 MG/3ML) 0.083% NEBULIZER SOLUTION    VVN Q 6 H PRF WHZ   AZELASTINE (ASTELIN) 0.1 % NASAL SPRAY    Place into the nose.   BLOOD GLUCOSE MONITORING SUPPL (FIFTY50 GLUCOSE METER 2.0) W/DEVICE KIT    Use as directed. E11.9   BUDESONIDE-FORMOTEROL (SYMBICORT) 160-4.5 MCG/ACT INHALER    Inhale 2 puffs into the lungs 2 (two) times daily.   CARISOPRODOL (SOMA) 350 MG TABLET    Take 1 tablet (350 mg total) by mouth 4 (four) times daily.   CARVEDILOL (COREG) 3.125 MG TABLET    Take 3.125 mg by mouth 2 (two) times daily with a meal.   CEPHALEXIN (KEFLEX) 250 MG CAPSULE    Take by mouth 4 (four) times daily.   CETIRIZINE (ZYRTEC) 10 MG TABLET    Take 10 mg by mouth daily.   CHOLECALCIFEROL (VITAMIN D) 400 UNITS TABS TABLET    Take 400 Units by mouth.   CYCLOBENZAPRINE (FLEXERIL) 5 MG TABLET    TAKE 1 TABLET(5 MG) BY MOUTH THREE TIMES DAILY AS NEEDED FOR MUSCLE SPASMS   FERROUS SULFATE 325 (65 FE) MG EC TABLET    Take by mouth.   FUROSEMIDE (LASIX) 20 MG TABLET    Take 10 mg by mouth daily.    GABAPENTIN (NEURONTIN) 300 MG CAPSULE    Take 300 mg by mouth daily at  12 noon.   GLUCOSE BLOOD TEST STRIP    USE FOUR TIMES DAILY AS INSTRUCTED   HYDROXYZINE (ATARAX/VISTARIL) 25 MG TABLET    Take by mouth.   INSULIN PEN NEEDLE (PEN NEEDLES) 32G X 4 MM MISC    Use for injections humalog TID times daily,  then glargine daily accucheck viva machine   LACTULOSE, ENCEPHALOPATHY, (GENERLAC) 10 GM/15ML SOLN    Take 30 g by mouth daily as needed.   LANCETS (ACCU-CHEK MULTICLIX) LANCETS    USE ONCE D AS INSTRUCTED   LIDOCAINE (XYLOCAINE) 5 % OINTMENT    Apply topically.   LIRAGLUTIDE (VICTOZA) 18 MG/3ML SOPN    1.8 mg.   METFORMIN (GLUCOPHAGE) 500 MG TABLET    Take 1,000 mg by mouth 2 (two) times daily with a meal.   METHOCARBAMOL (ROBAXIN) 750 MG TABLET    TAKE 1 TABLET(750 MG) BY MOUTH TWICE DAILY   NALOXONE (NARCAN) 0.4 MG/ML INJECTION    Inject 0.4 mg into the vein as needed.   NALOXONE HCL 0.4 MG/0.4ML SOAJ    Inject 1 Units as directed once daily as needed.   RIFAXIMIN (XIFAXAN) 550 MG TABS TABLET    Take 550 mg by mouth 2 (two) times daily.   TIOTROPIUM (SPIRIVA) 18 MCG INHALATION CAPSULE    Place 18 mcg into inhaler and inhale daily.  Modified Medications   Modified Medication Previous Medication   OXYCODONE (OXY IR/ROXICODONE) 5 MG IMMEDIATE RELEASE TABLET oxyCODONE (OXY IR/ROXICODONE) 5 MG immediate release tablet  Take 1 tablet (5 mg total) by mouth 3 (three) times daily.    Take 1 tablet (5 mg total) by mouth 3 (three) times daily.  Discontinued Medications   No medications on file   ----------------------------------------------------------------------------------------------------------------------  Follow-up: Return in about 2 months (around 01/01/2018) for evaluation, med refill.    Molli Barrows, MD

## 2017-11-07 ENCOUNTER — Ambulatory Visit: Admit: 2017-11-07 | Discharge: 2017-11-17 | Disposition: A | Discharge status: Home Health | Payer: MEDICARE

## 2017-11-07 DIAGNOSIS — R188 Other ascites: Principal | ICD-10-CM

## 2017-11-08 DIAGNOSIS — R188 Other ascites: Principal | ICD-10-CM

## 2017-11-09 DIAGNOSIS — R188 Other ascites: Principal | ICD-10-CM

## 2017-11-10 DIAGNOSIS — R188 Other ascites: Principal | ICD-10-CM

## 2017-11-11 DIAGNOSIS — R188 Other ascites: Principal | ICD-10-CM

## 2017-11-12 DIAGNOSIS — R188 Other ascites: Principal | ICD-10-CM

## 2017-11-13 DIAGNOSIS — R188 Other ascites: Principal | ICD-10-CM

## 2017-11-15 DIAGNOSIS — R188 Other ascites: Principal | ICD-10-CM

## 2017-11-17 MED ORDER — TIOTROPIUM BROMIDE 18 MCG CAPSULE WITH INHALATION DEVICE
ORAL_CAPSULE | Freq: Every day | RESPIRATORY_TRACT | 11 refills | 0 days
Start: 2017-11-17 — End: 2018-11-17

## 2017-11-18 ENCOUNTER — Ambulatory Visit
Admit: 2017-11-18 | Discharge: 2017-11-26 | Disposition: A | Discharge status: Home / Self Care | Payer: MEDICARE | Admitting: Internal Medicine

## 2017-11-18 ENCOUNTER — Encounter
Admit: 2017-11-18 | Discharge: 2017-11-26 | Disposition: A | Discharge status: Home / Self Care | Payer: MEDICARE | Attending: Certified Registered" | Admitting: Internal Medicine

## 2017-11-21 ENCOUNTER — Encounter: Payer: Medicare HMO | Admitting: Anesthesiology

## 2017-11-21 DIAGNOSIS — K921 Melena: Principal | ICD-10-CM

## 2017-11-22 DIAGNOSIS — K921 Melena: Principal | ICD-10-CM

## 2017-11-24 DIAGNOSIS — K921 Melena: Principal | ICD-10-CM

## 2017-11-26 MED ORDER — MUPIROCIN CALCIUM 2 % TOPICAL CREAM
Freq: Three times a day (TID) | TOPICAL | 0 refills | 0.00000 days | Status: CP
Start: 2017-11-26 — End: 2017-12-03

## 2017-11-26 MED ORDER — PANTOPRAZOLE 40 MG TABLET,DELAYED RELEASE
ORAL_TABLET | Freq: Two times a day (BID) | ORAL | 1 refills | 0.00000 days | Status: CP
Start: 2017-11-26 — End: 2018-02-15

## 2017-11-26 MED ORDER — DOXYCYCLINE HYCLATE 100 MG CAPSULE
ORAL_CAPSULE | Freq: Two times a day (BID) | ORAL | 0 refills | 0.00000 days | Status: CP
Start: 2017-11-26 — End: 2019-01-26

## 2017-11-26 MED ORDER — SUCRALFATE 100 MG/ML ORAL SUSPENSION
Freq: Four times a day (QID) | ORAL | 0 refills | 0.00000 days | Status: CP
Start: 2017-11-26 — End: 2017-11-30

## 2017-11-27 MED ORDER — LEVOFLOXACIN 750 MG TABLET
ORAL_TABLET | ORAL | 0 refills | 0.00000 days | Status: CP
Start: 2017-11-27 — End: 2017-12-02

## 2017-11-30 ENCOUNTER — Institutional Professional Consult (permissible substitution): Admit: 2017-11-30 | Discharge: 2017-11-30 | Payer: MEDICARE

## 2017-11-30 DIAGNOSIS — K7460 Unspecified cirrhosis of liver: Principal | ICD-10-CM

## 2017-11-30 DIAGNOSIS — R188 Other ascites: Secondary | ICD-10-CM

## 2017-11-30 DIAGNOSIS — I8500 Esophageal varices without bleeding: Secondary | ICD-10-CM

## 2017-12-03 ENCOUNTER — Ambulatory Visit: Admit: 2017-12-03 | Discharge: 2017-12-03 | Payer: MEDICARE

## 2017-12-03 DIAGNOSIS — K7460 Unspecified cirrhosis of liver: Principal | ICD-10-CM

## 2017-12-03 DIAGNOSIS — C220 Liver cell carcinoma: Secondary | ICD-10-CM

## 2017-12-03 DIAGNOSIS — R188 Other ascites: Secondary | ICD-10-CM

## 2017-12-07 ENCOUNTER — Institutional Professional Consult (permissible substitution): Admit: 2017-12-07 | Discharge: 2017-12-08 | Payer: MEDICARE

## 2017-12-07 DIAGNOSIS — K7460 Unspecified cirrhosis of liver: Principal | ICD-10-CM

## 2017-12-07 DIAGNOSIS — B3781 Candidal esophagitis: Secondary | ICD-10-CM

## 2017-12-07 DIAGNOSIS — B370 Candidal stomatitis: Secondary | ICD-10-CM

## 2017-12-07 DIAGNOSIS — R188 Other ascites: Secondary | ICD-10-CM

## 2017-12-07 MED ORDER — NYSTATIN 100,000 UNIT/ML ORAL SUSPENSION
Freq: Four times a day (QID) | ORAL | 0 refills | 0 days | Status: CP
Start: 2017-12-07 — End: 2018-01-04

## 2017-12-07 MED ORDER — FLUCONAZOLE 100 MG TABLET
ORAL_TABLET | Freq: Every day | ORAL | 0 refills | 0 days | Status: CP
Start: 2017-12-07 — End: 2017-12-21

## 2017-12-14 ENCOUNTER — Institutional Professional Consult (permissible substitution): Admit: 2017-12-14 | Discharge: 2017-12-15 | Payer: MEDICARE

## 2017-12-14 DIAGNOSIS — K7460 Unspecified cirrhosis of liver: Principal | ICD-10-CM

## 2017-12-14 DIAGNOSIS — R188 Other ascites: Secondary | ICD-10-CM

## 2017-12-14 DIAGNOSIS — B369 Superficial mycosis, unspecified: Secondary | ICD-10-CM

## 2017-12-14 MED ORDER — NYSTATIN 100,000 UNIT/GRAM TOPICAL POWDER
Freq: Four times a day (QID) | TOPICAL | 6 refills | 0.00000 days | Status: CP
Start: 2017-12-14 — End: ?

## 2017-12-14 MED ORDER — MUPIROCIN 2 % TOPICAL OINTMENT
Freq: Three times a day (TID) | TOPICAL | 6 refills | 0 days | Status: CP
Start: 2017-12-14 — End: 2017-12-21

## 2017-12-21 ENCOUNTER — Institutional Professional Consult (permissible substitution): Admit: 2017-12-21 | Discharge: 2017-12-22 | Payer: MEDICARE

## 2017-12-21 DIAGNOSIS — K7460 Unspecified cirrhosis of liver: Principal | ICD-10-CM

## 2017-12-21 DIAGNOSIS — R188 Other ascites: Secondary | ICD-10-CM

## 2017-12-28 ENCOUNTER — Institutional Professional Consult (permissible substitution): Admit: 2017-12-28 | Discharge: 2017-12-29 | Payer: MEDICARE

## 2017-12-28 DIAGNOSIS — K7460 Unspecified cirrhosis of liver: Principal | ICD-10-CM

## 2017-12-28 DIAGNOSIS — R188 Other ascites: Secondary | ICD-10-CM

## 2018-01-04 ENCOUNTER — Institutional Professional Consult (permissible substitution): Admit: 2018-01-04 | Discharge: 2018-01-05 | Payer: MEDICARE

## 2018-01-04 DIAGNOSIS — K7460 Unspecified cirrhosis of liver: Principal | ICD-10-CM

## 2018-01-11 ENCOUNTER — Institutional Professional Consult (permissible substitution): Admit: 2018-01-11 | Discharge: 2018-01-12 | Payer: MEDICARE

## 2018-01-11 DIAGNOSIS — K7460 Unspecified cirrhosis of liver: Principal | ICD-10-CM

## 2018-01-11 DIAGNOSIS — R188 Other ascites: Secondary | ICD-10-CM

## 2018-01-17 MED ORDER — LACTULOSE 10 GRAM/15 ML ORAL SOLUTION
Freq: Three times a day (TID) | ORAL | 0 refills | 0.00000 days | Status: CP
Start: 2018-01-17 — End: 2018-07-16

## 2018-01-18 ENCOUNTER — Institutional Professional Consult (permissible substitution): Admit: 2018-01-18 | Discharge: 2018-01-18 | Payer: MEDICARE

## 2018-01-18 DIAGNOSIS — R188 Other ascites: Secondary | ICD-10-CM

## 2018-01-18 DIAGNOSIS — K7460 Unspecified cirrhosis of liver: Principal | ICD-10-CM

## 2018-01-19 ENCOUNTER — Ambulatory Visit: Payer: Medicare HMO | Attending: Anesthesiology | Admitting: Anesthesiology

## 2018-01-19 ENCOUNTER — Encounter: Payer: Self-pay | Admitting: Anesthesiology

## 2018-01-19 ENCOUNTER — Other Ambulatory Visit: Payer: Self-pay

## 2018-01-19 VITALS — BP 115/46 | HR 72 | Temp 98.5°F | Resp 18 | Ht 66.0 in | Wt 258.0 lb

## 2018-01-19 DIAGNOSIS — Z794 Long term (current) use of insulin: Secondary | ICD-10-CM | POA: Diagnosis not present

## 2018-01-19 DIAGNOSIS — F1190 Opioid use, unspecified, uncomplicated: Secondary | ICD-10-CM | POA: Diagnosis not present

## 2018-01-19 DIAGNOSIS — M48062 Spinal stenosis, lumbar region with neurogenic claudication: Secondary | ICD-10-CM | POA: Insufficient documentation

## 2018-01-19 DIAGNOSIS — G894 Chronic pain syndrome: Secondary | ICD-10-CM | POA: Insufficient documentation

## 2018-01-19 DIAGNOSIS — M47816 Spondylosis without myelopathy or radiculopathy, lumbar region: Secondary | ICD-10-CM | POA: Diagnosis not present

## 2018-01-19 DIAGNOSIS — Z79891 Long term (current) use of opiate analgesic: Secondary | ICD-10-CM | POA: Diagnosis not present

## 2018-01-19 DIAGNOSIS — M542 Cervicalgia: Secondary | ICD-10-CM | POA: Insufficient documentation

## 2018-01-19 DIAGNOSIS — Z760 Encounter for issue of repeat prescription: Secondary | ICD-10-CM | POA: Diagnosis present

## 2018-01-19 DIAGNOSIS — Z7951 Long term (current) use of inhaled steroids: Secondary | ICD-10-CM | POA: Insufficient documentation

## 2018-01-19 DIAGNOSIS — M545 Low back pain, unspecified: Secondary | ICD-10-CM

## 2018-01-19 DIAGNOSIS — M25511 Pain in right shoulder: Secondary | ICD-10-CM | POA: Diagnosis not present

## 2018-01-19 DIAGNOSIS — M5136 Other intervertebral disc degeneration, lumbar region: Secondary | ICD-10-CM | POA: Diagnosis not present

## 2018-01-19 DIAGNOSIS — M4696 Unspecified inflammatory spondylopathy, lumbar region: Secondary | ICD-10-CM | POA: Diagnosis not present

## 2018-01-19 MED ORDER — DEXAMETHASONE SODIUM PHOSPHATE 10 MG/ML IJ SOLN
INTRAMUSCULAR | Status: AC
  Filled 2018-01-19: qty 1

## 2018-01-19 MED ORDER — ROPIVACAINE HCL 2 MG/ML IJ SOLN
INTRAMUSCULAR | Status: AC
  Filled 2018-01-19: qty 10

## 2018-01-19 MED ORDER — ROPIVACAINE HCL 2 MG/ML IJ SOLN: 10.0000 mL | Freq: Once | INTRAMUSCULAR | Status: DC

## 2018-01-19 MED ORDER — DEXAMETHASONE SODIUM PHOSPHATE 4 MG/ML IJ SOLN: 4.0000 mg | Freq: Once | INTRAMUSCULAR | Status: DC

## 2018-01-19 MED ORDER — OXYCODONE HCL 5 MG PO TABS: 5.0000 mg | ORAL_TABLET | Freq: Three times a day (TID) | ORAL | 0 refills | Status: DC

## 2018-01-19 MED ORDER — METHOCARBAMOL 750 MG PO TABS: 750.0000 mg | ORAL_TABLET | Freq: Three times a day (TID) | ORAL | 2 refills | Status: DC

## 2018-01-19 MED ORDER — OXYCODONE HCL 5 MG PO TABS
5.0000 mg | ORAL_TABLET | Freq: Three times a day (TID) | ORAL | 0 refills | Status: DC
Start: 2018-01-19 — End: 2018-03-21

## 2018-01-19 NOTE — Patient Instructions (Signed)
You have been given 2 prescriptions for Oxycodone IR to last until 03/21/2018.   ____________________________________________________________________________________________  Preparing for your procedure (without sedation)  Instructions: . Oral Intake: Do not eat or drink anything for at least 3 hours prior to your procedure. . Transportation: Unless otherwise stated by your physician, you may drive yourself after the procedure. . Blood Pressure Medicine: Take your blood pressure medicine with a sip of water the morning of the procedure. . Blood thinners:  . Diabetics on insulin: Notify the staff so that you can be scheduled 1st case in the morning. If your diabetes requires high dose insulin, take only  of your normal insulin dose the morning of the procedure and notify the staff that you have done so. . Preventing infections: Shower with an antibacterial soap the morning of your procedure.  . Build-up your immune system: Take 1000 mg of Vitamin C with every meal (3 times a day) the day prior to your procedure. Marland Kitchen Antibiotics: Inform the staff if you have a condition or reason that requires you to take antibiotics before dental procedures. . Pregnancy: If you are pregnant, call and cancel the procedure. . Sickness: If you have a cold, fever, or any active infections, call and cancel the procedure. . Arrival: You must be in the facility at least 30 minutes prior to your scheduled procedure. . Children: Do not bring any children with you. . Dress appropriately: Bring dark clothing that you would not mind if they get stained. . Valuables: Do not bring any jewelry or valuables.  Procedure appointments are reserved for interventional treatments only. Marland Kitchen No Prescription Refills. . No medication changes will be discussed during procedure appointments. . No disability issues will be discussed.  Remember:  Regular Business hours are:  Monday to Thursday 8:00 AM to 4:00 PM  Provider's  Schedule: Milinda Pointer, MD:  Procedure days: Tuesday and Thursday 7:30 AM to 4:00 PM  Gillis Santa, MD:  Procedure days: Monday and Wednesday 7:30 AM to 4:00 PM ____________________________________________________________________________________________

## 2018-01-19 NOTE — Progress Notes (Signed)
Safety precautions to be maintained throughout the outpatient stay will include: orient to surroundings, keep bed in low position, maintain call bell within reach at all times, provide assistance with transfer out of bed and ambulation.   Nursing Pain Medication Assessment:  Safety precautions to be maintained throughout the outpatient stay will include: orient to surroundings, keep bed in low position, maintain call bell within reach at all times, provide assistance with transfer out of bed and ambulation.  Medication Inspection Compliance: Pill count conducted under aseptic conditions, in front of the patient. Neither the pills nor the bottle was removed from the patient's sight at any time. Once count was completed pills were immediately returned to the patient in their original bottle.  Medication: Oxycodone IR Pill/Patch Count: 06 of 75 pills remain Pill/Patch Appearance: Markings consistent with prescribed medication Bottle Appearance: Standard pharmacy container. Clearly labeled. Filled Date: 05 / 22 / 2019 Last Medication intake:  Today

## 2018-01-19 NOTE — Progress Notes (Signed)
Subjective:  Patient ID: Whitney Cline, female    DOB: 1946-05-08  Age: 72 y.o. MRN: [id number]  CC: Medication Refill (Oxycodone ) and Trigger Point Injection (right shoulder)   Procedure: Right trapezius trigger point injection x2  HPI Whitney Cline presents for reevaluation.  She was last seen 2 months ago and has been doing reasonably well except for some recent exacerbation of pain in the right shoulder and neck region.  No other change in quality characteristic or distribution of her low back pain are noted at this time.  She is also taking her oxycodone 5 mg tablets without difficulty.  She is having frequent breakthrough pain at night with the twice daily dosing.  The quality characteristic of her low back is been stable without change in bowel or bladder function.  She is recently been in the hospital and recovering.  Outpatient Medications Prior to Visit  Medication Sig Dispense Refill  . ACCU-CHEK AVIVA PLUS test strip USE QID UTD  2  . albuterol (PROVENTIL HFA;VENTOLIN HFA) 108 (90 Base) MCG/ACT inhaler Inhale 2 puffs into the lungs every 6 (six) hours as needed for wheezing or shortness of breath.    Marland Kitchen albuterol (PROVENTIL) (2.5 MG/3ML) 0.083% nebulizer solution VVN Q 6 H PRF WHZ  12  . Blood Glucose Monitoring Suppl (FIFTY50 GLUCOSE METER 2.0) w/Device KIT Use as directed. E11.9    . budesonide-formoterol (SYMBICORT) 160-4.5 MCG/ACT inhaler Inhale 2 puffs into the lungs 2 (two) times daily.    . carisoprodol (SOMA) 350 MG tablet Take 1 tablet (350 mg total) by mouth 4 (four) times daily. 30 tablet 5  . carvedilol (COREG) 3.125 MG tablet Take 3.125 mg by mouth 2 (two) times daily with a meal.    . cephALEXin (KEFLEX) 250 MG capsule Take by mouth 4 (four) times daily.    . cetirizine (ZYRTEC) 10 MG tablet Take 10 mg by mouth daily.    . cholecalciferol (VITAMIN D) 400 units TABS tablet Take 400 Units by mouth.    . fluconazole (DIFLUCAN) 100 MG tablet   0  .  gabapentin (NEURONTIN) 300 MG capsule Take 300 mg by mouth daily at 12 noon.    Marland Kitchen glucose blood test strip USE FOUR TIMES DAILY AS INSTRUCTED    . hydrOXYzine (ATARAX/VISTARIL) 25 MG tablet Take by mouth.    . Insulin Pen Needle (PEN NEEDLES) 32G X 4 MM MISC Use for injections humalog TID times daily,  then glargine daily accucheck viva machine    . lactulose, encephalopathy, (GENERLAC) 10 GM/15ML SOLN Take 30 g by mouth daily as needed.    . Lancets (ACCU-CHEK MULTICLIX) lancets USE ONCE D AS INSTRUCTED  6  . lidocaine (XYLOCAINE) 5 % ointment Apply topically.    . liraglutide (VICTOZA) 18 MG/3ML SOPN 1.8 mg.    . metFORMIN (GLUCOPHAGE) 500 MG tablet Take 1,000 mg by mouth 2 (two) times daily with a meal.    . naloxone (NARCAN) 0.4 MG/ML injection Inject 0.4 mg into the vein as needed.    . Naloxone HCl 0.4 MG/0.4ML SOAJ Inject 1 Units as directed once daily as needed.    . pantoprazole (PROTONIX) 40 MG tablet   1  . rifaximin (XIFAXAN) 550 MG TABS tablet Take 550 mg by mouth 2 (two) times daily.    Marland Kitchen tiotropium (SPIRIVA) 18 MCG inhalation capsule Place 18 mcg into inhaler and inhale daily.    . methocarbamol (ROBAXIN) 750 MG tablet TAKE 1 TABLET(750 MG) BY MOUTH  TWICE DAILY 60 tablet 0  . oxyCODONE (OXY IR/ROXICODONE) 5 MG immediate release tablet Take 1 tablet (5 mg total) by mouth 3 (three) times daily. 75 tablet 0  . azelastine (ASTELIN) 0.1 % nasal spray Place into the nose.    . cyclobenzaprine (FLEXERIL) 5 MG tablet TAKE 1 TABLET(5 MG) BY MOUTH THREE TIMES DAILY AS NEEDED FOR MUSCLE SPASMS (Patient not taking: Reported on 08/09/2017) 75 tablet 0  . ferrous sulfate 325 (65 FE) MG EC tablet Take by mouth.    . furosemide (LASIX) 20 MG tablet Take 10 mg by mouth daily.      Facility-Administered Medications Prior to Visit  Medication Dose Route Frequency Provider Last Rate Last Dose  . dexamethasone (DECADRON) injection 10 mg  10 mg Other Once Molli Barrows, MD      . dexamethasone  (DECADRON) injection 4 mg  4 mg Other Once Molli Barrows, MD      . ropivacaine (PF) 2 mg/ml (0.2%) (NAROPIN) epidural 1 mL  1 mL Epidural Once Molli Barrows, MD      . ropivacaine (PF) 2 mg/mL (0.2%) (NAROPIN) injection 10 mL  10 mL Epidural Once Molli Barrows, MD        Review of Systems CNS: No confusion or sedation Cardiac: No angina or palpitations GI: No abdominal pain or constipation Constitutional: No nausea vomiting fevers or chills  Objective:  BP (!) 115/46   Pulse 72   Temp 98.5 F (36.9 C) (Oral)   Resp 18   Ht '5\' 6"'$  (1.676 m)   Wt 258 lb (117 kg)   SpO2 100%   BMI 41.64 kg/m    BP Readings from Last 3 Encounters:  01/19/18 (!) 115/46  11/01/17 123/77  08/09/17 138/66     Wt Readings from Last 3 Encounters:  01/19/18 258 lb (117 kg)  11/01/17 272 lb (123.4 kg)  08/09/17 278 lb (126.1 kg)     Physical Exam Pt is alert and oriented PERRL EOMI HEART IS RRR no murmur or rub LCTA no wheezing or rales no increased work of breathing MUSCULOSKELETAL reveals a trigger point in the right mid body trapezius x2 and her lower extremity strength and function is at baseline.  She is using a wheelchair for ambulation at this point and continues on continuous oxygen therapy.  Labs  Lab Results  Component Value Date   HGBA1C 9.4 (H) 10/29/2012   Lab Results  Component Value Date   CREATININE 1.70 (H) 08/19/2016    -------------------------------------------------------------------------------------------------------------------- Lab Results  Component Value Date   WBC 13.7 (H) 08/19/2016   HGB 6.5 (L) 08/19/2016   HCT 20.4 (L) 08/19/2016   PLT 246 08/19/2016   GLUCOSE 265 (H) 08/19/2016   ALT 19 08/19/2016   AST 47 (H) 08/19/2016   NA 133 (L) 08/19/2016   K 5.5 (H) 08/19/2016   CL 98 (L) 08/19/2016   CREATININE 1.70 (H) 08/19/2016   BUN 97 (H) 08/19/2016   CO2 22 08/19/2016   INR 1.1 10/28/2012   HGBA1C 9.4 (H) 10/29/2012     --------------------------------------------------------------------------------------------------------------------- Dg Chest 2 View  Result Date: 08/19/2016 CLINICAL DATA:  Dyspnea and wheezing x2 days wound EXAM: CHEST  2 VIEW COMPARISON:  Chest CT 08/12/2007, CXR 07/28/2013 0 FINDINGS: Unchanged right lower lobe well-circumscribed 3.3 cm mass dating back to 2009 consistent with a benign finding. Mild interstitial prominence noted bilaterally which may reflect bronchitic change. No alveolar consolidation, effusion or pneumothorax. Heart is top-normal in size.  There is aortic atherosclerosis without aneurysm. IMPRESSION: Stable right lower lobe 3.3 cm mass dating back to 2009 consistent with a benign finding. Mild interstitial prominence bilaterally which may reflect bronchitic change. No pulmonary consolidation. Aortic atherosclerosis. Electronically Signed   By: Ashley Royalty M.D.   On: 08/19/2016 14:20     Assessment & Plan:   Whitney Cline was seen today for medication refill and trigger point injection.  Diagnoses and all orders for this visit:  Chronic pain syndrome  Low back pain at multiple sites  Cervicalgia  Pain in joint of right shoulder  DDD (degenerative disc disease), lumbar  Chronic, continuous use of opioids  Facet arthritis of lumbar region  Spinal stenosis, lumbar region, with neurogenic claudication  Other orders -     Discontinue: oxyCODONE (OXY IR/ROXICODONE) 5 MG immediate release tablet; Take 1 tablet (5 mg total) by mouth 3 (three) times daily. -     oxyCODONE (OXY IR/ROXICODONE) 5 MG immediate release tablet; Take 1 tablet (5 mg total) by mouth 3 (three) times daily. -     dexamethasone (DECADRON) injection 4 mg -     ropivacaine (PF) 2 mg/mL (0.2%) (NAROPIN) injection 10 mL -     methocarbamol (ROBAXIN) 750 MG tablet; Take 1 tablet (750 mg total) by mouth 3 (three) times  daily.        ----------------------------------------------------------------------------------------------------------------------  Problem List Items Addressed This Visit    None    Visit Diagnoses    Chronic pain syndrome    -  Primary   Low back pain at multiple sites       Relevant Medications   oxyCODONE (OXY IR/ROXICODONE) 5 MG immediate release tablet   dexamethasone (DECADRON) injection 4 mg   methocarbamol (ROBAXIN) 750 MG tablet   Cervicalgia       Pain in joint of right shoulder       DDD (degenerative disc disease), lumbar       Relevant Medications   oxyCODONE (OXY IR/ROXICODONE) 5 MG immediate release tablet   dexamethasone (DECADRON) injection 4 mg   methocarbamol (ROBAXIN) 750 MG tablet   Chronic, continuous use of opioids       Facet arthritis of lumbar region       Relevant Medications   oxyCODONE (OXY IR/ROXICODONE) 5 MG immediate release tablet   dexamethasone (DECADRON) injection 4 mg   methocarbamol (ROBAXIN) 750 MG tablet   Spinal stenosis, lumbar region, with neurogenic claudication       Relevant Medications   oxyCODONE (OXY IR/ROXICODONE) 5 MG immediate release tablet   dexamethasone (DECADRON) injection 4 mg   methocarbamol (ROBAXIN) 750 MG tablet        ----------------------------------------------------------------------------------------------------------------------  1. Chronic pain syndrome I am going to allow her to increase her dosing on the oxycodone 5 mg tablets to 3 times daily dosing.  We have reviewed the [facility] practitioner database information and it is appropriate.  Refills will be given for June 20 and July 20  2. Low back pain at multiple sites As above  3. Cervicalgia We will perform a trigger point injection as per patient request to the right trapezius today.  The risks and benefits of been reviewed with her in full detail  4. Pain in joint of right shoulder As above  5. DDD (degenerative disc disease),  lumbar Continue stretching exercises as tolerated  6. Chronic, continuous use of opioids As above with return to clinic in 2 months  7. Facet arthritis of lumbar region  8. Spinal stenosis, lumbar region, with neurogenic claudication     ----------------------------------------------------------------------------------------------------------------------  I have changed Whitney Cline's methocarbamol. I am also having her maintain her albuterol, budesonide-formoterol, carvedilol, cetirizine, cholecalciferol, furosemide, gabapentin, metFORMIN, naloxone, tiotropium, glucose blood, albuterol, azelastine, FIFTY50 GLUCOSE METER 2.0, ACCU-CHEK AVIVA PLUS, hydrOXYzine, lidocaine, Pen Needles, accu-chek multiclix, ferrous sulfate, Naloxone HCl, liraglutide, carisoprodol, lactulose (encephalopathy), rifaximin, cephALEXin, cyclobenzaprine, fluconazole, pantoprazole, and oxyCODONE. We will continue to administer dexamethasone, ropivacaine (PF) 2 mg/mL (0.2%), dexamethasone, and ropivacaine (PF) 2 mg/mL (0.2%).   Meds ordered this encounter  Medications  . DISCONTD: oxyCODONE (OXY IR/ROXICODONE) 5 MG immediate release tablet    Sig: Take 1 tablet (5 mg total) by mouth 3 (three) times daily.    Dispense:  90 tablet    Refill:  0    Do not fill until [id number]  . oxyCODONE (OXY IR/ROXICODONE) 5 MG immediate release tablet    Sig: Take 1 tablet (5 mg total) by mouth 3 (three) times daily.    Dispense:  90 tablet    Refill:  0    Do not fill until [id number]  . dexamethasone (DECADRON) injection 4 mg  . ropivacaine (PF) 2 mg/mL (0.2%) (NAROPIN) injection 10 mL  . methocarbamol (ROBAXIN) 750 MG tablet    Sig: Take 1 tablet (750 mg total) by mouth 3 (three) times daily.    Dispense:  90 tablet    Refill:  2   Patient's Medications  New Prescriptions   No medications on file  Previous Medications   ACCU-CHEK AVIVA PLUS TEST STRIP    USE QID UTD   ALBUTEROL (PROVENTIL HFA;VENTOLIN HFA) 108 (90  BASE) MCG/ACT INHALER    Inhale 2 puffs into the lungs every 6 (six) hours as needed for wheezing or shortness of breath.   ALBUTEROL (PROVENTIL) (2.5 MG/3ML) 0.083% NEBULIZER SOLUTION    VVN Q 6 H PRF WHZ   AZELASTINE (ASTELIN) 0.1 % NASAL SPRAY    Place into the nose.   BLOOD GLUCOSE MONITORING SUPPL (FIFTY50 GLUCOSE METER 2.0) W/DEVICE KIT    Use as directed. E11.9   BUDESONIDE-FORMOTEROL (SYMBICORT) 160-4.5 MCG/ACT INHALER    Inhale 2 puffs into the lungs 2 (two) times daily.   CARISOPRODOL (SOMA) 350 MG TABLET    Take 1 tablet (350 mg total) by mouth 4 (four) times daily.   CARVEDILOL (COREG) 3.125 MG TABLET    Take 3.125 mg by mouth 2 (two) times daily with a meal.   CEPHALEXIN (KEFLEX) 250 MG CAPSULE    Take by mouth 4 (four) times daily.   CETIRIZINE (ZYRTEC) 10 MG TABLET    Take 10 mg by mouth daily.   CHOLECALCIFEROL (VITAMIN D) 400 UNITS TABS TABLET    Take 400 Units by mouth.   CYCLOBENZAPRINE (FLEXERIL) 5 MG TABLET    TAKE 1 TABLET(5 MG) BY MOUTH THREE TIMES DAILY AS NEEDED FOR MUSCLE SPASMS   FERROUS SULFATE 325 (65 FE) MG EC TABLET    Take by mouth.   FLUCONAZOLE (DIFLUCAN) 100 MG TABLET       FUROSEMIDE (LASIX) 20 MG TABLET    Take 10 mg by mouth daily.    GABAPENTIN (NEURONTIN) 300 MG CAPSULE    Take 300 mg by mouth daily at 12 noon.   GLUCOSE BLOOD TEST STRIP    USE FOUR TIMES DAILY AS INSTRUCTED   HYDROXYZINE (ATARAX/VISTARIL) 25 MG TABLET    Take by mouth.   INSULIN PEN NEEDLE (PEN NEEDLES) 32G X 4 MM MISC  Use for injections humalog TID times daily,  then glargine daily accucheck viva machine   LACTULOSE, ENCEPHALOPATHY, (GENERLAC) 10 GM/15ML SOLN    Take 30 g by mouth daily as needed.   LANCETS (ACCU-CHEK MULTICLIX) LANCETS    USE ONCE D AS INSTRUCTED   LIDOCAINE (XYLOCAINE) 5 % OINTMENT    Apply topically.   LIRAGLUTIDE (VICTOZA) 18 MG/3ML SOPN    1.8 mg.   METFORMIN (GLUCOPHAGE) 500 MG TABLET    Take 1,000 mg by mouth 2 (two) times daily with a meal.   NALOXONE  (NARCAN) 0.4 MG/ML INJECTION    Inject 0.4 mg into the vein as needed.   NALOXONE HCL 0.4 MG/0.4ML SOAJ    Inject 1 Units as directed once daily as needed.   PANTOPRAZOLE (PROTONIX) 40 MG TABLET       RIFAXIMIN (XIFAXAN) 550 MG TABS TABLET    Take 550 mg by mouth 2 (two) times daily.   TIOTROPIUM (SPIRIVA) 18 MCG INHALATION CAPSULE    Place 18 mcg into inhaler and inhale daily.  Modified Medications   Modified Medication Previous Medication   METHOCARBAMOL (ROBAXIN) 750 MG TABLET methocarbamol (ROBAXIN) 750 MG tablet      Take 1 tablet (750 mg total) by mouth 3 (three) times daily.    TAKE 1 TABLET(750 MG) BY MOUTH TWICE DAILY   OXYCODONE (OXY IR/ROXICODONE) 5 MG IMMEDIATE RELEASE TABLET oxyCODONE (OXY IR/ROXICODONE) 5 MG immediate release tablet      Take 1 tablet (5 mg total) by mouth 3 (three) times daily.    Take 1 tablet (5 mg total) by mouth 3 (three) times daily.  Discontinued Medications   No medications on file   ----------------------------------------------------------------------------------------------------------------------  Follow-up: Return for evaluation, med refill.  Trigger point injection: The area overlying the aforementioned trigger points were prepped with alcohol. They were then injected with a 25-gauge needle with 4 cc of ropivacaine 0.2% and Decadron 2 mg at each site after negative aspiration for heme. This was performed after informed consent was obtained and risks and benefits reviewed. She tolerated this procedure without difficulty and was convalesced and discharged to home in stable condition for follow-up as mentioned.  '@Cheng Dec'$  Modine Oppenheimer, MD@  Molli Barrows, MD

## 2018-01-20 ENCOUNTER — Telehealth: Payer: Self-pay

## 2018-01-20 NOTE — Telephone Encounter (Signed)
Denies any needs at this time. Instructed to call if needed. 

## 2018-01-25 ENCOUNTER — Institutional Professional Consult (permissible substitution): Admit: 2018-01-25 | Discharge: 2018-01-26 | Payer: MEDICARE

## 2018-01-25 DIAGNOSIS — R1033 Periumbilical pain: Secondary | ICD-10-CM

## 2018-01-25 DIAGNOSIS — K7460 Unspecified cirrhosis of liver: Principal | ICD-10-CM

## 2018-01-25 DIAGNOSIS — R188 Other ascites: Secondary | ICD-10-CM

## 2018-02-01 ENCOUNTER — Institutional Professional Consult (permissible substitution): Admit: 2018-02-01 | Discharge: 2018-02-02 | Payer: MEDICARE

## 2018-02-01 DIAGNOSIS — R188 Other ascites: Secondary | ICD-10-CM

## 2018-02-01 DIAGNOSIS — K7460 Unspecified cirrhosis of liver: Principal | ICD-10-CM

## 2018-02-01 DIAGNOSIS — R300 Dysuria: Secondary | ICD-10-CM

## 2018-02-01 MED ORDER — CIPROFLOXACIN 500 MG TABLET
ORAL_TABLET | Freq: Two times a day (BID) | ORAL | 0 refills | 0 days | Status: CP
Start: 2018-02-01 — End: 2018-02-11

## 2018-02-08 ENCOUNTER — Institutional Professional Consult (permissible substitution): Admit: 2018-02-08 | Discharge: 2018-02-09 | Payer: MEDICARE

## 2018-02-08 DIAGNOSIS — R188 Other ascites: Secondary | ICD-10-CM

## 2018-02-08 DIAGNOSIS — K7460 Unspecified cirrhosis of liver: Principal | ICD-10-CM

## 2018-02-15 ENCOUNTER — Institutional Professional Consult (permissible substitution): Admit: 2018-02-15 | Discharge: 2018-02-16 | Payer: MEDICARE

## 2018-02-15 DIAGNOSIS — R188 Other ascites: Secondary | ICD-10-CM

## 2018-02-15 DIAGNOSIS — K7460 Unspecified cirrhosis of liver: Principal | ICD-10-CM

## 2018-02-22 ENCOUNTER — Institutional Professional Consult (permissible substitution): Admit: 2018-02-22 | Discharge: 2018-02-22 | Payer: MEDICARE

## 2018-02-22 DIAGNOSIS — R300 Dysuria: Secondary | ICD-10-CM

## 2018-02-22 DIAGNOSIS — K7460 Unspecified cirrhosis of liver: Principal | ICD-10-CM

## 2018-02-22 DIAGNOSIS — R188 Other ascites: Secondary | ICD-10-CM

## 2018-02-24 ENCOUNTER — Other Ambulatory Visit: Payer: Self-pay | Admitting: Family Medicine

## 2018-02-24 DIAGNOSIS — Z780 Asymptomatic menopausal state: Secondary | ICD-10-CM

## 2018-02-24 DIAGNOSIS — Z1231 Encounter for screening mammogram for malignant neoplasm of breast: Secondary | ICD-10-CM

## 2018-03-01 ENCOUNTER — Institutional Professional Consult (permissible substitution): Admit: 2018-03-01 | Discharge: 2018-03-01 | Payer: MEDICARE

## 2018-03-01 DIAGNOSIS — R188 Other ascites: Secondary | ICD-10-CM

## 2018-03-01 DIAGNOSIS — K7460 Unspecified cirrhosis of liver: Principal | ICD-10-CM

## 2018-03-08 ENCOUNTER — Institutional Professional Consult (permissible substitution): Admit: 2018-03-08 | Discharge: 2018-03-09 | Payer: MEDICARE

## 2018-03-08 DIAGNOSIS — K7460 Unspecified cirrhosis of liver: Principal | ICD-10-CM

## 2018-03-08 DIAGNOSIS — R188 Other ascites: Secondary | ICD-10-CM

## 2018-03-15 ENCOUNTER — Institutional Professional Consult (permissible substitution): Admit: 2018-03-15 | Discharge: 2018-03-16 | Payer: MEDICARE

## 2018-03-15 DIAGNOSIS — Z299 Encounter for prophylactic measures, unspecified: Secondary | ICD-10-CM

## 2018-03-15 DIAGNOSIS — D509 Iron deficiency anemia, unspecified: Secondary | ICD-10-CM

## 2018-03-15 DIAGNOSIS — R188 Other ascites: Secondary | ICD-10-CM

## 2018-03-15 DIAGNOSIS — K7460 Unspecified cirrhosis of liver: Principal | ICD-10-CM

## 2018-03-21 ENCOUNTER — Other Ambulatory Visit: Payer: Self-pay

## 2018-03-21 ENCOUNTER — Encounter: Payer: Self-pay | Admitting: Anesthesiology

## 2018-03-21 ENCOUNTER — Ambulatory Visit: Payer: Medicare HMO | Attending: Anesthesiology | Admitting: Anesthesiology

## 2018-03-21 VITALS — BP 122/48 | HR 67 | Temp 98.1°F | Resp 16 | Ht 66.5 in | Wt 260.0 lb

## 2018-03-21 DIAGNOSIS — M5136 Other intervertebral disc degeneration, lumbar region: Secondary | ICD-10-CM | POA: Diagnosis not present

## 2018-03-21 DIAGNOSIS — M51369 Other intervertebral disc degeneration, lumbar region without mention of lumbar back pain or lower extremity pain: Secondary | ICD-10-CM

## 2018-03-21 DIAGNOSIS — M545 Low back pain, unspecified: Secondary | ICD-10-CM

## 2018-03-21 DIAGNOSIS — G894 Chronic pain syndrome: Secondary | ICD-10-CM | POA: Diagnosis present

## 2018-03-21 DIAGNOSIS — F1190 Opioid use, unspecified, uncomplicated: Secondary | ICD-10-CM

## 2018-03-21 DIAGNOSIS — Z79891 Long term (current) use of opiate analgesic: Secondary | ICD-10-CM | POA: Insufficient documentation

## 2018-03-21 DIAGNOSIS — M48062 Spinal stenosis, lumbar region with neurogenic claudication: Secondary | ICD-10-CM | POA: Diagnosis not present

## 2018-03-21 DIAGNOSIS — M542 Cervicalgia: Secondary | ICD-10-CM | POA: Diagnosis not present

## 2018-03-21 DIAGNOSIS — M25511 Pain in right shoulder: Secondary | ICD-10-CM

## 2018-03-21 DIAGNOSIS — M47816 Spondylosis without myelopathy or radiculopathy, lumbar region: Secondary | ICD-10-CM

## 2018-03-21 DIAGNOSIS — M5386 Other specified dorsopathies, lumbar region: Secondary | ICD-10-CM

## 2018-03-21 MED ORDER — OXYCODONE HCL 5 MG PO TABS: 5.0000 mg | ORAL_TABLET | Freq: Three times a day (TID) | ORAL | 0 refills | Status: DC

## 2018-03-21 MED ORDER — OXYCODONE HCL 5 MG PO TABS
5.0000 mg | ORAL_TABLET | Freq: Three times a day (TID) | ORAL | 0 refills | Status: DC
Start: 2018-03-21 — End: 2018-03-21

## 2018-03-21 NOTE — Patient Instructions (Signed)
You have been given 2 Rx for Oxycodone IR to last until 05/20/2018

## 2018-03-21 NOTE — Progress Notes (Signed)
Nursing Pain Medication Assessment:  Safety precautions to be maintained throughout the outpatient stay will include: orient to surroundings, keep bed in low position, maintain call bell within reach at all times, provide assistance with transfer out of bed and ambulation.  Medication Inspection Compliance: Pill count conducted under aseptic conditions, in front of the patient. Neither the pills nor the bottle was removed from the patient's sight at any time. Once count was completed pills were immediately returned to the patient in their original bottle.  Medication: Oxycodone IR Pill/Patch Count: 18 of 90 pills remain Pill/Patch Appearance: Markings consistent with prescribed medication Bottle Appearance: Standard pharmacy container. Clearly labeled. Filled Date: 7 / 24 / 2019 Last Medication intake:  Today

## 2018-03-21 NOTE — Progress Notes (Signed)
Subjective:  Patient ID: Whitney Cline, female    DOB: [birth year]  Age: 72 y.o. MRN: [id number]  CC: Back Pain (lower) and Shoulder Pain (right)   Procedure: None  HPI Whitney Cline presents for evaluation.  She was last seen 2 months ago and is been doing well with her current regimen.  Her medications are well tolerated and based on her narcotic assessment sheet she continues to get good relief of her shoulder and low back pain and chronic diffuse body pain.  She is taking them as prescribed with no evidence of any illicit or diverting use.  The quality characteristic and distribution of her low back pain and neck pain and diffuse body pain have been stable in nature.  No changes in strength are noted.  Outpatient Medications Prior to Visit  Medication Sig Dispense Refill  . ACCU-CHEK AVIVA PLUS test strip USE QID UTD  2  . albuterol (PROVENTIL HFA;VENTOLIN HFA) 108 (90 Base) MCG/ACT inhaler Inhale 2 puffs into the lungs every 6 (six) hours as needed for wheezing or shortness of breath.    Marland Kitchen albuterol (PROVENTIL) (2.5 MG/3ML) 0.083% nebulizer solution VVN Q 6 H PRF WHZ  12  . Blood Glucose Monitoring Suppl (FIFTY50 GLUCOSE METER 2.0) w/Device KIT Use as directed. E11.9    . budesonide-formoterol (SYMBICORT) 160-4.5 MCG/ACT inhaler Inhale 2 puffs into the lungs 2 (two) times daily.    . carisoprodol (SOMA) 350 MG tablet Take 1 tablet (350 mg total) by mouth 4 (four) times daily. 30 tablet 5  . carvedilol (COREG) 3.125 MG tablet Take 3.125 mg by mouth daily.     . cetirizine (ZYRTEC) 10 MG tablet Take 10 mg by mouth daily.    . fluconazole (DIFLUCAN) 100 MG tablet   0  . furosemide (LASIX) 20 MG tablet Take 10 mg by mouth daily.     Marland Kitchen gabapentin (NEURONTIN) 300 MG capsule Take 300 mg by mouth daily at 12 noon.    Marland Kitchen glucose blood test strip USE FOUR TIMES DAILY AS INSTRUCTED    . hydrOXYzine (ATARAX/VISTARIL) 25 MG tablet Take by mouth.    . Insulin Pen Needle (PEN NEEDLES) 32G  X 4 MM MISC Use for injections humalog TID times daily,  then glargine daily accucheck viva machine    . lactulose, encephalopathy, (GENERLAC) 10 GM/15ML SOLN Take 30 g by mouth daily as needed.    . Lancets (ACCU-CHEK MULTICLIX) lancets USE ONCE D AS INSTRUCTED  6  . lidocaine (XYLOCAINE) 5 % ointment Apply topically.    . liraglutide (VICTOZA) 18 MG/3ML SOPN 1.8 mg.    . naloxone (NARCAN) 0.4 MG/ML injection Inject 0.4 mg into the vein as needed.    . Naloxone HCl 0.4 MG/0.4ML SOAJ Inject 1 Units as directed once daily as needed.    . pantoprazole (PROTONIX) 40 MG tablet   1  . rifaximin (XIFAXAN) 550 MG TABS tablet Take 550 mg by mouth 2 (two) times daily.    Marland Kitchen tiotropium (SPIRIVA) 18 MCG inhalation capsule Place 18 mcg into inhaler and inhale daily.    Marland Kitchen oxyCODONE (OXY IR/ROXICODONE) 5 MG immediate release tablet Take 1 tablet (5 mg total) by mouth 3 (three) times daily. 90 tablet 0  . azelastine (ASTELIN) 0.1 % nasal spray Place into the nose.    . cephALEXin (KEFLEX) 250 MG capsule Take by mouth 4 (four) times daily.    . cholecalciferol (VITAMIN D) 400 units TABS tablet Take 400 Units by mouth.    Marland Kitchen  cyclobenzaprine (FLEXERIL) 5 MG tablet TAKE 1 TABLET(5 MG) BY MOUTH THREE TIMES DAILY AS NEEDED FOR MUSCLE SPASMS (Patient not taking: Reported on 08/09/2017) 75 tablet 0  . ferrous sulfate 325 (65 FE) MG EC tablet Take by mouth.    . metFORMIN (GLUCOPHAGE) 500 MG tablet Take 1,000 mg by mouth 2 (two) times daily with a meal.    . methocarbamol (ROBAXIN) 750 MG tablet Take 1 tablet (750 mg total) by mouth 3 (three) times daily. (Patient not taking: Reported on 03/21/2018) 90 tablet 2   Facility-Administered Medications Prior to Visit  Medication Dose Route Frequency Provider Last Rate Last Dose  . dexamethasone (DECADRON) injection 10 mg  10 mg Other Once Molli Barrows, MD      . dexamethasone (DECADRON) injection 4 mg  4 mg Other Once Molli Barrows, MD      . ropivacaine (PF) 2 mg/ml (0.2%)  (NAROPIN) epidural 1 mL  1 mL Epidural Once Molli Barrows, MD      . ropivacaine (PF) 2 mg/mL (0.2%) (NAROPIN) injection 10 mL  10 mL Epidural Once Molli Barrows, MD        Review of Systems CNS: No confusion or sedation Cardiac: No angina or palpitations GI: No abdominal pain or constipation Institution: No nausea vomiting fevers or chills  Objective:  BP (!) 122/48   Pulse 67   Temp 98.1 F (36.7 C)   Resp 16   Ht 5' 6.5" (1.689 m)   Wt 260 lb (117.9 kg)   SpO2 100% Comment: 3l [place]  BMI 41.34 kg/m    BP Readings from Last 3 Encounters:  03/21/18 (!) 122/48  01/19/18 (!) 115/46  11/01/17 123/77     Wt Readings from Last 3 Encounters:  03/21/18 260 lb (117.9 kg)  01/19/18 258 lb (117 kg)  11/01/17 272 lb (123.4 kg)     Physical Exam Pt is alert and oriented PERRL EOMI HEART IS RRR no murmur or rub LCTA no wheezing or rales MUSCULOSKELETAL feels some paraspinous muscle tenderness in the cervical and lumbar region she has chronic oxygen and requires ambulatory assistance.  Labs  Lab Results  Component Value Date   HGBA1C 9.4 (H) 10/29/2012   Lab Results  Component Value Date   CREATININE 1.70 (H) 08/19/2016    -------------------------------------------------------------------------------------------------------------------- Lab Results  Component Value Date   WBC 13.7 (H) 08/19/2016   HGB 6.5 (L) 08/19/2016   HCT 20.4 (L) 08/19/2016   PLT 246 08/19/2016   GLUCOSE 265 (H) 08/19/2016   ALT 19 08/19/2016   AST 47 (H) 08/19/2016   NA 133 (L) 08/19/2016   K 5.5 (H) 08/19/2016   CL 98 (L) 08/19/2016   CREATININE 1.70 (H) 08/19/2016   BUN 97 (H) 08/19/2016   CO2 22 08/19/2016   INR 1.1 10/28/2012   HGBA1C 9.4 (H) 10/29/2012    --------------------------------------------------------------------------------------------------------------------- Dg Chest 2 View  Result Date: 08/19/2016 CLINICAL DATA:  Dyspnea and wheezing x2 days wound EXAM: CHEST   2 VIEW COMPARISON:  Chest CT 08/12/2007, CXR 07/28/2013 0 FINDINGS: Unchanged right lower lobe well-circumscribed 3.3 cm mass dating back to 2009 consistent with a benign finding. Mild interstitial prominence noted bilaterally which may reflect bronchitic change. No alveolar consolidation, effusion or pneumothorax. Heart is top-normal in size. There is aortic atherosclerosis without aneurysm. IMPRESSION: Stable right lower lobe 3.3 cm mass dating back to 2009 consistent with a benign finding. Mild interstitial prominence bilaterally which may reflect bronchitic change. No pulmonary consolidation.  Aortic atherosclerosis. Electronically Signed   By: Ashley Royalty M.D.   On: 08/19/2016 14:20     Assessment & Plan:   Brent was seen today for back pain and shoulder pain.  Diagnoses and all orders for this visit:  Chronic pain syndrome  Low back pain at multiple sites  Cervicalgia  Pain in joint of right shoulder  DDD (degenerative disc disease), lumbar  Chronic, continuous use of opioids  Facet arthritis of lumbar region  Spinal stenosis, lumbar region, with neurogenic claudication  Low back derangement syndrome  Other orders -     Discontinue: oxyCODONE (OXY IR/ROXICODONE) 5 MG immediate release tablet; Take 1 tablet (5 mg total) by mouth 3 (three) times daily. -     oxyCODONE (OXY IR/ROXICODONE) 5 MG immediate release tablet; Take 1 tablet (5 mg total) by mouth 3 (three) times daily.        ----------------------------------------------------------------------------------------------------------------------  Problem List Items Addressed This Visit    None    Visit Diagnoses    Chronic pain syndrome    -  Primary   Low back pain at multiple sites       Relevant Medications   oxyCODONE (OXY IR/ROXICODONE) 5 MG immediate release tablet   Cervicalgia       Pain in joint of right shoulder       DDD (degenerative disc disease), lumbar       Relevant Medications   oxyCODONE (OXY  IR/ROXICODONE) 5 MG immediate release tablet   Chronic, continuous use of opioids       Facet arthritis of lumbar region       Relevant Medications   oxyCODONE (OXY IR/ROXICODONE) 5 MG immediate release tablet   Spinal stenosis, lumbar region, with neurogenic claudication       Relevant Medications   oxyCODONE (OXY IR/ROXICODONE) 5 MG immediate release tablet   Low back derangement syndrome       Relevant Medications   oxyCODONE (OXY IR/ROXICODONE) 5 MG immediate release tablet        ----------------------------------------------------------------------------------------------------------------------  1. Chronic pain syndrome We will have her continue with her current regimen as she is doing well with this based on her narcotic assessment sheet.  We have also reviewed the [facility] practitioner database information and it is appropriate.  Refills will be given for August 20 and September 19.  Have her return to clinic in 2 months  2. Low back pain at multiple sites As above  3. Cervicalgia   4. Pain in joint of right shoulder   5. DDD (degenerative disc disease), lumbar   6. Chronic, continuous use of opioids   7. Facet arthritis of lumbar region   8. Spinal stenosis, lumbar region, with neurogenic claudication   9. Low back derangement syndrome     ----------------------------------------------------------------------------------------------------------------------  I am having Whitney Cline maintain her albuterol, budesonide-formoterol, carvedilol, cetirizine, cholecalciferol, furosemide, gabapentin, metFORMIN, naloxone, tiotropium, glucose blood, albuterol, azelastine, FIFTY50 GLUCOSE METER 2.0, ACCU-CHEK AVIVA PLUS, hydrOXYzine, lidocaine, Pen Needles, accu-chek multiclix, ferrous sulfate, Naloxone HCl, liraglutide, carisoprodol, lactulose (encephalopathy), rifaximin, cephALEXin, cyclobenzaprine, fluconazole, pantoprazole, methocarbamol, and oxyCODONE. We  will continue to administer dexamethasone, ropivacaine (PF) 2 mg/mL (0.2%), dexamethasone, and ropivacaine (PF) 2 mg/mL (0.2%).   Meds ordered this encounter  Medications  . DISCONTD: oxyCODONE (OXY IR/ROXICODONE) 5 MG immediate release tablet    Sig: Take 1 tablet (5 mg total) by mouth 3 (three) times daily.    Dispense:  90 tablet    Refill:  0  Do not fill until [id number]  . oxyCODONE (OXY IR/ROXICODONE) 5 MG immediate release tablet    Sig: Take 1 tablet (5 mg total) by mouth 3 (three) times daily.    Dispense:  90 tablet    Refill:  0    Do not fill until [id number]   Patient's Medications  New Prescriptions   No medications on file  Previous Medications   ACCU-CHEK AVIVA PLUS TEST STRIP    USE QID UTD   ALBUTEROL (PROVENTIL HFA;VENTOLIN HFA) 108 (90 BASE) MCG/ACT INHALER    Inhale 2 puffs into the lungs every 6 (six) hours as needed for wheezing or shortness of breath.   ALBUTEROL (PROVENTIL) (2.5 MG/3ML) 0.083% NEBULIZER SOLUTION    VVN Q 6 H PRF WHZ   AZELASTINE (ASTELIN) 0.1 % NASAL SPRAY    Place into the nose.   BLOOD GLUCOSE MONITORING SUPPL (FIFTY50 GLUCOSE METER 2.0) W/DEVICE KIT    Use as directed. E11.9   BUDESONIDE-FORMOTEROL (SYMBICORT) 160-4.5 MCG/ACT INHALER    Inhale 2 puffs into the lungs 2 (two) times daily.   CARISOPRODOL (SOMA) 350 MG TABLET    Take 1 tablet (350 mg total) by mouth 4 (four) times daily.   CARVEDILOL (COREG) 3.125 MG TABLET    Take 3.125 mg by mouth daily.    CEPHALEXIN (KEFLEX) 250 MG CAPSULE    Take by mouth 4 (four) times daily.   CETIRIZINE (ZYRTEC) 10 MG TABLET    Take 10 mg by mouth daily.   CHOLECALCIFEROL (VITAMIN D) 400 UNITS TABS TABLET    Take 400 Units by mouth.   CYCLOBENZAPRINE (FLEXERIL) 5 MG TABLET    TAKE 1 TABLET(5 MG) BY MOUTH THREE TIMES DAILY AS NEEDED FOR MUSCLE SPASMS   FERROUS SULFATE 325 (65 FE) MG EC TABLET    Take by mouth.   FLUCONAZOLE (DIFLUCAN) 100 MG TABLET       FUROSEMIDE (LASIX) 20 MG TABLET    Take 10 mg by  mouth daily.    GABAPENTIN (NEURONTIN) 300 MG CAPSULE    Take 300 mg by mouth daily at 12 noon.   GLUCOSE BLOOD TEST STRIP    USE FOUR TIMES DAILY AS INSTRUCTED   HYDROXYZINE (ATARAX/VISTARIL) 25 MG TABLET    Take by mouth.   INSULIN PEN NEEDLE (PEN NEEDLES) 32G X 4 MM MISC    Use for injections humalog TID times daily,  then glargine daily accucheck viva machine   LACTULOSE, ENCEPHALOPATHY, (GENERLAC) 10 GM/15ML SOLN    Take 30 g by mouth daily as needed.   LANCETS (ACCU-CHEK MULTICLIX) LANCETS    USE ONCE D AS INSTRUCTED   LIDOCAINE (XYLOCAINE) 5 % OINTMENT    Apply topically.   LIRAGLUTIDE (VICTOZA) 18 MG/3ML SOPN    1.8 mg.   METFORMIN (GLUCOPHAGE) 500 MG TABLET    Take 1,000 mg by mouth 2 (two) times daily with a meal.   METHOCARBAMOL (ROBAXIN) 750 MG TABLET    Take 1 tablet (750 mg total) by mouth 3 (three) times daily.   NALOXONE (NARCAN) 0.4 MG/ML INJECTION    Inject 0.4 mg into the vein as needed.   NALOXONE HCL 0.4 MG/0.4ML SOAJ    Inject 1 Units as directed once daily as needed.   PANTOPRAZOLE (PROTONIX) 40 MG TABLET       RIFAXIMIN (XIFAXAN) 550 MG TABS TABLET    Take 550 mg by mouth 2 (two) times daily.   TIOTROPIUM (SPIRIVA) [place]  18 mcg into inhaler and inhale daily.  Modified Medications   Modified Medication Previous Medication   OXYCODONE (OXY IR/ROXICODONE) 5 MG IMMEDIATE RELEASE TABLET oxyCODONE (OXY IR/ROXICODONE) 5 MG immediate release tablet      Take 1 tablet (5 mg total) by mouth 3 (three) times daily.    Take 1 tablet (5 mg total) by mouth 3 (three) times daily.  Discontinued Medications   No medications on file   ----------------------------------------------------------------------------------------------------------------------  Follow-up: Return in about 2 months (around 05/21/2018) for evaluation, med refill.    Molli Barrows, MD

## 2018-03-22 ENCOUNTER — Institutional Professional Consult (permissible substitution): Admit: 2018-03-22 | Discharge: 2018-03-22 | Payer: MEDICARE

## 2018-03-22 DIAGNOSIS — K7460 Unspecified cirrhosis of liver: Principal | ICD-10-CM

## 2018-03-22 DIAGNOSIS — R188 Other ascites: Secondary | ICD-10-CM

## 2018-03-22 DIAGNOSIS — D649 Anemia, unspecified: Secondary | ICD-10-CM

## 2018-03-29 ENCOUNTER — Institutional Professional Consult (permissible substitution): Admit: 2018-03-29 | Discharge: 2018-03-30 | Payer: MEDICARE

## 2018-03-29 DIAGNOSIS — K7460 Unspecified cirrhosis of liver: Principal | ICD-10-CM

## 2018-03-29 DIAGNOSIS — R188 Other ascites: Secondary | ICD-10-CM

## 2018-04-05 ENCOUNTER — Institutional Professional Consult (permissible substitution): Admit: 2018-04-05 | Discharge: 2018-04-06 | Payer: MEDICARE

## 2018-04-05 DIAGNOSIS — K7460 Unspecified cirrhosis of liver: Principal | ICD-10-CM

## 2018-04-05 DIAGNOSIS — D649 Anemia, unspecified: Secondary | ICD-10-CM

## 2018-04-12 ENCOUNTER — Institutional Professional Consult (permissible substitution): Admit: 2018-04-12 | Discharge: 2018-04-13 | Payer: MEDICARE

## 2018-04-12 DIAGNOSIS — R188 Other ascites: Secondary | ICD-10-CM

## 2018-04-12 DIAGNOSIS — K7460 Unspecified cirrhosis of liver: Principal | ICD-10-CM

## 2018-04-13 MED ORDER — ESCITALOPRAM 5 MG TABLET
ORAL_TABLET | Freq: Every day | ORAL | 2 refills | 0 days | Status: CP
Start: 2018-04-13 — End: 2018-07-09

## 2018-04-19 ENCOUNTER — Institutional Professional Consult (permissible substitution): Admit: 2018-04-19 | Discharge: 2018-04-20 | Payer: MEDICARE

## 2018-04-19 DIAGNOSIS — R188 Other ascites: Secondary | ICD-10-CM

## 2018-04-19 DIAGNOSIS — K7460 Unspecified cirrhosis of liver: Principal | ICD-10-CM

## 2018-04-26 ENCOUNTER — Institutional Professional Consult (permissible substitution): Admit: 2018-04-26 | Discharge: 2018-04-27 | Payer: MEDICARE

## 2018-04-26 DIAGNOSIS — K7460 Unspecified cirrhosis of liver: Principal | ICD-10-CM

## 2018-04-26 DIAGNOSIS — R350 Frequency of micturition: Secondary | ICD-10-CM

## 2018-04-26 DIAGNOSIS — R188 Other ascites: Secondary | ICD-10-CM

## 2018-04-26 MED ORDER — CIPROFLOXACIN 250 MG TABLET
ORAL_TABLET | Freq: Two times a day (BID) | ORAL | 0 refills | 0 days | Status: CP
Start: 2018-04-26 — End: 2018-05-06

## 2018-04-27 ENCOUNTER — Emergency Department: Admit: 2018-04-27 | Discharge: 2018-04-28 | Disposition: A | Discharge status: Home / Self Care | Payer: MEDICARE

## 2018-04-27 ENCOUNTER — Ambulatory Visit: Admit: 2018-04-27 | Discharge: 2018-04-28 | Disposition: A | Discharge status: Home / Self Care | Payer: MEDICARE

## 2018-04-27 DIAGNOSIS — R200 Anesthesia of skin: Principal | ICD-10-CM

## 2018-05-03 ENCOUNTER — Institutional Professional Consult (permissible substitution): Admit: 2018-05-03 | Discharge: 2018-05-04 | Payer: MEDICARE

## 2018-05-03 DIAGNOSIS — R188 Other ascites: Secondary | ICD-10-CM

## 2018-05-03 DIAGNOSIS — K7460 Unspecified cirrhosis of liver: Principal | ICD-10-CM

## 2018-05-10 ENCOUNTER — Ambulatory Visit: Admit: 2018-05-10 | Discharge: 2018-05-11 | Payer: MEDICARE

## 2018-05-10 DIAGNOSIS — K7460 Unspecified cirrhosis of liver: Principal | ICD-10-CM

## 2018-05-10 DIAGNOSIS — R300 Dysuria: Secondary | ICD-10-CM

## 2018-05-10 DIAGNOSIS — R188 Other ascites: Secondary | ICD-10-CM

## 2018-05-16 ENCOUNTER — Encounter: Payer: Self-pay | Admitting: Anesthesiology

## 2018-05-16 ENCOUNTER — Ambulatory Visit: Payer: Medicare HMO | Attending: Anesthesiology | Admitting: Anesthesiology

## 2018-05-16 ENCOUNTER — Other Ambulatory Visit: Payer: Self-pay

## 2018-05-16 VITALS — BP 122/55 | HR 60 | Temp 98.0°F | Resp 18 | Ht 66.0 in | Wt 260.0 lb

## 2018-05-16 DIAGNOSIS — F1190 Opioid use, unspecified, uncomplicated: Secondary | ICD-10-CM

## 2018-05-16 DIAGNOSIS — M5136 Other intervertebral disc degeneration, lumbar region: Secondary | ICD-10-CM | POA: Insufficient documentation

## 2018-05-16 DIAGNOSIS — Z79899 Other long term (current) drug therapy: Secondary | ICD-10-CM | POA: Diagnosis not present

## 2018-05-16 DIAGNOSIS — M25511 Pain in right shoulder: Secondary | ICD-10-CM | POA: Diagnosis not present

## 2018-05-16 DIAGNOSIS — M4686 Other specified inflammatory spondylopathies, lumbar region: Secondary | ICD-10-CM | POA: Diagnosis not present

## 2018-05-16 DIAGNOSIS — Z7951 Long term (current) use of inhaled steroids: Secondary | ICD-10-CM | POA: Diagnosis not present

## 2018-05-16 DIAGNOSIS — Z794 Long term (current) use of insulin: Secondary | ICD-10-CM | POA: Diagnosis not present

## 2018-05-16 DIAGNOSIS — Z79891 Long term (current) use of opiate analgesic: Secondary | ICD-10-CM | POA: Insufficient documentation

## 2018-05-16 DIAGNOSIS — M545 Low back pain, unspecified: Secondary | ICD-10-CM

## 2018-05-16 DIAGNOSIS — G894 Chronic pain syndrome: Secondary | ICD-10-CM | POA: Insufficient documentation

## 2018-05-16 DIAGNOSIS — M542 Cervicalgia: Secondary | ICD-10-CM | POA: Insufficient documentation

## 2018-05-16 DIAGNOSIS — M5386 Other specified dorsopathies, lumbar region: Secondary | ICD-10-CM | POA: Diagnosis not present

## 2018-05-16 DIAGNOSIS — M48062 Spinal stenosis, lumbar region with neurogenic claudication: Secondary | ICD-10-CM | POA: Diagnosis not present

## 2018-05-16 DIAGNOSIS — M47816 Spondylosis without myelopathy or radiculopathy, lumbar region: Secondary | ICD-10-CM

## 2018-05-16 MED ORDER — OXYCODONE HCL 5 MG PO TABS: 5.0000 mg | ORAL_TABLET | Freq: Three times a day (TID) | ORAL | 0 refills | Status: DC

## 2018-05-16 NOTE — Progress Notes (Signed)
Subjective:  Patient ID: Whitney Cline, female    DOB: 10/04/1945  Age: 72 y.o. MRN: [id number]  CC: Back Pain (low)   Procedure: None  HPI Whitney Cline presents for evaluation.  Whitney Cline was last seen 2 months ago and has been doing well with her current regimen.  She still continues to have diffuse body pain primarily affecting the shoulders neck low back hips and lower extremities.  In the past she has had previous injection therapy for this but her made her feel crazy.  She is been taking her medications as prescribed and these work well for her with no side effects.  She gets good relief from the medications and is able to sleep better at night as well.  She has less pain during the day.  No change in lower extremity strength or function or bowel or bladder function is noted.  Outpatient Medications Prior to Visit  Medication Sig Dispense Refill  . ACCU-CHEK AVIVA PLUS test strip USE QID UTD  2  . albuterol (PROVENTIL HFA;VENTOLIN HFA) 108 (90 Base) MCG/ACT inhaler Inhale 2 puffs into the lungs every 6 (six) hours as needed for wheezing or shortness of breath.    Marland Kitchen albuterol (PROVENTIL) (2.5 MG/3ML) 0.083% nebulizer solution VVN Q 6 H PRF WHZ  12  . azelastine (ASTELIN) 0.1 % nasal spray Place into the nose.    . Blood Glucose Monitoring Suppl (FIFTY50 GLUCOSE METER 2.0) w/Device KIT Use as directed. E11.9    . budesonide-formoterol (SYMBICORT) 160-4.5 MCG/ACT inhaler Inhale 2 puffs into the lungs 2 (two) times daily.    . carvedilol (COREG) 3.125 MG tablet Take 3.125 mg by mouth daily.     . cetirizine (ZYRTEC) 10 MG tablet Take 10 mg by mouth daily.    . cholecalciferol (VITAMIN D) 400 units TABS tablet Take 400 Units by mouth.    . ferrous sulfate 325 (65 FE) MG EC tablet Take by mouth.    . furosemide (LASIX) 20 MG tablet Take 10 mg by mouth daily.     Marland Kitchen gabapentin (NEURONTIN) 300 MG capsule Take 300 mg by mouth daily at 12 noon.    Marland Kitchen glucose blood test strip USE FOUR  TIMES DAILY AS INSTRUCTED    . hydrOXYzine (ATARAX/VISTARIL) 25 MG tablet Take by mouth.    . Insulin Pen Needle (PEN NEEDLES) 32G X 4 MM MISC Use for injections humalog TID times daily,  then glargine daily accucheck viva machine    . lactulose, encephalopathy, (GENERLAC) 10 GM/15ML SOLN Take 30 g by mouth daily as needed.    . Lancets (ACCU-CHEK MULTICLIX) lancets USE ONCE D AS INSTRUCTED  6  . lidocaine (XYLOCAINE) 5 % ointment Apply topically.    . liraglutide (VICTOZA) 18 MG/3ML SOPN 1.8 mg.    . naloxone (NARCAN) 0.4 MG/ML injection Inject 0.4 mg into the vein as needed.    . pantoprazole (PROTONIX) 40 MG tablet   1  . rifaximin (XIFAXAN) 550 MG TABS tablet Take 550 mg by mouth 2 (two) times daily.    Marland Kitchen tiotropium (SPIRIVA) 18 MCG inhalation capsule Place 18 mcg into inhaler and inhale daily.    Marland Kitchen oxyCODONE (OXY IR/ROXICODONE) 5 MG immediate release tablet Take 1 tablet (5 mg total) by mouth 3 (three) times daily. 90 tablet 0  . carisoprodol (SOMA) 350 MG tablet Take 1 tablet (350 mg total) by mouth 4 (four) times daily. (Patient not taking: Reported on 05/16/2018) 30 tablet 5  . cephALEXin (KEFLEX)  250 MG capsule Take by mouth 4 (four) times daily.    . cyclobenzaprine (FLEXERIL) 5 MG tablet TAKE 1 TABLET(5 MG) BY MOUTH THREE TIMES DAILY AS NEEDED FOR MUSCLE SPASMS (Patient not taking: Reported on 08/09/2017) 75 tablet 0  . fluconazole (DIFLUCAN) 100 MG tablet   0  . metFORMIN (GLUCOPHAGE) 500 MG tablet Take 1,000 mg by mouth 2 (two) times daily with a meal.    . methocarbamol (ROBAXIN) 750 MG tablet Take 1 tablet (750 mg total) by mouth 3 (three) times daily. (Patient not taking: Reported on 03/21/2018) 90 tablet 2  . Naloxone HCl 0.4 MG/0.4ML SOAJ Inject 1 Units as directed once daily as needed.     Facility-Administered Medications Prior to Visit  Medication Dose Route Frequency Provider Last Rate Last Dose  . dexamethasone (DECADRON) injection 10 mg  10 mg Other Once Molli Barrows, MD       . dexamethasone (DECADRON) injection 4 mg  4 mg Other Once Molli Barrows, MD      . ropivacaine (PF) 2 mg/ml (0.2%) (NAROPIN) epidural 1 mL  1 mL Epidural Once Molli Barrows, MD      . ropivacaine (PF) 2 mg/mL (0.2%) (NAROPIN) injection 10 mL  10 mL Epidural Once Molli Barrows, MD        Review of Systems CNS: No confusion or sedation Cardiac: No angina or palpitations GI: No abdominal pain or constipation Constitutional: No nausea vomiting fevers or chills  Objective:  BP (!) 122/55   Pulse 60   Temp 98 F (36.7 C)   Resp 18   Ht '5\' 6"'$  (1.676 m)   Wt 260 lb (117.9 kg)   SpO2 100%   BMI 41.97 kg/m    BP Readings from Last 3 Encounters:  05/16/18 (!) 122/55  03/21/18 (!) 122/48  01/19/18 (!) 115/46     Wt Readings from Last 3 Encounters:  05/16/18 260 lb (117.9 kg)  03/21/18 260 lb (117.9 kg)  01/19/18 258 lb (117 kg)     Physical Exam Pt is alert and oriented PERRL EOMI HEART IS RRR no murmur or rub LCTA no wheezing or rales MUSCULOSKELETAL reveals some paraspinous muscle tenderness in the neck and low back but no overt trigger points or muscle tone and bulk is at baseline and she is using a wheelchair for ambulation today  Labs  Lab Results  Component Value Date   HGBA1C 9.4 (H) 10/29/2012   Lab Results  Component Value Date   CREATININE 1.70 (H) 08/19/2016    -------------------------------------------------------------------------------------------------------------------- Lab Results  Component Value Date   WBC 13.7 (H) 08/19/2016   HGB 6.5 (L) 08/19/2016   HCT 20.4 (L) 08/19/2016   PLT 246 08/19/2016   GLUCOSE 265 (H) 08/19/2016   ALT 19 08/19/2016   AST 47 (H) 08/19/2016   NA 133 (L) 08/19/2016   K 5.5 (H) 08/19/2016   CL 98 (L) 08/19/2016   CREATININE 1.70 (H) 08/19/2016   BUN 97 (H) 08/19/2016   CO2 22 08/19/2016   INR 1.1 10/28/2012   HGBA1C 9.4 (H) 10/29/2012     --------------------------------------------------------------------------------------------------------------------- Dg Chest 2 View  Result Date: 08/19/2016 CLINICAL DATA:  Dyspnea and wheezing x2 days wound EXAM: CHEST  2 VIEW COMPARISON:  Chest CT 08/12/2007, CXR 07/28/2013 0 FINDINGS: Unchanged right lower lobe well-circumscribed 3.3 cm mass dating back to 2009 consistent with a benign finding. Mild interstitial prominence noted bilaterally which may reflect bronchitic change. No alveolar consolidation, effusion or pneumothorax.  Heart is top-normal in size. There is aortic atherosclerosis without aneurysm. IMPRESSION: Stable right lower lobe 3.3 cm mass dating back to 2009 consistent with a benign finding. Mild interstitial prominence bilaterally which may reflect bronchitic change. No pulmonary consolidation. Aortic atherosclerosis. Electronically Signed   By: Ashley Royalty M.D.   On: 08/19/2016 14:20     Assessment & Plan:   Mykenzi was seen today for back pain.  Diagnoses and all orders for this visit:  Chronic pain syndrome  Low back pain at multiple sites  Cervicalgia  Pain in joint of right shoulder  DDD (degenerative disc disease), lumbar  Chronic, continuous use of opioids  Facet arthritis of lumbar region  Spinal stenosis, lumbar region, with neurogenic claudication  Low back derangement syndrome  Other orders -     Discontinue: oxyCODONE (OXY IR/ROXICODONE) 5 MG immediate release tablet; Take 1 tablet (5 mg total) by mouth 3 (three) times daily. -     oxyCODONE (OXY IR/ROXICODONE) 5 MG immediate release tablet; Take 1 tablet (5 mg total) by mouth 3 (three) times daily.        ----------------------------------------------------------------------------------------------------------------------  Problem List Items Addressed This Visit    None    Visit Diagnoses    Chronic pain syndrome    -  Primary   Low back pain at multiple sites       Relevant  Medications   oxyCODONE (OXY IR/ROXICODONE) 5 MG immediate release tablet   Cervicalgia       Pain in joint of right shoulder       DDD (degenerative disc disease), lumbar       Relevant Medications   oxyCODONE (OXY IR/ROXICODONE) 5 MG immediate release tablet   Chronic, continuous use of opioids       Facet arthritis of lumbar region       Relevant Medications   oxyCODONE (OXY IR/ROXICODONE) 5 MG immediate release tablet   Spinal stenosis, lumbar region, with neurogenic claudication       Relevant Medications   oxyCODONE (OXY IR/ROXICODONE) 5 MG immediate release tablet   Low back derangement syndrome       Relevant Medications   oxyCODONE (OXY IR/ROXICODONE) 5 MG immediate release tablet        ----------------------------------------------------------------------------------------------------------------------  1. Chronic pain syndrome We will proceed with refills on her medications for October 19 and November 18.  She is doing well with the medication regimen and will continue at her current dosing.  We will have her return to clinic in 2 months for reevaluation.  2. Low back pain at multiple sites As above continue with stretching exercises as reviewed as well today.  3. Cervicalgia   4. Pain in joint of right shoulder   5. DDD (degenerative disc disease), lumbar   6. Chronic, continuous use of opioids Have reviewed the [facility] practitioner database information and it is appropriate.  7. Facet arthritis of lumbar region   8. Spinal stenosis, lumbar region, with neurogenic claudication   9. Low back derangement syndrome     ----------------------------------------------------------------------------------------------------------------------  I am having Whitney Cline maintain her albuterol, budesonide-formoterol, carvedilol, cetirizine, cholecalciferol, furosemide, gabapentin, metFORMIN, naloxone, tiotropium, glucose blood, albuterol, azelastine,  FIFTY50 GLUCOSE METER 2.0, ACCU-CHEK AVIVA PLUS, hydrOXYzine, lidocaine, Pen Needles, accu-chek multiclix, ferrous sulfate, Naloxone HCl, liraglutide, carisoprodol, lactulose (encephalopathy), rifaximin, cephALEXin, cyclobenzaprine, fluconazole, pantoprazole, methocarbamol, and oxyCODONE. We will continue to administer dexamethasone, ropivacaine (PF) 2 mg/mL (0.2%), dexamethasone, and ropivacaine (PF) 2 mg/mL (0.2%).   Meds ordered this encounter  Medications  . DISCONTD: oxyCODONE (OXY IR/ROXICODONE) 5 MG immediate release tablet    Sig: Take 1 tablet (5 mg total) by mouth 3 (three) times daily.    Dispense:  90 tablet    Refill:  0    Do not fill until [id number]  . oxyCODONE (OXY IR/ROXICODONE) 5 MG immediate release tablet    Sig: Take 1 tablet (5 mg total) by mouth 3 (three) times daily.    Dispense:  90 tablet    Refill:  0    Do not fill until [id number]   Patient's Medications  New Prescriptions   No medications on file  Previous Medications   ACCU-CHEK AVIVA PLUS TEST STRIP    USE QID UTD   ALBUTEROL (PROVENTIL HFA;VENTOLIN HFA) 108 (90 BASE) MCG/ACT INHALER    Inhale 2 puffs into the lungs every 6 (six) hours as needed for wheezing or shortness of breath.   ALBUTEROL (PROVENTIL) (2.5 MG/3ML) 0.083% NEBULIZER SOLUTION    VVN Q 6 H PRF WHZ   AZELASTINE (ASTELIN) 0.1 % NASAL SPRAY    Place into the nose.   BLOOD GLUCOSE MONITORING SUPPL (FIFTY50 GLUCOSE METER 2.0) W/DEVICE KIT    Use as directed. E11.9   BUDESONIDE-FORMOTEROL (SYMBICORT) 160-4.5 MCG/ACT INHALER    Inhale 2 puffs into the lungs 2 (two) times daily.   CARISOPRODOL (SOMA) 350 MG TABLET    Take 1 tablet (350 mg total) by mouth 4 (four) times daily.   CARVEDILOL (COREG) 3.125 MG TABLET    Take 3.125 mg by mouth daily.    CEPHALEXIN (KEFLEX) 250 MG CAPSULE    Take by mouth 4 (four) times daily.   CETIRIZINE (ZYRTEC) 10 MG TABLET    Take 10 mg by mouth daily.   CHOLECALCIFEROL (VITAMIN D) 400 UNITS TABS TABLET    Take 400  Units by mouth.   CYCLOBENZAPRINE (FLEXERIL) 5 MG TABLET    TAKE 1 TABLET(5 MG) BY MOUTH THREE TIMES DAILY AS NEEDED FOR MUSCLE SPASMS   FERROUS SULFATE 325 (65 FE) MG EC TABLET    Take by mouth.   FLUCONAZOLE (DIFLUCAN) 100 MG TABLET       FUROSEMIDE (LASIX) 20 MG TABLET    Take 10 mg by mouth daily.    GABAPENTIN (NEURONTIN) 300 MG CAPSULE    Take 300 mg by mouth daily at 12 noon.   GLUCOSE BLOOD TEST STRIP    USE FOUR TIMES DAILY AS INSTRUCTED   HYDROXYZINE (ATARAX/VISTARIL) 25 MG TABLET    Take by mouth.   INSULIN PEN NEEDLE (PEN NEEDLES) 32G X 4 MM MISC    Use for injections humalog TID times daily,  then glargine daily accucheck viva machine   LACTULOSE, ENCEPHALOPATHY, (GENERLAC) 10 GM/15ML SOLN    Take 30 g by mouth daily as needed.   LANCETS (ACCU-CHEK MULTICLIX) LANCETS    USE ONCE D AS INSTRUCTED   LIDOCAINE (XYLOCAINE) 5 % OINTMENT    Apply topically.   LIRAGLUTIDE (VICTOZA) 18 MG/3ML SOPN    1.8 mg.   METFORMIN (GLUCOPHAGE) 500 MG TABLET    Take 1,000 mg by mouth 2 (two) times daily with a meal.   METHOCARBAMOL (ROBAXIN) 750 MG TABLET    Take 1 tablet (750 mg total) by mouth 3 (three) times daily.   NALOXONE (NARCAN) 0.4 MG/ML INJECTION    Inject 0.4 mg into the vein as needed.   NALOXONE HCL 0.4 MG/0.4ML SOAJ    Inject 1 Units as directed once daily as  needed.   PANTOPRAZOLE (PROTONIX) 40 MG TABLET       RIFAXIMIN (XIFAXAN) 550 MG TABS TABLET    Take 550 mg by mouth 2 (two) times daily.   TIOTROPIUM (SPIRIVA) 18 MCG INHALATION CAPSULE    Place 18 mcg into inhaler and inhale daily.  Modified Medications   Modified Medication Previous Medication   OXYCODONE (OXY IR/ROXICODONE) 5 MG IMMEDIATE RELEASE TABLET oxyCODONE (OXY IR/ROXICODONE) 5 MG immediate release tablet      Take 1 tablet (5 mg total) by mouth 3 (three) times daily.    Take 1 tablet (5 mg total) by mouth 3 (three) times daily.  Discontinued Medications   No medications on file    ----------------------------------------------------------------------------------------------------------------------  Follow-up: Return in about 2 months (around 07/16/2018) for evaluation, med refill.    Molli Barrows, MD

## 2018-05-16 NOTE — Patient Instructions (Signed)
You have been given 2 scripts for oxycodone today tlu 07-19-18

## 2018-05-16 NOTE — Progress Notes (Signed)
Nursing Pain Medication Assessment:  Safety precautions to be maintained throughout the outpatient stay will include: orient to surroundings, keep bed in low position, maintain call bell within reach at all times, provide assistance with transfer out of bed and ambulation.  Medication Inspection Compliance: Pill count conducted under aseptic conditions, in front of the patient. Neither the pills nor the bottle was removed from the patient's sight at any time. Once count was completed pills were immediately returned to the patient in their original bottle.  Medication: Oxycodone IR Pill/Patch Count: 32 of 90 pills remain Pill/Patch Appearance: Markings consistent with prescribed medication Bottle Appearance: Standard pharmacy container. Clearly labeled. Filled Date:09 /23 / 2019 Last Medication intake:  Today

## 2018-05-17 ENCOUNTER — Ambulatory Visit: Admit: 2018-05-17 | Discharge: 2018-05-18 | Payer: MEDICARE

## 2018-05-17 DIAGNOSIS — R188 Other ascites: Secondary | ICD-10-CM

## 2018-05-17 DIAGNOSIS — K7460 Unspecified cirrhosis of liver: Principal | ICD-10-CM

## 2018-05-17 MED ORDER — DOXEPIN 10 MG CAPSULE
ORAL_CAPSULE | Freq: Every evening | ORAL | 11 refills | 0 days | Status: CP
Start: 2018-05-17 — End: 2018-06-07

## 2018-05-24 ENCOUNTER — Ambulatory Visit: Admit: 2018-05-24 | Discharge: 2018-05-25 | Payer: MEDICARE

## 2018-05-24 DIAGNOSIS — R188 Other ascites: Secondary | ICD-10-CM

## 2018-05-24 DIAGNOSIS — K7460 Unspecified cirrhosis of liver: Principal | ICD-10-CM

## 2018-05-31 ENCOUNTER — Ambulatory Visit: Admit: 2018-05-31 | Discharge: 2018-06-01 | Payer: MEDICARE

## 2018-05-31 DIAGNOSIS — K7460 Unspecified cirrhosis of liver: Principal | ICD-10-CM

## 2018-05-31 DIAGNOSIS — R188 Other ascites: Secondary | ICD-10-CM

## 2018-06-07 ENCOUNTER — Ambulatory Visit: Admit: 2018-06-07 | Discharge: 2018-06-07 | Payer: MEDICARE

## 2018-06-07 DIAGNOSIS — L299 Pruritus, unspecified: Secondary | ICD-10-CM

## 2018-06-07 DIAGNOSIS — R188 Other ascites: Secondary | ICD-10-CM

## 2018-06-07 DIAGNOSIS — K7460 Unspecified cirrhosis of liver: Principal | ICD-10-CM

## 2018-06-07 DIAGNOSIS — K068 Other specified disorders of gingiva and edentulous alveolar ridge: Secondary | ICD-10-CM

## 2018-06-07 MED ORDER — DESIPRAMINE 10 MG TABLET
ORAL_TABLET | Freq: Every evening | ORAL | 11 refills | 0.00000 days | Status: CP
Start: 2018-06-07 — End: ?

## 2018-06-07 MED ORDER — CHLORHEXIDINE GLUCONATE 0.12 % MOUTHWASH
Freq: Two times a day (BID) | OROMUCOSAL | 0 refills | 0.00000 days | Status: CP
Start: 2018-06-07 — End: ?

## 2018-06-14 ENCOUNTER — Ambulatory Visit: Admit: 2018-06-14 | Discharge: 2018-06-15 | Payer: MEDICARE

## 2018-06-14 DIAGNOSIS — K7460 Unspecified cirrhosis of liver: Principal | ICD-10-CM

## 2018-06-14 DIAGNOSIS — R188 Other ascites: Secondary | ICD-10-CM

## 2018-06-21 ENCOUNTER — Ambulatory Visit: Admit: 2018-06-21 | Discharge: 2018-06-22 | Payer: MEDICARE

## 2018-06-21 DIAGNOSIS — K7460 Unspecified cirrhosis of liver: Principal | ICD-10-CM

## 2018-06-21 DIAGNOSIS — C220 Liver cell carcinoma: Secondary | ICD-10-CM

## 2018-06-21 DIAGNOSIS — R188 Other ascites: Secondary | ICD-10-CM

## 2018-06-28 ENCOUNTER — Ambulatory Visit: Admit: 2018-06-28 | Discharge: 2018-06-29 | Payer: MEDICARE

## 2018-06-28 DIAGNOSIS — K7460 Unspecified cirrhosis of liver: Principal | ICD-10-CM

## 2018-06-28 DIAGNOSIS — R188 Other ascites: Secondary | ICD-10-CM

## 2018-07-05 ENCOUNTER — Ambulatory Visit: Admit: 2018-07-05 | Discharge: 2018-07-06 | Payer: MEDICARE

## 2018-07-05 DIAGNOSIS — R188 Other ascites: Secondary | ICD-10-CM

## 2018-07-05 DIAGNOSIS — C220 Liver cell carcinoma: Secondary | ICD-10-CM

## 2018-07-05 DIAGNOSIS — K7460 Unspecified cirrhosis of liver: Principal | ICD-10-CM

## 2018-07-10 MED ORDER — ESCITALOPRAM 5 MG TABLET
ORAL_TABLET | 3 refills | 0 days | Status: CP
Start: 2018-07-10 — End: 2018-11-17

## 2018-07-12 ENCOUNTER — Ambulatory Visit: Admit: 2018-07-12 | Discharge: 2018-07-13 | Payer: MEDICARE

## 2018-07-12 DIAGNOSIS — K7460 Unspecified cirrhosis of liver: Principal | ICD-10-CM

## 2018-07-13 ENCOUNTER — Ambulatory Visit: Payer: Medicare HMO | Attending: Anesthesiology | Admitting: Anesthesiology

## 2018-07-13 ENCOUNTER — Other Ambulatory Visit: Payer: Self-pay

## 2018-07-13 ENCOUNTER — Encounter: Payer: Self-pay | Admitting: Anesthesiology

## 2018-07-13 VITALS — BP 131/65 | HR 72 | Temp 97.7°F | Resp 20 | Ht 66.0 in | Wt 260.0 lb

## 2018-07-13 DIAGNOSIS — M545 Low back pain, unspecified: Secondary | ICD-10-CM

## 2018-07-13 DIAGNOSIS — M5136 Other intervertebral disc degeneration, lumbar region: Secondary | ICD-10-CM | POA: Insufficient documentation

## 2018-07-13 DIAGNOSIS — M25511 Pain in right shoulder: Secondary | ICD-10-CM | POA: Diagnosis not present

## 2018-07-13 DIAGNOSIS — F1190 Opioid use, unspecified, uncomplicated: Secondary | ICD-10-CM

## 2018-07-13 DIAGNOSIS — M542 Cervicalgia: Secondary | ICD-10-CM | POA: Diagnosis not present

## 2018-07-13 DIAGNOSIS — G894 Chronic pain syndrome: Secondary | ICD-10-CM | POA: Insufficient documentation

## 2018-07-13 DIAGNOSIS — M47816 Spondylosis without myelopathy or radiculopathy, lumbar region: Secondary | ICD-10-CM | POA: Insufficient documentation

## 2018-07-13 DIAGNOSIS — M5386 Other specified dorsopathies, lumbar region: Secondary | ICD-10-CM | POA: Insufficient documentation

## 2018-07-13 DIAGNOSIS — M48062 Spinal stenosis, lumbar region with neurogenic claudication: Secondary | ICD-10-CM | POA: Insufficient documentation

## 2018-07-13 MED ORDER — OXYCODONE HCL 5 MG PO TABS: 5.0000 mg | ORAL_TABLET | Freq: Three times a day (TID) | ORAL | 0 refills | Status: DC

## 2018-07-13 MED ORDER — XIFAXAN 550 MG TABLET
ORAL_TABLET | 0 refills | 0 days | Status: CP
Start: 2018-07-13 — End: 2018-08-10

## 2018-07-13 NOTE — Progress Notes (Signed)
Nursing Pain Medication Assessment:  Safety precautions to be maintained throughout the outpatient stay will include: orient to surroundings, keep bed in low position, maintain call bell within reach at all times, provide assistance with transfer out of bed and ambulation.  Medication Inspection Compliance: Pill count conducted under aseptic conditions, in front of the patient. Neither the pills nor the bottle was removed from the patient's sight at any time. Once count was completed pills were immediately returned to the patient in their original bottle.  Medication: Oxycodone IR Pill/Patch Count: 37 of 90 pills remain Pill/Patch Appearance: Markings consistent with prescribed medication Bottle Appearance: Standard pharmacy container. Clearly labeled. Filled Date: 31 / 23 / 2019 Last Medication intake:  Today

## 2018-07-13 NOTE — Patient Instructions (Signed)
You were given 2 prescriptions for Oxycodone today. 

## 2018-07-17 NOTE — Progress Notes (Signed)
Subjective:  Patient ID: Whitney Cline, female    DOB: 26-Oct-1945  Age: 72 y.o. MRN: [id number]  CC: Back Pain (lower); Neck Pain; and Shoulder Pain (right)   Procedure: None  HPI Whitney Cline presents for reevaluation.  She was last seen 2 months ago and is still doing well with her existing regimen.  No significant side effects are noted with the medication and based on her narcotic assessment sheet she continues to derive good functional lifestyle improvement with these medicines.  She is taking them as prescribed normally taking 2 or 3 tablets/day for breakthrough pain.  This regimen seems to be working well for her chronic intractable diffuse pain.  Outpatient Medications Prior to Visit  Medication Sig Dispense Refill  . ACCU-CHEK AVIVA PLUS test strip USE QID UTD  2  . albuterol (PROVENTIL HFA;VENTOLIN HFA) 108 (90 Base) MCG/ACT inhaler Inhale 2 puffs into the lungs every 6 (six) hours as needed for wheezing or shortness of breath.    Marland Kitchen albuterol (PROVENTIL) (2.5 MG/3ML) 0.083% nebulizer solution VVN Q 6 H PRF WHZ  12  . azelastine (ASTELIN) 0.1 % nasal spray Place into the nose.    . Blood Glucose Monitoring Suppl (FIFTY50 GLUCOSE METER 2.0) w/Device KIT Use as directed. E11.9    . budesonide-formoterol (SYMBICORT) 160-4.5 MCG/ACT inhaler Inhale 2 puffs into the lungs 2 (two) times daily.    . cephALEXin (KEFLEX) 250 MG capsule Take by mouth 4 (four) times daily.    . cetirizine (ZYRTEC) 10 MG tablet Take 10 mg by mouth daily.    . cholecalciferol (VITAMIN D) 400 units TABS tablet Take 400 Units by mouth.    . ferrous sulfate 325 (65 FE) MG EC tablet Take by mouth.    . gabapentin (NEURONTIN) 300 MG capsule Take 300 mg by mouth daily at 12 noon.    Marland Kitchen glucose blood test strip USE FOUR TIMES DAILY AS INSTRUCTED    . Insulin Pen Needle (PEN NEEDLES) 32G X 4 MM MISC Use for injections humalog TID times daily,  then glargine daily accucheck viva machine    . lactulose,  encephalopathy, (GENERLAC) 10 GM/15ML SOLN Take 30 g by mouth daily as needed.    . Lancets (ACCU-CHEK MULTICLIX) lancets USE ONCE D AS INSTRUCTED  6  . lidocaine (XYLOCAINE) 5 % ointment Apply topically.    . liraglutide (VICTOZA) 18 MG/3ML SOPN 1.8 mg.    . naloxone (NARCAN) 0.4 MG/ML injection Inject 0.4 mg into the vein as needed.    . Naloxone HCl 0.4 MG/0.4ML SOAJ Inject 1 Units as directed once daily as needed.    . rifaximin (XIFAXAN) 550 MG TABS tablet Take 550 mg by mouth 2 (two) times daily.    Marland Kitchen tiotropium (SPIRIVA) 18 MCG inhalation capsule Place 18 mcg into inhaler and inhale daily.    Marland Kitchen oxyCODONE (OXY IR/ROXICODONE) 5 MG immediate release tablet Take 1 tablet (5 mg total) by mouth 3 (three) times daily. 90 tablet 0  . carisoprodol (SOMA) 350 MG tablet Take 1 tablet (350 mg total) by mouth 4 (four) times daily. (Patient not taking: Reported on 05/16/2018) 30 tablet 5  . carvedilol (COREG) 3.125 MG tablet Take 3.125 mg by mouth daily.     . cyclobenzaprine (FLEXERIL) 5 MG tablet TAKE 1 TABLET(5 MG) BY MOUTH THREE TIMES DAILY AS NEEDED FOR MUSCLE SPASMS (Patient not taking: Reported on 08/09/2017) 75 tablet 0  . fluconazole (DIFLUCAN) 100 MG tablet   0  . furosemide (  LASIX) 20 MG tablet Take 10 mg by mouth daily.     . hydrOXYzine (ATARAX/VISTARIL) 25 MG tablet Take by mouth.    . metFORMIN (GLUCOPHAGE) 500 MG tablet Take 1,000 mg by mouth 2 (two) times daily with a meal.    . methocarbamol (ROBAXIN) 750 MG tablet Take 1 tablet (750 mg total) by mouth 3 (three) times daily. (Patient not taking: Reported on 03/21/2018) 90 tablet 2  . pantoprazole (PROTONIX) 40 MG tablet   1   Facility-Administered Medications Prior to Visit  Medication Dose Route Frequency Provider Last Rate Last Dose  . dexamethasone (DECADRON) injection 10 mg  10 mg Other Once Molli Barrows, MD      . dexamethasone (DECADRON) injection 4 mg  4 mg Other Once Molli Barrows, MD      . ropivacaine (PF) 2 mg/ml (0.2%)  (NAROPIN) epidural 1 mL  1 mL Epidural Once Molli Barrows, MD      . ropivacaine (PF) 2 mg/mL (0.2%) (NAROPIN) injection 10 mL  10 mL Epidural Once Molli Barrows, MD        Review of Systems CNS: No confusion or sedation Cardiac: No angina or palpitations GI: No abdominal pain or constipation Constitutional: No nausea vomiting fevers or chills  Objective:  BP 131/65   Pulse 72   Temp 97.7 F (36.5 C) (Oral)   Resp 20   Ht '5\' 6"'$  (1.676 m)   Wt 260 lb (117.9 kg)   SpO2 100% Comment: on 3L O2 from home  BMI 41.97 kg/m    BP Readings from Last 3 Encounters:  07/13/18 131/65  05/16/18 (!) 122/55  03/21/18 (!) 122/48     Wt Readings from Last 3 Encounters:  07/13/18 260 lb (117.9 kg)  05/16/18 260 lb (117.9 kg)  03/21/18 260 lb (117.9 kg)     Physical Exam Pt is alert and oriented PERRL EOMI HEART IS RRR no murmur or rub LCTA no wheezing or rales MUSCULOSKELETAL reveals some paraspinous muscle tenderness in the lumbar region.  Her muscle tone and bulk is at baseline.  Labs  Lab Results  Component Value Date   HGBA1C 9.4 (H) 10/29/2012   Lab Results  Component Value Date   CREATININE 1.70 (H) 08/19/2016    -------------------------------------------------------------------------------------------------------------------- Lab Results  Component Value Date   WBC 13.7 (H) 08/19/2016   HGB 6.5 (L) 08/19/2016   HCT 20.4 (L) 08/19/2016   PLT 246 08/19/2016   GLUCOSE 265 (H) 08/19/2016   ALT 19 08/19/2016   AST 47 (H) 08/19/2016   NA 133 (L) 08/19/2016   K 5.5 (H) 08/19/2016   CL 98 (L) 08/19/2016   CREATININE 1.70 (H) 08/19/2016   BUN 97 (H) 08/19/2016   CO2 22 08/19/2016   INR 1.1 10/28/2012   HGBA1C 9.4 (H) 10/29/2012    --------------------------------------------------------------------------------------------------------------------- Dg Chest 2 View  Result Date: 08/19/2016 CLINICAL DATA:  Dyspnea and wheezing x2 days wound EXAM: CHEST  2 VIEW  COMPARISON:  Chest CT 08/12/2007, CXR 07/28/2013 0 FINDINGS: Unchanged right lower lobe well-circumscribed 3.3 cm mass dating back to 2009 consistent with a benign finding. Mild interstitial prominence noted bilaterally which may reflect bronchitic change. No alveolar consolidation, effusion or pneumothorax. Heart is top-normal in size. There is aortic atherosclerosis without aneurysm. IMPRESSION: Stable right lower lobe 3.3 cm mass dating back to 2009 consistent with a benign finding. Mild interstitial prominence bilaterally which may reflect bronchitic change. No pulmonary consolidation. Aortic atherosclerosis. Electronically Signed  By: Ashley Royalty M.D.   On: 08/19/2016 14:20     Assessment & Plan:   Whitney Cline was seen today for back pain, neck pain and shoulder pain.  Diagnoses and all orders for this visit:  Chronic pain syndrome -     ToxASSURE Select 13 (MW), Urine  Low back pain at multiple sites  Cervicalgia  Pain in joint of right shoulder  DDD (degenerative disc disease), lumbar  Chronic, continuous use of opioids -     ToxASSURE Select 13 (MW), Urine  Facet arthritis of lumbar region  Spinal stenosis, lumbar region, with neurogenic claudication  Low back derangement syndrome  Other orders -     Discontinue: oxyCODONE (OXY IR/ROXICODONE) 5 MG immediate release tablet; Take 1 tablet (5 mg total) by mouth 3 (three) times daily. -     oxyCODONE (OXY IR/ROXICODONE) 5 MG immediate release tablet; Take 1 tablet (5 mg total) by mouth 3 (three) times daily.        ----------------------------------------------------------------------------------------------------------------------  Problem List Items Addressed This Visit    None    Visit Diagnoses    Chronic pain syndrome    -  Primary   Relevant Orders   ToxASSURE Select 13 (MW), Urine   Low back pain at multiple sites       Relevant Medications   oxyCODONE (OXY IR/ROXICODONE) 5 MG immediate release tablet (Start on  08/18/2018)   Cervicalgia       Pain in joint of right shoulder       DDD (degenerative disc disease), lumbar       Relevant Medications   oxyCODONE (OXY IR/ROXICODONE) 5 MG immediate release tablet (Start on 08/18/2018)   Chronic, continuous use of opioids       Relevant Orders   ToxASSURE Select 13 (MW), Urine   Facet arthritis of lumbar region       Relevant Medications   oxyCODONE (OXY IR/ROXICODONE) 5 MG immediate release tablet (Start on 08/18/2018)   Spinal stenosis, lumbar region, with neurogenic claudication       Relevant Medications   oxyCODONE (OXY IR/ROXICODONE) 5 MG immediate release tablet (Start on 08/18/2018)   Low back derangement syndrome       Relevant Medications   oxyCODONE (OXY IR/ROXICODONE) 5 MG immediate release tablet (Start on 08/18/2018)        ----------------------------------------------------------------------------------------------------------------------  1. Chronic pain syndrome We will continue with her current regimen.  Refills will be given for December 18 and January 17 of next year.  Lab return to clinic in 2 months.  We have reviewed the [facility] practitioner database information and it is appropriate. - ToxASSURE Select 13 (MW), Urine  2. Low back pain at multiple sites Above  3. Cervicalgia Continue stretching exercises as discussed  4. Pain in joint of right shoulder   5. DDD (degenerative disc disease), lumbar   6. Chronic, continuous use of opioids As above - ToxASSURE Select 13 (MW), Urine  7. Facet arthritis of lumbar region   8. Spinal stenosis, lumbar region, with neurogenic claudication   9. Low back derangement syndrome     ----------------------------------------------------------------------------------------------------------------------  I am having Whitney Cline. Whitney Cline maintain her albuterol, budesonide-formoterol, carvedilol, cetirizine, cholecalciferol, furosemide, gabapentin, metFORMIN, naloxone,  tiotropium, glucose blood, albuterol, azelastine, FIFTY50 GLUCOSE METER 2.0, ACCU-CHEK AVIVA PLUS, hydrOXYzine, lidocaine, Pen Needles, accu-chek multiclix, ferrous sulfate, Naloxone HCl, liraglutide, carisoprodol, lactulose (encephalopathy), rifaximin, cephALEXin, cyclobenzaprine, fluconazole, pantoprazole, methocarbamol, and oxyCODONE. We will continue to administer dexamethasone, ropivacaine (PF) 2 mg/mL (0.2%), dexamethasone,  and ropivacaine (PF) 2 mg/mL (0.2%).   Meds ordered this encounter  Medications  . DISCONTD: oxyCODONE (OXY IR/ROXICODONE) 5 MG immediate release tablet    Sig: Take 1 tablet (5 mg total) by mouth 3 (three) times daily.    Dispense:  90 tablet    Refill:  0    Do not fill until [id number]  . oxyCODONE (OXY IR/ROXICODONE) 5 MG immediate release tablet    Sig: Take 1 tablet (5 mg total) by mouth 3 (three) times daily.    Dispense:  90 tablet    Refill:  0    Do not fill until [id number]   Patient's Medications  New Prescriptions   No medications on file  Previous Medications   ACCU-CHEK AVIVA PLUS TEST STRIP    USE QID UTD   ALBUTEROL (PROVENTIL HFA;VENTOLIN HFA) 108 (90 BASE) MCG/ACT INHALER    Inhale 2 puffs into the lungs every 6 (six) hours as needed for wheezing or shortness of breath.   ALBUTEROL (PROVENTIL) (2.5 MG/3ML) 0.083% NEBULIZER SOLUTION    VVN Q 6 H PRF WHZ   AZELASTINE (ASTELIN) 0.1 % NASAL SPRAY    Place into the nose.   BLOOD GLUCOSE MONITORING SUPPL (FIFTY50 GLUCOSE METER 2.0) W/DEVICE KIT    Use as directed. E11.9   BUDESONIDE-FORMOTEROL (SYMBICORT) 160-4.5 MCG/ACT INHALER    Inhale 2 puffs into the lungs 2 (two) times daily.   CARISOPRODOL (SOMA) 350 MG TABLET    Take 1 tablet (350 mg total) by mouth 4 (four) times daily.   CARVEDILOL (COREG) 3.125 MG TABLET    Take 3.125 mg by mouth daily.    CEPHALEXIN (KEFLEX) 250 MG CAPSULE    Take by mouth 4 (four) times daily.   CETIRIZINE (ZYRTEC) 10 MG TABLET    Take 10 mg by mouth daily.    CHOLECALCIFEROL (VITAMIN D) 400 UNITS TABS TABLET    Take 400 Units by mouth.   CYCLOBENZAPRINE (FLEXERIL) 5 MG TABLET    TAKE 1 TABLET(5 MG) BY MOUTH THREE TIMES DAILY AS NEEDED FOR MUSCLE SPASMS   FERROUS SULFATE 325 (65 FE) MG EC TABLET    Take by mouth.   FLUCONAZOLE (DIFLUCAN) 100 MG TABLET       FUROSEMIDE (LASIX) 20 MG TABLET    Take 10 mg by mouth daily.    GABAPENTIN (NEURONTIN) 300 MG CAPSULE    Take 300 mg by mouth daily at 12 noon.   GLUCOSE BLOOD TEST STRIP    USE FOUR TIMES DAILY AS INSTRUCTED   HYDROXYZINE (ATARAX/VISTARIL) 25 MG TABLET    Take by mouth.   INSULIN PEN NEEDLE (PEN NEEDLES) 32G X 4 MM MISC    Use for injections humalog TID times daily,  then glargine daily accucheck viva machine   LACTULOSE, ENCEPHALOPATHY, (GENERLAC) 10 GM/15ML SOLN    Take 30 g by mouth daily as needed.   LANCETS (ACCU-CHEK MULTICLIX) LANCETS    USE ONCE D AS INSTRUCTED   LIDOCAINE (XYLOCAINE) 5 % OINTMENT    Apply topically.   LIRAGLUTIDE (VICTOZA) 18 MG/3ML SOPN    1.8 mg.   METFORMIN (GLUCOPHAGE) 500 MG TABLET    Take 1,000 mg by mouth 2 (two) times daily with a meal.   METHOCARBAMOL (ROBAXIN) 750 MG TABLET    Take 1 tablet (750 mg total) by mouth 3 (three) times daily.   NALOXONE (NARCAN) 0.4 MG/ML INJECTION    Inject 0.4 mg into the vein as needed.   NALOXONE HCL 0.4  MG/0.4ML SOAJ    Inject 1 Units as directed once daily as needed.   PANTOPRAZOLE (PROTONIX) 40 MG TABLET       RIFAXIMIN (XIFAXAN) 550 MG TABS TABLET    Take 550 mg by mouth 2 (two) times daily.   TIOTROPIUM (SPIRIVA) 18 MCG INHALATION CAPSULE    Place 18 mcg into inhaler and inhale daily.  Modified Medications   Modified Medication Previous Medication   OXYCODONE (OXY IR/ROXICODONE) 5 MG IMMEDIATE RELEASE TABLET oxyCODONE (OXY IR/ROXICODONE) 5 MG immediate release tablet      Take 1 tablet (5 mg total) by mouth 3 (three) times daily.    Take 1 tablet (5 mg total) by mouth 3 (three) times daily.  Discontinued Medications   No  medications on file   ----------------------------------------------------------------------------------------------------------------------  Follow-up: Return in about 2 months (around 09/13/2018) for evaluation, med refill.    Molli Barrows, MD

## 2018-07-19 ENCOUNTER — Ambulatory Visit: Admit: 2018-07-19 | Discharge: 2018-07-20 | Payer: MEDICARE

## 2018-07-19 DIAGNOSIS — K7460 Unspecified cirrhosis of liver: Principal | ICD-10-CM

## 2018-07-19 DIAGNOSIS — R188 Other ascites: Secondary | ICD-10-CM

## 2018-07-21 MED ORDER — LEVOFLOXACIN 500 MG TABLET
ORAL_TABLET | Freq: Every day | ORAL | 0 refills | 0.00000 days | Status: CP
Start: 2018-07-21 — End: 2018-07-26

## 2018-07-24 ENCOUNTER — Ambulatory Visit: Admit: 2018-07-24 | Discharge: 2018-07-24 | Payer: MEDICARE

## 2018-07-24 DIAGNOSIS — C220 Liver cell carcinoma: Principal | ICD-10-CM

## 2018-07-24 DIAGNOSIS — K7460 Unspecified cirrhosis of liver: Secondary | ICD-10-CM

## 2018-07-31 ENCOUNTER — Ambulatory Visit: Admit: 2018-07-31 | Discharge: 2018-08-01 | Payer: MEDICARE

## 2018-07-31 DIAGNOSIS — D509 Iron deficiency anemia, unspecified: Secondary | ICD-10-CM

## 2018-07-31 DIAGNOSIS — R188 Other ascites: Secondary | ICD-10-CM

## 2018-07-31 DIAGNOSIS — K7460 Unspecified cirrhosis of liver: Principal | ICD-10-CM

## 2018-08-09 ENCOUNTER — Ambulatory Visit: Admit: 2018-08-09 | Discharge: 2018-08-10 | Payer: MEDICARE

## 2018-08-09 DIAGNOSIS — K7460 Unspecified cirrhosis of liver: Principal | ICD-10-CM

## 2018-08-09 DIAGNOSIS — B379 Candidiasis, unspecified: Secondary | ICD-10-CM

## 2018-08-09 MED ORDER — FLUCONAZOLE 100 MG TABLET
ORAL_TABLET | Freq: Every day | ORAL | 1 refills | 0 days | Status: CP
Start: 2018-08-09 — End: 2018-08-12

## 2018-08-10 MED ORDER — XIFAXAN 550 MG TABLET
ORAL_TABLET | 0 refills | 0 days | Status: CP
Start: 2018-08-10 — End: 2018-09-28

## 2018-08-16 ENCOUNTER — Ambulatory Visit: Admit: 2018-08-16 | Discharge: 2018-08-17 | Payer: MEDICARE

## 2018-08-16 DIAGNOSIS — K7460 Unspecified cirrhosis of liver: Principal | ICD-10-CM

## 2018-08-16 DIAGNOSIS — R188 Other ascites: Secondary | ICD-10-CM

## 2018-08-23 ENCOUNTER — Ambulatory Visit: Admit: 2018-08-23 | Discharge: 2018-08-24 | Payer: MEDICARE

## 2018-08-23 DIAGNOSIS — K7460 Unspecified cirrhosis of liver: Principal | ICD-10-CM

## 2018-08-30 ENCOUNTER — Ambulatory Visit: Admit: 2018-08-30 | Discharge: 2018-08-31 | Payer: MEDICARE

## 2018-08-30 DIAGNOSIS — R188 Other ascites: Secondary | ICD-10-CM

## 2018-08-30 DIAGNOSIS — K7460 Unspecified cirrhosis of liver: Principal | ICD-10-CM

## 2018-09-05 ENCOUNTER — Encounter: Payer: Medicare HMO | Admitting: Anesthesiology

## 2018-09-06 ENCOUNTER — Ambulatory Visit: Admit: 2018-09-06 | Discharge: 2018-09-07 | Payer: MEDICARE

## 2018-09-06 ENCOUNTER — Encounter: Payer: Self-pay | Admitting: Anesthesiology

## 2018-09-06 ENCOUNTER — Ambulatory Visit: Payer: Medicare HMO | Attending: Anesthesiology | Admitting: Anesthesiology

## 2018-09-06 ENCOUNTER — Other Ambulatory Visit: Payer: Self-pay

## 2018-09-06 VITALS — BP 150/61 | HR 89 | Temp 97.8°F | Resp 20 | Ht 66.0 in | Wt 260.0 lb

## 2018-09-06 DIAGNOSIS — K7460 Unspecified cirrhosis of liver: Principal | ICD-10-CM

## 2018-09-06 DIAGNOSIS — R188 Other ascites: Secondary | ICD-10-CM

## 2018-09-06 DIAGNOSIS — M545 Low back pain, unspecified: Secondary | ICD-10-CM

## 2018-09-06 DIAGNOSIS — M5136 Other intervertebral disc degeneration, lumbar region: Secondary | ICD-10-CM | POA: Insufficient documentation

## 2018-09-06 DIAGNOSIS — M7918 Myalgia, other site: Secondary | ICD-10-CM | POA: Diagnosis present

## 2018-09-06 DIAGNOSIS — M47816 Spondylosis without myelopathy or radiculopathy, lumbar region: Secondary | ICD-10-CM | POA: Insufficient documentation

## 2018-09-06 DIAGNOSIS — M5386 Other specified dorsopathies, lumbar region: Secondary | ICD-10-CM | POA: Diagnosis present

## 2018-09-06 DIAGNOSIS — M542 Cervicalgia: Secondary | ICD-10-CM | POA: Diagnosis present

## 2018-09-06 DIAGNOSIS — G894 Chronic pain syndrome: Secondary | ICD-10-CM | POA: Diagnosis present

## 2018-09-06 DIAGNOSIS — M48062 Spinal stenosis, lumbar region with neurogenic claudication: Secondary | ICD-10-CM | POA: Diagnosis present

## 2018-09-06 DIAGNOSIS — M25511 Pain in right shoulder: Secondary | ICD-10-CM | POA: Diagnosis present

## 2018-09-06 DIAGNOSIS — F1190 Opioid use, unspecified, uncomplicated: Secondary | ICD-10-CM | POA: Diagnosis present

## 2018-09-06 MED ORDER — OXYCODONE HCL 5 MG PO TABS: 5.0000 mg | ORAL_TABLET | Freq: Four times a day (QID) | ORAL | 0 refills | Status: DC | PRN

## 2018-09-06 MED ORDER — OXYCODONE HCL 5 MG PO TABS: 5.0000 mg | ORAL_TABLET | Freq: Three times a day (TID) | ORAL | 0 refills | Status: AC

## 2018-09-06 NOTE — Progress Notes (Signed)
Nursing Pain Medication Assessment:  Safety precautions to be maintained throughout the outpatient stay will include: orient to surroundings, keep bed in low position, maintain call bell within reach at all times, provide assistance with transfer out of bed and ambulation.  Medication Inspection Compliance: Pill count conducted under aseptic conditions, in front of the patient. Neither the pills nor the bottle was removed from the patient's sight at any time. Once count was completed pills were immediately returned to the patient in their original bottle.  Medication: Oxycodone IR Pill/Patch Count: 41 of 90 pills remain Pill/Patch Appearance: Markings consistent with prescribed medication Bottle Appearance: Standard pharmacy container. Clearly labeled. Filled Date: 01 / 18 / 2020 Last Medication intake:  Today

## 2018-09-06 NOTE — Patient Instructions (Signed)
____________________________________________________________________________________________  Preparing for your procedure (without sedation)  Instructions: . Oral Intake: Do not eat or drink anything for at least 3 hours prior to your procedure. . Transportation: Unless otherwise stated by your physician, you may drive yourself after the procedure. . Blood Pressure Medicine: Take your blood pressure medicine with a sip of water the morning of the procedure. . Blood thinners: Notify our staff if you are taking any blood thinners. Depending on which one you take, there will be specific instructions on how and when to stop it. . Diabetics on insulin: Notify the staff so that you can be scheduled 1st case in the morning. If your diabetes requires high dose insulin, take only  of your normal insulin dose the morning of the procedure and notify the staff that you have done so. . Preventing infections: Shower with an antibacterial soap the morning of your procedure.  . Build-up your immune system: Take 1000 mg of Vitamin C with every meal (3 times a day) the day prior to your procedure. Marland Kitchen Antibiotics: Inform the staff if you have a condition or reason that requires you to take antibiotics before dental procedures. . Pregnancy: If you are pregnant, call and cancel the procedure. . Sickness: If you have a cold, fever, or any active infections, call and cancel the procedure. . Arrival: You must be in the facility at least 30 minutes prior to your scheduled procedure. . Children: Do not bring any children with you. . Dress appropriately: Bring dark clothing that you would not mind if they get stained. . Valuables: Do not bring any jewelry or valuables.  Procedure appointments are reserved for interventional treatments only. Marland Kitchen No Prescription Refills. . No medication changes will be discussed during procedure appointments. . No disability issues will be discussed.  Reasons to call and reschedule or  cancel your procedure: (Following these recommendations will minimize the risk of a serious complication.) . Surgeries: Avoid having procedures within 2 weeks of any surgery. (Avoid for 2 weeks before or after any surgery). . Flu Shots: Avoid having procedures within 2 weeks of a flu shots or . (Avoid for 2 weeks before or after immunizations). . Barium: Avoid having a procedure within 7-10 days after having had a radiological study involving the use of radiological contrast. (Myelograms, Barium swallow or enema study). . Heart attacks: Avoid any elective procedures or surgeries for the initial 6 months after a "Myocardial Infarction" (Heart Attack). . Blood thinners: It is imperative that you stop these medications before procedures. Let us know if you if you take any blood thinner.  . Infection: Avoid procedures during or within two weeks of an infection (including chest colds or gastrointestinal problems). Symptoms associated with infections include: Localized redness, fever, chills, night sweats or profuse sweating, burning sensation when voiding, cough, congestion, stuffiness, runny nose, sore throat, diarrhea, nausea, vomiting, cold or Flu symptoms, recent or current infections. It is specially important if the infection is over the area that we intend to treat. Marland Kitchen Heart and lung problems: Symptoms that may suggest an active cardiopulmonary problem include: cough, chest pain, breathing difficulties or shortness of breath, dizziness, ankle swelling, uncontrolled high or unusually low blood pressure, and/or palpitations. If you are experiencing any of these symptoms, cancel your procedure and contact your primary care physician for an evaluation.  Remember:  Regular Business hours are:  Monday to Thursday 8:00 AM to 4:00 PM  Provider's Schedule: Milinda Pointer, MD:  Procedure days: Tuesday and Thursday 7:30 AM to  4:00 PM  Gillis Santa, MD:  Procedure days: Monday and Wednesday 7:30 AM to 4:00  PM ____________________________________________________________________________________________   Trigger Point Injection Trigger points are areas where you have pain. A trigger point injection is a shot given in the trigger point to help relieve pain for a few days to a few months. Common places for trigger points include:  The neck.  The shoulders.  The upper back.  The lower back. A trigger point injection will not cure long-lasting (chronic) pain permanently. These injections do not always work for every person, but for some people they can help to relieve pain for a few days to a few months. Tell a health care provider about:  Any allergies you have.  All medicines you are taking, including vitamins, herbs, eye drops, creams, and over-the-counter medicines.  Any problems you or family members have had with anesthetic medicines.  Any blood disorders you have.  Any surgeries you have had.  Any medical conditions you have. What are the risks? Generally, this is a safe procedure. However, problems may occur, including:  Infection.  Bleeding.  Allergic reaction to the injected medicine.  Irritation of the skin around the injection site. What happens before the procedure?  Ask your health care provider about changing or stopping your regular medicines. This is especially important if you are taking diabetes medicines or blood thinners. What happens during the procedure?  Your health care provider will feel for trigger points. A marker may be used to circle the area for the injection.  The skin over the trigger point will be washed with a germ-killing (antiseptic) solution.  A thin needle is used for the shot. You may feel pain or a twitching feeling when the needle enters the trigger point.  A numbing solution may be injected into the trigger point. Sometimes a medicine to keep down swelling, redness, and warmth (inflammation) is also injected.  Your health care  provider may move the needle around the area where the trigger point is located until the tightness and twitching goes away.  After the injection, your health care provider may put gentle pressure over the injection site.  The injection site will be covered with a bandage (dressing). The procedure may vary among health care providers and hospitals. What happens after the procedure?  The dressing can be taken off in a few hours or as told by your health care provider.  You may feel sore and stiff for 1-2 days. This information is not intended to replace advice given to you by your health care provider. Make sure you discuss any questions you have with your health care provider. Document Released: 07/08/2011 Document Revised: 03/21/2016 Document Reviewed: 01/06/2015 Elsevier Interactive Patient Education  2019 [employer].

## 2018-09-09 NOTE — Progress Notes (Signed)
Subjective:  Patient ID: Whitney Cline, female    DOB: 1946/03/26  Age: 73 y.o. MRN: [id number]  CC: Back Pain (low, right buttock pain)   Procedure: None  HPI Whitney Cline presents for reevaluation.  She was last seen 2 months and continues to struggle with her low back pain and spasming in the back with radiation in the knees hips and buttocks.  She continues to utilize ambulatory assistance and has chronic problems with her pulmonary function.  The quality of her pain is been stable in nature and responds favorably to the oxycodone 5 mg tablets.  She takes these 3 times a day and does well with these based on her narcotic assessment sheet.  No untoward side effects are noted.  Otherwise she is in her usual state of health at this time in regards to her chronic pain management.  Outpatient Medications Prior to Visit  Medication Sig Dispense Refill  . ACCU-CHEK AVIVA PLUS test strip USE QID UTD  2  . albuterol (PROVENTIL HFA;VENTOLIN HFA) 108 (90 Base) MCG/ACT inhaler Inhale 2 puffs into the lungs every 6 (six) hours as needed for wheezing or shortness of breath.    Marland Kitchen albuterol (PROVENTIL) (2.5 MG/3ML) 0.083% nebulizer solution VVN Q 6 H PRF WHZ  12  . azelastine (ASTELIN) 0.1 % nasal spray Place into the nose.    . Blood Glucose Monitoring Suppl (FIFTY50 GLUCOSE METER 2.0) w/Device KIT Use as directed. E11.9    . budesonide-formoterol (SYMBICORT) 160-4.5 MCG/ACT inhaler Inhale 2 puffs into the lungs 2 (two) times daily.    . carisoprodol (SOMA) 350 MG tablet Take 1 tablet (350 mg total) by mouth 4 (four) times daily. 30 tablet 5  . carvedilol (COREG) 3.125 MG tablet Take 3.125 mg by mouth daily.     . cetirizine (ZYRTEC) 10 MG tablet Take 10 mg by mouth daily.    . cholecalciferol (VITAMIN D) 400 units TABS tablet Take 400 Units by mouth.    . fluconazole (DIFLUCAN) 100 MG tablet   0  . furosemide (LASIX) 20 MG tablet Take 10 mg by mouth daily.     Marland Kitchen gabapentin (NEURONTIN)  300 MG capsule Take 300 mg by mouth at bedtime.     Marland Kitchen glucose blood test strip USE FOUR TIMES DAILY AS INSTRUCTED    . Insulin Pen Needle (PEN NEEDLES) 32G X 4 MM MISC Use for injections humalog TID times daily,  then glargine daily accucheck viva machine    . lactulose, encephalopathy, (GENERLAC) 10 GM/15ML SOLN Take 30 g by mouth daily as needed.    . Lancets (ACCU-CHEK MULTICLIX) lancets USE ONCE D AS INSTRUCTED  6  . lidocaine (XYLOCAINE) 5 % ointment Apply topically.    . liraglutide (VICTOZA) 18 MG/3ML SOPN 1.8 mg.    . metFORMIN (GLUCOPHAGE) 500 MG tablet Take 1,000 mg by mouth 2 (two) times daily with a meal.    . naloxone (NARCAN) 0.4 MG/ML injection Inject 0.4 mg into the vein as needed.    . Naloxone HCl 0.4 MG/0.4ML SOAJ Inject 1 Units as directed once daily as needed.    . pantoprazole (PROTONIX) 40 MG tablet   1  . rifaximin (XIFAXAN) 550 MG TABS tablet Take 550 mg by mouth 2 (two) times daily.    Marland Kitchen tiotropium (SPIRIVA) 18 MCG inhalation capsule Place 18 mcg into inhaler and inhale daily.    Marland Kitchen oxyCODONE (OXY IR/ROXICODONE) 5 MG immediate release tablet Take 1 tablet (5 mg total) by  mouth 3 (three) times daily. 90 tablet 0  . cephALEXin (KEFLEX) 250 MG capsule Take by mouth 4 (four) times daily.    . cyclobenzaprine (FLEXERIL) 5 MG tablet TAKE 1 TABLET(5 MG) BY MOUTH THREE TIMES DAILY AS NEEDED FOR MUSCLE SPASMS (Patient not taking: Reported on 08/09/2017) 75 tablet 0  . ferrous sulfate 325 (65 FE) MG EC tablet Take by mouth.    . hydrOXYzine (ATARAX/VISTARIL) 25 MG tablet Take by mouth.    . methocarbamol (ROBAXIN) 750 MG tablet Take 1 tablet (750 mg total) by mouth 3 (three) times daily. (Patient not taking: Reported on 03/21/2018) 90 tablet 2   Facility-Administered Medications Prior to Visit  Medication Dose Route Frequency Provider Last Rate Last Dose  . dexamethasone (DECADRON) injection 10 mg  10 mg Other Once Molli Barrows, MD      . dexamethasone (DECADRON) injection 4 mg   4 mg Other Once Molli Barrows, MD      . ropivacaine (PF) 2 mg/ml (0.2%) (NAROPIN) epidural 1 mL  1 mL Epidural Once Molli Barrows, MD      . ropivacaine (PF) 2 mg/mL (0.2%) (NAROPIN) injection 10 mL  10 mL Epidural Once Molli Barrows, MD        Review of Systems CNS: No confusion or sedation Cardiac: No angina or palpitations GI: No abdominal pain or constipation Constitutional: No nausea vomiting fevers or chills  Objective:  BP (!) 150/61   Pulse 89   Temp 97.8 F (36.6 C) (Oral)   Resp 20   Ht _0  (1.676 m)   Wt 260 lb (117.9 kg)   SpO2 98% Comment: 2L o2 via [place]  BMI 41.97 kg/m    BP Readings from Last 3 Encounters:  09/06/18 (!) 150/61  07/13/18 131/65  05/16/18 (!) 122/55     Wt Readings from Last 3 Encounters:  09/06/18 260 lb (117.9 kg)  07/13/18 260 lb (117.9 kg)  05/16/18 260 lb (117.9 kg)     Physical Exam Pt is alert and oriented PERRL EOMI HEART IS RRR no murmur or rub LCTA no wheezing or rales MUSCULOSKELETAL reveals some paraspinous muscle tenderness with a trigger point in the right gluteal and right paraspinous L5 level.  Labs  Lab Results  Component Value Date   HGBA1C 9.4 (H) 10/29/2012   Lab Results  Component Value Date   CREATININE 1.70 (H) 08/19/2016    -------------------------------------------------------------------------------------------------------------------- Lab Results  Component Value Date   WBC 13.7 (H) 08/19/2016   HGB 6.5 (L) 08/19/2016   HCT 20.4 (L) 08/19/2016   PLT 246 08/19/2016   GLUCOSE 265 (H) 08/19/2016   ALT 19 08/19/2016   AST 47 (H) 08/19/2016   NA 133 (L) 08/19/2016   K 5.5 (H) 08/19/2016   CL 98 (L) 08/19/2016   CREATININE 1.70 (H) 08/19/2016   BUN 97 (H) 08/19/2016   CO2 22 08/19/2016   INR 1.1 10/28/2012   HGBA1C 9.4 (H) 10/29/2012    --------------------------------------------------------------------------------------------------------------------- Dg Chest 2 View  Result Date:  08/19/2016 CLINICAL DATA:  Dyspnea and wheezing x2 days wound EXAM: CHEST  2 VIEW COMPARISON:  Chest CT 08/12/2007, CXR 07/28/2013 0 FINDINGS: Unchanged right lower lobe well-circumscribed 3.3 cm mass dating back to 2009 consistent with a benign finding. Mild interstitial prominence noted bilaterally which may reflect bronchitic change. No alveolar consolidation, effusion or pneumothorax. Heart is top-normal in size. There is aortic atherosclerosis without aneurysm. IMPRESSION: Stable right lower lobe 3.3 cm mass dating back  to 2009 consistent with a benign finding. Mild interstitial prominence bilaterally which may reflect bronchitic change. No pulmonary consolidation. Aortic atherosclerosis. Electronically Signed   By: Ashley Royalty M.D.   On: 08/19/2016 14:20     Assessment & Plan:   Sindia was seen today for back pain.  Diagnoses and all orders for this visit:  Chronic pain syndrome  Low back pain at multiple sites  Cervicalgia  Pain in joint of right shoulder  DDD (degenerative disc disease), lumbar  Chronic, continuous use of opioids  Facet arthritis of lumbar region  Spinal stenosis, lumbar region, with neurogenic claudication  Low back derangement syndrome -     INJECT TRIGGER POINT, 1 OR 2  Gluteal pain -     INJECT TRIGGER POINT, 1 OR 2  Other orders -     oxyCODONE (OXY IR/ROXICODONE) 5 MG immediate release tablet; Take 1 tablet (5 mg total) by mouth 3 (three) times daily for 30 days. -     oxyCODONE (ROXICODONE) 5 MG immediate release tablet; Take 1 tablet (5 mg total) by mouth every 6 (six) hours as needed for up to 30 days for severe pain.        ----------------------------------------------------------------------------------------------------------------------  Problem List Items Addressed This Visit    None    Visit Diagnoses    Chronic pain syndrome    -  Primary   Low back pain at multiple sites       Relevant Medications   oxyCODONE (OXY  IR/ROXICODONE) 5 MG immediate release tablet (Start on 09/19/2018)   oxyCODONE (ROXICODONE) 5 MG immediate release tablet (Start on 10/19/2018)   Cervicalgia       Pain in joint of right shoulder       DDD (degenerative disc disease), lumbar       Relevant Medications   oxyCODONE (OXY IR/ROXICODONE) 5 MG immediate release tablet (Start on 09/19/2018)   oxyCODONE (ROXICODONE) 5 MG immediate release tablet (Start on 10/19/2018)   Chronic, continuous use of opioids       Facet arthritis of lumbar region       Relevant Medications   oxyCODONE (OXY IR/ROXICODONE) 5 MG immediate release tablet (Start on 09/19/2018)   oxyCODONE (ROXICODONE) 5 MG immediate release tablet (Start on 10/19/2018)   Spinal stenosis, lumbar region, with neurogenic claudication       Relevant Medications   oxyCODONE (OXY IR/ROXICODONE) 5 MG immediate release tablet (Start on 09/19/2018)   oxyCODONE (ROXICODONE) 5 MG immediate release tablet (Start on 10/19/2018)   Low back derangement syndrome       Relevant Medications   oxyCODONE (OXY IR/ROXICODONE) 5 MG immediate release tablet (Start on 09/19/2018)   oxyCODONE (ROXICODONE) 5 MG immediate release tablet (Start on 10/19/2018)   Other Relevant Orders   INJECT TRIGGER POINT, 1 OR 2   Gluteal pain       Relevant Orders   INJECT TRIGGER POINT, 1 OR 2        ----------------------------------------------------------------------------------------------------------------------  1. Chronic pain syndrome We will have her continue with her current regimen.  This is working well for her based on her conversation today and narcotic assessment sheet.  February 8 and March 19 refills will be given today.  We will have her return to clinic in 2 months.  She has been compliant with her regimen.  We have gone over the risks of chronic opioid management therapy.  2. Low back pain at multiple sites Continue efforts at stretching as tolerated.  This is very  difficult for her  unfortunately.  3. Cervicalgia   4. Pain in joint of right shoulder   5. DDD (degenerative disc disease), lumbar   6. Chronic, continuous use of opioids We have reviewed the [facility] practitioner database information and it is appropriate  7. Facet arthritis of lumbar region   8. Spinal stenosis, lumbar region, with neurogenic claudication   9. Low back derangement syndrome He does have a trigger point noted in the right gluteal right lumbar paraspinous level 5.  We will have her return to clinic in 2 weeks for a repeat trigger point injection. - INJECT TRIGGER POINT, 1 OR 2  10. Gluteal pain Above - INJECT TRIGGER POINT, 1 OR 2    ----------------------------------------------------------------------------------------------------------------------  I have changed Chauncey Reading. Nowaczyk's oxyCODONE. I am also having her start on oxyCODONE. Additionally, I am having her maintain her albuterol, budesonide-formoterol, carvedilol, cetirizine, cholecalciferol, furosemide, gabapentin, metFORMIN, naloxone, tiotropium, glucose blood, albuterol, azelastine, FIFTY50 GLUCOSE METER 2.0, ACCU-CHEK AVIVA PLUS, hydrOXYzine, lidocaine, Pen Needles, accu-chek multiclix, ferrous sulfate, Naloxone HCl, liraglutide, carisoprodol, lactulose (encephalopathy), rifaximin, cephALEXin, cyclobenzaprine, fluconazole, pantoprazole, and methocarbamol. We will continue to administer dexamethasone, ropivacaine (PF) 2 mg/mL (0.2%), dexamethasone, and ropivacaine (PF) 2 mg/mL (0.2%).   Meds ordered this encounter  Medications  . oxyCODONE (OXY IR/ROXICODONE) 5 MG immediate release tablet    Sig: Take 1 tablet (5 mg total) by mouth 3 (three) times daily for 30 days.    Dispense:  90 tablet    Refill:  0    Do not fill early .Marland Kitchen.. 30 day supply  . oxyCODONE (ROXICODONE) 5 MG immediate release tablet    Sig: Take 1 tablet (5 mg total) by mouth every 6 (six) hours as needed for up to 30 days for severe pain.     Dispense:  90 tablet    Refill:  0    30 day supply no early refills   Patient's Medications  New Prescriptions   OXYCODONE (ROXICODONE) 5 MG IMMEDIATE RELEASE TABLET    Take 1 tablet (5 mg total) by mouth every 6 (six) hours as needed for up to 30 days for severe pain.  Previous Medications   ACCU-CHEK AVIVA PLUS TEST STRIP    USE QID UTD   ALBUTEROL (PROVENTIL HFA;VENTOLIN HFA) 108 (90 BASE) MCG/ACT INHALER    Inhale 2 puffs into the lungs every 6 (six) hours as needed for wheezing or shortness of breath.   ALBUTEROL (PROVENTIL) (2.5 MG/3ML) 0.083% NEBULIZER SOLUTION    VVN Q 6 H PRF WHZ   AZELASTINE (ASTELIN) 0.1 % NASAL SPRAY    Place into the nose.   BLOOD GLUCOSE MONITORING SUPPL (FIFTY50 GLUCOSE METER 2.0) W/DEVICE KIT    Use as directed. E11.9   BUDESONIDE-FORMOTEROL (SYMBICORT) 160-4.5 MCG/ACT INHALER    Inhale 2 puffs into the lungs 2 (two) times daily.   CARISOPRODOL (SOMA) 350 MG TABLET    Take 1 tablet (350 mg total) by mouth 4 (four) times daily.   CARVEDILOL (COREG) 3.125 MG TABLET    Take 3.125 mg by mouth daily.    CEPHALEXIN (KEFLEX) 250 MG CAPSULE    Take by mouth 4 (four) times daily.   CETIRIZINE (ZYRTEC) 10 MG TABLET    Take 10 mg by mouth daily.   CHOLECALCIFEROL (VITAMIN D) 400 UNITS TABS TABLET    Take 400 Units by mouth.   CYCLOBENZAPRINE (FLEXERIL) 5 MG TABLET    TAKE 1 TABLET(5 MG) BY MOUTH THREE TIMES DAILY AS NEEDED FOR  MUSCLE SPASMS   FERROUS SULFATE 325 (65 FE) MG EC TABLET    Take by mouth.   FLUCONAZOLE (DIFLUCAN) 100 MG TABLET       FUROSEMIDE (LASIX) 20 MG TABLET    Take 10 mg by mouth daily.    GABAPENTIN (NEURONTIN) 300 MG CAPSULE    Take 300 mg by mouth at bedtime.    GLUCOSE BLOOD TEST STRIP    USE FOUR TIMES DAILY AS INSTRUCTED   HYDROXYZINE (ATARAX/VISTARIL) 25 MG TABLET    Take by mouth.   INSULIN PEN NEEDLE (PEN NEEDLES) 32G X 4 MM MISC    Use for injections humalog TID times daily,  then glargine daily accucheck viva machine   LACTULOSE,  ENCEPHALOPATHY, (GENERLAC) 10 GM/15ML SOLN    Take 30 g by mouth daily as needed.   LANCETS (ACCU-CHEK MULTICLIX) LANCETS    USE ONCE D AS INSTRUCTED   LIDOCAINE (XYLOCAINE) 5 % OINTMENT    Apply topically.   LIRAGLUTIDE (VICTOZA) 18 MG/3ML SOPN    1.8 mg.   METFORMIN (GLUCOPHAGE) 500 MG TABLET    Take 1,000 mg by mouth 2 (two) times daily with a meal.   METHOCARBAMOL (ROBAXIN) 750 MG TABLET    Take 1 tablet (750 mg total) by mouth 3 (three) times daily.   NALOXONE (NARCAN) 0.4 MG/ML INJECTION    Inject 0.4 mg into the vein as needed.   NALOXONE HCL 0.4 MG/0.4ML SOAJ    Inject 1 Units as directed once daily as needed.   PANTOPRAZOLE (PROTONIX) 40 MG TABLET       RIFAXIMIN (XIFAXAN) 550 MG TABS TABLET    Take 550 mg by mouth 2 (two) times daily.   TIOTROPIUM (SPIRIVA) 18 MCG INHALATION CAPSULE    Place 18 mcg into inhaler and inhale daily.  Modified Medications   Modified Medication Previous Medication   OXYCODONE (OXY IR/ROXICODONE) 5 MG IMMEDIATE RELEASE TABLET oxyCODONE (OXY IR/ROXICODONE) 5 MG immediate release tablet      Take 1 tablet (5 mg total) by mouth 3 (three) times daily for 30 days.    Take 1 tablet (5 mg total) by mouth 3 (three) times daily.  Discontinued Medications   No medications on file   ----------------------------------------------------------------------------------------------------------------------  Follow-up: Return in about 2 weeks (around 09/20/2018) for evaluation, med refill.    Molli Barrows, MD

## 2018-09-13 ENCOUNTER — Ambulatory Visit: Admit: 2018-09-13 | Discharge: 2018-09-14 | Payer: MEDICARE

## 2018-09-13 DIAGNOSIS — R188 Other ascites: Secondary | ICD-10-CM

## 2018-09-13 DIAGNOSIS — K7460 Unspecified cirrhosis of liver: Principal | ICD-10-CM

## 2018-09-20 ENCOUNTER — Ambulatory Visit: Admit: 2018-09-20 | Discharge: 2018-09-21 | Payer: MEDICARE

## 2018-09-20 DIAGNOSIS — R188 Other ascites: Secondary | ICD-10-CM

## 2018-09-20 DIAGNOSIS — K7460 Unspecified cirrhosis of liver: Principal | ICD-10-CM

## 2018-09-21 ENCOUNTER — Encounter: Payer: Medicare HMO | Admitting: Anesthesiology

## 2018-09-27 ENCOUNTER — Ambulatory Visit: Admit: 2018-09-27 | Discharge: 2018-09-28 | Payer: MEDICARE

## 2018-09-27 DIAGNOSIS — K7460 Unspecified cirrhosis of liver: Principal | ICD-10-CM

## 2018-09-28 MED ORDER — XIFAXAN 550 MG TABLET
ORAL_TABLET | 11 refills | 0 days | Status: CP
Start: 2018-09-28 — End: ?

## 2018-10-04 ENCOUNTER — Ambulatory Visit: Admit: 2018-10-04 | Discharge: 2018-10-04 | Payer: MEDICARE

## 2018-10-04 DIAGNOSIS — K7460 Unspecified cirrhosis of liver: Principal | ICD-10-CM

## 2018-10-04 DIAGNOSIS — K7469 Other cirrhosis of liver: Principal | ICD-10-CM

## 2018-10-04 DIAGNOSIS — C229 Malignant neoplasm of liver, not specified as primary or secondary: Principal | ICD-10-CM

## 2018-10-04 DIAGNOSIS — I10 Essential (primary) hypertension: Principal | ICD-10-CM

## 2018-10-04 DIAGNOSIS — J449 Chronic obstructive pulmonary disease, unspecified: Principal | ICD-10-CM

## 2018-10-04 DIAGNOSIS — I272 Pulmonary hypertension, unspecified: Principal | ICD-10-CM

## 2018-10-04 DIAGNOSIS — Z9989 Dependence on other enabling machines and devices: Principal | ICD-10-CM

## 2018-10-04 DIAGNOSIS — D509 Iron deficiency anemia, unspecified: Principal | ICD-10-CM

## 2018-10-04 DIAGNOSIS — B1920 Unspecified viral hepatitis C without hepatic coma: Principal | ICD-10-CM

## 2018-10-04 DIAGNOSIS — G4733 Obstructive sleep apnea (adult) (pediatric): Principal | ICD-10-CM

## 2018-10-04 DIAGNOSIS — E119 Type 2 diabetes mellitus without complications: Principal | ICD-10-CM

## 2018-10-04 DIAGNOSIS — I864 Gastric varices: Principal | ICD-10-CM

## 2018-10-04 DIAGNOSIS — R188 Other ascites: Principal | ICD-10-CM

## 2018-10-11 ENCOUNTER — Ambulatory Visit: Admit: 2018-10-11 | Discharge: 2018-10-11 | Payer: MEDICARE

## 2018-10-11 DIAGNOSIS — K7460 Unspecified cirrhosis of liver: Principal | ICD-10-CM

## 2018-10-18 ENCOUNTER — Ambulatory Visit: Admit: 2018-10-18 | Discharge: 2018-10-19 | Payer: MEDICARE

## 2018-10-18 DIAGNOSIS — I864 Gastric varices: Principal | ICD-10-CM

## 2018-10-18 DIAGNOSIS — K7460 Unspecified cirrhosis of liver: Principal | ICD-10-CM

## 2018-10-18 DIAGNOSIS — J449 Chronic obstructive pulmonary disease, unspecified: Principal | ICD-10-CM

## 2018-10-18 DIAGNOSIS — K7469 Other cirrhosis of liver: Principal | ICD-10-CM

## 2018-10-18 DIAGNOSIS — D649 Anemia, unspecified: Principal | ICD-10-CM

## 2018-10-18 DIAGNOSIS — G4733 Obstructive sleep apnea (adult) (pediatric): Principal | ICD-10-CM

## 2018-10-18 DIAGNOSIS — I272 Pulmonary hypertension, unspecified: Principal | ICD-10-CM

## 2018-10-18 DIAGNOSIS — I10 Essential (primary) hypertension: Principal | ICD-10-CM

## 2018-10-18 DIAGNOSIS — B1920 Unspecified viral hepatitis C without hepatic coma: Principal | ICD-10-CM

## 2018-10-18 DIAGNOSIS — E119 Type 2 diabetes mellitus without complications: Principal | ICD-10-CM

## 2018-10-18 DIAGNOSIS — Z9989 Dependence on other enabling machines and devices: Principal | ICD-10-CM

## 2018-10-18 DIAGNOSIS — Z66 Do not resuscitate: Principal | ICD-10-CM

## 2018-10-18 DIAGNOSIS — C229 Malignant neoplasm of liver, not specified as primary or secondary: Principal | ICD-10-CM

## 2018-10-18 DIAGNOSIS — D509 Iron deficiency anemia, unspecified: Principal | ICD-10-CM

## 2018-10-18 DIAGNOSIS — R188 Other ascites: Principal | ICD-10-CM

## 2018-10-24 ENCOUNTER — Telehealth: Payer: Self-pay | Admitting: *Deleted

## 2018-10-25 ENCOUNTER — Ambulatory Visit: Admit: 2018-10-25 | Discharge: 2018-10-26 | Payer: MEDICARE

## 2018-10-25 DIAGNOSIS — N183 Chronic kidney disease, stage 3 (moderate): Principal | ICD-10-CM

## 2018-10-25 DIAGNOSIS — D509 Iron deficiency anemia, unspecified: Principal | ICD-10-CM

## 2018-10-25 DIAGNOSIS — K7469 Other cirrhosis of liver: Principal | ICD-10-CM

## 2018-10-25 DIAGNOSIS — N390 Urinary tract infection, site not specified: Principal | ICD-10-CM

## 2018-10-25 DIAGNOSIS — E119 Type 2 diabetes mellitus without complications: Principal | ICD-10-CM

## 2018-10-25 DIAGNOSIS — K7460 Unspecified cirrhosis of liver: Principal | ICD-10-CM

## 2018-10-25 DIAGNOSIS — C229 Malignant neoplasm of liver, not specified as primary or secondary: Principal | ICD-10-CM

## 2018-10-25 DIAGNOSIS — D631 Anemia in chronic kidney disease: Principal | ICD-10-CM

## 2018-10-25 DIAGNOSIS — R188 Other ascites: Principal | ICD-10-CM

## 2018-10-25 DIAGNOSIS — I272 Pulmonary hypertension, unspecified: Principal | ICD-10-CM

## 2018-10-25 DIAGNOSIS — Z66 Do not resuscitate: Principal | ICD-10-CM

## 2018-10-25 DIAGNOSIS — I10 Essential (primary) hypertension: Principal | ICD-10-CM

## 2018-10-25 DIAGNOSIS — I864 Gastric varices: Principal | ICD-10-CM

## 2018-10-25 DIAGNOSIS — B1920 Unspecified viral hepatitis C without hepatic coma: Principal | ICD-10-CM

## 2018-10-25 DIAGNOSIS — G4733 Obstructive sleep apnea (adult) (pediatric): Principal | ICD-10-CM

## 2018-10-25 DIAGNOSIS — J449 Chronic obstructive pulmonary disease, unspecified: Principal | ICD-10-CM

## 2018-10-25 DIAGNOSIS — Z9989 Dependence on other enabling machines and devices: Principal | ICD-10-CM

## 2018-10-25 MED ORDER — LEVOFLOXACIN 500 MG TABLET
ORAL_TABLET | Freq: Every day | ORAL | 0 refills | 0.00000 days | Status: CP
Start: 2018-10-25 — End: 2019-01-26

## 2018-10-26 ENCOUNTER — Other Ambulatory Visit: Payer: Self-pay

## 2018-10-26 ENCOUNTER — Ambulatory Visit: Payer: Medicare HMO | Attending: Anesthesiology | Admitting: Anesthesiology

## 2018-10-26 DIAGNOSIS — F1190 Opioid use, unspecified, uncomplicated: Secondary | ICD-10-CM

## 2018-10-26 DIAGNOSIS — G894 Chronic pain syndrome: Secondary | ICD-10-CM | POA: Diagnosis not present

## 2018-10-26 DIAGNOSIS — M25511 Pain in right shoulder: Secondary | ICD-10-CM

## 2018-10-26 DIAGNOSIS — M542 Cervicalgia: Secondary | ICD-10-CM

## 2018-10-26 DIAGNOSIS — M5136 Other intervertebral disc degeneration, lumbar region: Secondary | ICD-10-CM

## 2018-10-26 DIAGNOSIS — M545 Low back pain, unspecified: Secondary | ICD-10-CM

## 2018-10-26 DIAGNOSIS — M47816 Spondylosis without myelopathy or radiculopathy, lumbar region: Secondary | ICD-10-CM

## 2018-10-26 DIAGNOSIS — M48062 Spinal stenosis, lumbar region with neurogenic claudication: Secondary | ICD-10-CM

## 2018-10-26 MED ORDER — OXYCODONE HCL 5 MG PO TABS: 5.0000 mg | ORAL_TABLET | Freq: Four times a day (QID) | ORAL | 0 refills | Status: DC | PRN

## 2018-10-26 MED ORDER — OXYCODONE HCL 5 MG PO TABS: 5.0000 mg | ORAL_TABLET | Freq: Four times a day (QID) | ORAL | 0 refills | Status: AC | PRN

## 2018-10-26 NOTE — Progress Notes (Signed)
Virtual Visit via Telephone Note  I connected with Whitney Cline on 10/26/18 at  1:00 PM EDT by telephone and verified that I am speaking with the correct person using two identifiers.   I discussed the limitations, risks, security and privacy concerns of performing an evaluation and management service by telephone and the availability of in person appointments. I also discussed with the patient that there may be a patient responsible charge related to this service. The patient expressed understanding and agreed to proceed.   History of Present Illness: Phone conversation with Whitney Cline today.  She was last seen approximate 2 months ago and continues to have low back pain of the same quality and characteristic as previously reported.  No new changes are noted in the symptom style.  She is probably hurting in the low back right buttocks and lower back and legs.  It is mainly spasming aching gnawing pain generally better with the oxycodone she takes.  Based on review of her opioid medications and previous nursing assessment sheet she describes no changes and feels that she is continuing to do well.    Observations/Objective:   Assessment and Plan: 1.  Chronic opioid maintenance.  I have reviewed the practitioner database information and is appropriate.  Subsequently we will be refilling her medications for 1 tablet 3 times daily for April 18 and May 18.  She is scheduled to return to clinic in 2 months. 2.  Chronic pain syndrome 3.  Spinal stenosis 4.  Chronic low back pain 5.  Facet arthropathy 6.  Degenerative disc disease  Follow Up Instructions: Scheduled for return to clinic in 2 months for an evaluation.    I discussed the assessment and treatment plan with the patient. The patient was provided an opportunity to ask questions and all were answered. The patient agreed with the plan and demonstrated an understanding of the instructions.   The patient was advised to call back or  seek an in-person evaluation if the symptoms worsen or if the condition fails to improve as anticipated.  I provided 15 minutes of non-face-to-face time during this encounter.   Molli Barrows, MD

## 2018-11-01 ENCOUNTER — Ambulatory Visit: Admit: 2018-11-01 | Discharge: 2018-11-01 | Payer: MEDICARE

## 2018-11-01 DIAGNOSIS — E875 Hyperkalemia: Secondary | ICD-10-CM

## 2018-11-01 DIAGNOSIS — R188 Other ascites: Secondary | ICD-10-CM

## 2018-11-01 DIAGNOSIS — K7460 Unspecified cirrhosis of liver: Principal | ICD-10-CM

## 2018-11-08 ENCOUNTER — Institutional Professional Consult (permissible substitution): Admit: 2018-11-08 | Discharge: 2018-11-09 | Payer: MEDICARE

## 2018-11-08 ENCOUNTER — Ambulatory Visit: Admit: 2018-11-08 | Discharge: 2018-11-09 | Payer: MEDICARE

## 2018-11-08 ENCOUNTER — Ambulatory Visit: Admit: 2018-11-08 | Payer: Medicare HMO | Admitting: Ophthalmology

## 2018-11-08 DIAGNOSIS — R188 Other ascites: Secondary | ICD-10-CM

## 2018-11-08 DIAGNOSIS — K7460 Unspecified cirrhosis of liver: Principal | ICD-10-CM

## 2018-11-08 SURGERY — PHACOEMULSIFICATION, CATARACT, WITH IOL INSERTION
Anesthesia: Choice | Laterality: Right

## 2018-11-15 ENCOUNTER — Institutional Professional Consult (permissible substitution): Admit: 2018-11-15 | Discharge: 2018-11-16 | Payer: MEDICARE

## 2018-11-15 DIAGNOSIS — K7460 Unspecified cirrhosis of liver: Principal | ICD-10-CM

## 2018-11-15 DIAGNOSIS — R188 Other ascites: Secondary | ICD-10-CM

## 2018-11-17 MED ORDER — ESCITALOPRAM 5 MG TABLET
ORAL_TABLET | 3 refills | 0 days | Status: CP
Start: 2018-11-17 — End: 2019-01-02

## 2018-11-22 ENCOUNTER — Ambulatory Visit: Admit: 2018-11-22 | Discharge: 2018-11-22 | Payer: MEDICARE

## 2018-11-22 DIAGNOSIS — K7460 Unspecified cirrhosis of liver: Principal | ICD-10-CM

## 2018-11-22 DIAGNOSIS — R188 Other ascites: Secondary | ICD-10-CM

## 2018-11-29 ENCOUNTER — Ambulatory Visit: Admit: 2018-11-29 | Discharge: 2018-11-29 | Payer: MEDICARE

## 2018-11-29 DIAGNOSIS — R188 Other ascites: Secondary | ICD-10-CM

## 2018-11-29 DIAGNOSIS — K7460 Unspecified cirrhosis of liver: Principal | ICD-10-CM

## 2018-12-06 ENCOUNTER — Ambulatory Visit: Admit: 2018-12-06 | Discharge: 2018-12-06 | Payer: MEDICARE

## 2018-12-06 DIAGNOSIS — K7460 Unspecified cirrhosis of liver: Principal | ICD-10-CM

## 2018-12-06 DIAGNOSIS — R21 Rash and other nonspecific skin eruption: Secondary | ICD-10-CM

## 2018-12-06 DIAGNOSIS — R188 Other ascites: Secondary | ICD-10-CM

## 2018-12-06 MED ORDER — HYDROCORTISONE 2.5 % TOPICAL CREAM
Freq: Two times a day (BID) | TOPICAL | 4 refills | 0 days | Status: SS
Start: 2018-12-06 — End: 2019-01-26

## 2018-12-13 ENCOUNTER — Ambulatory Visit: Admit: 2018-12-13 | Discharge: 2018-12-14 | Payer: MEDICARE

## 2018-12-13 DIAGNOSIS — K7460 Unspecified cirrhosis of liver: Principal | ICD-10-CM

## 2018-12-20 ENCOUNTER — Ambulatory Visit: Admit: 2018-12-20 | Discharge: 2018-12-20 | Payer: MEDICARE

## 2018-12-20 DIAGNOSIS — K7460 Unspecified cirrhosis of liver: Principal | ICD-10-CM

## 2018-12-20 DIAGNOSIS — R188 Other ascites: Secondary | ICD-10-CM

## 2018-12-20 DIAGNOSIS — D509 Iron deficiency anemia, unspecified: Secondary | ICD-10-CM

## 2018-12-27 ENCOUNTER — Ambulatory Visit: Admit: 2018-12-27 | Discharge: 2018-12-28 | Payer: MEDICARE

## 2018-12-27 DIAGNOSIS — R188 Other ascites: Secondary | ICD-10-CM

## 2018-12-27 DIAGNOSIS — K7460 Unspecified cirrhosis of liver: Principal | ICD-10-CM

## 2019-01-02 MED ORDER — ESCITALOPRAM 5 MG TABLET
ORAL_TABLET | Freq: Every day | ORAL | 3 refills | 0 days | Status: CP
Start: 2019-01-02 — End: 2019-01-09

## 2019-01-03 ENCOUNTER — Ambulatory Visit: Admit: 2019-01-03 | Discharge: 2019-01-04 | Payer: MEDICARE

## 2019-01-03 DIAGNOSIS — K7460 Unspecified cirrhosis of liver: Principal | ICD-10-CM

## 2019-01-03 DIAGNOSIS — R188 Other ascites: Secondary | ICD-10-CM

## 2019-01-09 MED ORDER — ESCITALOPRAM 5 MG TABLET
ORAL_TABLET | Freq: Every day | ORAL | 3 refills | 0.00000 days | Status: CP
Start: 2019-01-09 — End: ?

## 2019-01-10 ENCOUNTER — Ambulatory Visit: Admit: 2019-01-10 | Discharge: 2019-01-10 | Payer: MEDICARE

## 2019-01-10 DIAGNOSIS — K7460 Unspecified cirrhosis of liver: Principal | ICD-10-CM

## 2019-01-10 DIAGNOSIS — R188 Other ascites: Secondary | ICD-10-CM

## 2019-01-15 ENCOUNTER — Emergency Department
Admission: EM | Admit: 2019-01-15 | Discharge: 2019-01-15 | Disposition: A | Discharge status: Home / Self Care | Payer: Medicare HMO | Attending: Student in an Organized Health Care Education/Training Program | Admitting: Student in an Organized Health Care Education/Training Program

## 2019-01-15 ENCOUNTER — Other Ambulatory Visit: Payer: Self-pay

## 2019-01-15 ENCOUNTER — Emergency Department: Payer: Medicare HMO

## 2019-01-15 DIAGNOSIS — Z9889 Other specified postprocedural states: Secondary | ICD-10-CM

## 2019-01-15 DIAGNOSIS — I509 Heart failure, unspecified: Secondary | ICD-10-CM | POA: Diagnosis not present

## 2019-01-15 DIAGNOSIS — I130 Hypertensive heart and chronic kidney disease with heart failure and stage 1 through stage 4 chronic kidney disease, or unspecified chronic kidney disease: Secondary | ICD-10-CM | POA: Insufficient documentation

## 2019-01-15 DIAGNOSIS — Y9301 Activity, walking, marching and hiking: Secondary | ICD-10-CM | POA: Diagnosis not present

## 2019-01-15 DIAGNOSIS — Z794 Long term (current) use of insulin: Secondary | ICD-10-CM | POA: Diagnosis not present

## 2019-01-15 DIAGNOSIS — Y92003 Bedroom of unspecified non-institutional (private) residence as the place of occurrence of the external cause: Secondary | ICD-10-CM | POA: Insufficient documentation

## 2019-01-15 DIAGNOSIS — W19XXXA Unspecified fall, initial encounter: Secondary | ICD-10-CM

## 2019-01-15 DIAGNOSIS — Z7982 Long term (current) use of aspirin: Secondary | ICD-10-CM | POA: Insufficient documentation

## 2019-01-15 DIAGNOSIS — Z87891 Personal history of nicotine dependence: Secondary | ICD-10-CM | POA: Insufficient documentation

## 2019-01-15 DIAGNOSIS — S52531A Colles' fracture of right radius, initial encounter for closed fracture: Secondary | ICD-10-CM | POA: Diagnosis not present

## 2019-01-15 DIAGNOSIS — S6991XA Unspecified injury of right wrist, hand and finger(s), initial encounter: Secondary | ICD-10-CM | POA: Diagnosis present

## 2019-01-15 DIAGNOSIS — N189 Chronic kidney disease, unspecified: Secondary | ICD-10-CM | POA: Insufficient documentation

## 2019-01-15 DIAGNOSIS — W010XXA Fall on same level from slipping, tripping and stumbling without subsequent striking against object, initial encounter: Secondary | ICD-10-CM | POA: Insufficient documentation

## 2019-01-15 DIAGNOSIS — E1122 Type 2 diabetes mellitus with diabetic chronic kidney disease: Secondary | ICD-10-CM | POA: Insufficient documentation

## 2019-01-15 DIAGNOSIS — S0990XA Unspecified injury of head, initial encounter: Secondary | ICD-10-CM | POA: Diagnosis not present

## 2019-01-15 DIAGNOSIS — Y999 Unspecified external cause status: Secondary | ICD-10-CM | POA: Diagnosis not present

## 2019-01-15 LAB — COMPREHENSIVE METABOLIC PANEL
ALT: 30 U/L (ref 0–44)
AST: 47 U/L — ABNORMAL HIGH (ref 15–41)
Albumin: 3.8 g/dL (ref 3.5–5.0)
Alkaline Phosphatase: 90 U/L (ref 38–126)
Anion gap: 9 (ref 5–15)
BUN: 72 mg/dL — ABNORMAL HIGH (ref 8–23)
CO2: 23 mmol/L (ref 22–32)
Calcium: 8.9 mg/dL (ref 8.9–10.3)
Chloride: 107 mmol/L (ref 98–111)
Creatinine, Ser: 2.78 mg/dL — ABNORMAL HIGH (ref 0.44–1.00)
GFR calc Af Amer: 19 mL/min — ABNORMAL LOW (ref >60)
GFR calc non Af Amer: 17 mL/min — ABNORMAL LOW (ref >60)
Glucose, Bld: 167 mg/dL — ABNORMAL HIGH (ref 70–99)
Potassium: 4.5 mmol/L (ref 3.5–5.1)
Sodium: 139 mmol/L (ref 135–145)
Total Bilirubin: 0.8 mg/dL (ref 0.3–1.2)
Total Protein: 7.2 g/dL (ref 6.5–8.1)

## 2019-01-15 LAB — AMMONIA: Ammonia: 19 umol/L (ref 9–35)

## 2019-01-15 LAB — PROTIME-INR
INR: 1.2 (ref 0.8–1.2)
Prothrombin Time: 15 seconds (ref 11.4–15.2)

## 2019-01-15 LAB — CBC WITH DIFFERENTIAL/PLATELET
Abs Immature Granulocytes: 0.01 10*3/uL (ref 0.00–0.07)
Basophils Absolute: 0 10*3/uL (ref 0.0–0.1)
Basophils Relative: 1 %
Eosinophils Absolute: 0.5 10*3/uL (ref 0.0–0.5)
Eosinophils Relative: 14 %
HCT: 27.8 % — ABNORMAL LOW (ref 36.0–46.0)
Hemoglobin: 8.1 g/dL — ABNORMAL LOW (ref 12.0–15.0)
Immature Granulocytes: 0 %
Lymphocytes Relative: 7 %
Lymphs Abs: 0.3 10*3/uL — ABNORMAL LOW (ref 0.7–4.0)
MCH: 26.7 pg (ref 26.0–34.0)
MCHC: 29.1 g/dL — ABNORMAL LOW (ref 30.0–36.0)
MCV: 91.7 fL (ref 80.0–100.0)
Monocytes Absolute: 0.2 10*3/uL (ref 0.1–1.0)
Monocytes Relative: 4 %
Neutro Abs: 2.9 10*3/uL (ref 1.7–7.7)
Neutrophils Relative %: 74 %
Platelets: 68 10*3/uL — ABNORMAL LOW (ref 150–400)
RBC: 3.03 MIL/uL — ABNORMAL LOW (ref 3.87–5.11)
RDW: 14.6 % (ref 11.5–15.5)
WBC: 3.9 10*3/uL — ABNORMAL LOW (ref 4.0–10.5)
nRBC: 0 % (ref 0.0–0.2)

## 2019-01-15 LAB — TROPONIN I: Troponin I: <0.03 ng/mL (ref <0.03)

## 2019-01-15 MED ORDER — BUPIVACAINE HCL (PF) 0.5 % IJ SOLN
30.0000 mL | Freq: Once | INTRAMUSCULAR | Status: AC
  Administered 2019-01-15: 14:00:00 30 mL
  Filled 2019-01-15: qty 30

## 2019-01-15 MED ORDER — FENTANYL CITRATE (PF) 100 MCG/2ML IJ SOLN
50.0000 ug | INTRAMUSCULAR | Status: DC | PRN
  Administered 2019-01-15: 14:00:00 50 ug via INTRAVENOUS
  Filled 2019-01-15: qty 2

## 2019-01-15 NOTE — ED Notes (Signed)
Patient transported to X-ray 

## 2019-01-15 NOTE — ED Triage Notes (Signed)
Pt arrives via ems from home where she lives with son. Ems reports mechanical fall from standing. Pt states she hit back of head on door when falling denies any LOC. Ems report rt arm deformity, braced upon arrival to ed. EMS administered 75 mcg fentanyl prior to arrival. Pt a&o x 4 on arrival. NAD noted at this time

## 2019-01-15 NOTE — ED Provider Notes (Signed)
[facility] Emergency Department Provider Note    First MD Initiated Contact with Patient 01/15/19 1037     (approximate)  I have reviewed the triage vital signs and the nursing notes.   HISTORY  Chief Complaint Fall    HPI Whitney Cline is a 73 y.o. female presents the ER for evaluation of fall.  States she is getting up out of bed going to the bathroom after she was leaving the bathroom going to which make her bed she fell hitting the back of her head and neck as well as right forearm.  Was able to get up without assistance.  She does not remember having any chest pain shortness of breath palpitations lasting lites or symptoms prior to her fall.  She thinks that she tripped but cannot remember completely.  States that she is having mild to moderate pain in her right wrist at this time.    Past Medical History:  Diagnosis Date  . Anemia   . Arthritis   . Asthma   . Cancer [facility])    liver ca- had ablation cancer free now  . CHF (congestive heart failure) ([place])   . Chronic kidney disease   . COPD (chronic obstructive pulmonary disease) ([place])   . Diabetes mellitus without complication ([place])   . Encounter for blood transfusion   . Hepatitis C   . Hypertension   . Kidney infection    8/19  . Obesity   . Oxygen deficiency   . Shingles    2017  . Sleep apnea    Family History  Problem Relation Age of Onset  . Heart disease Mother   . Diabetes Father   . Heart disease Brother    Past Surgical History:  Procedure Laterality Date  . ABDOMINAL HYSTERECTOMY    . CORONARY ANGIOPLASTY WITH STENT PLACEMENT N/A 2007   There are no active problems to display for this patient.     Prior to Admission medications   Medication Sig Start Date End Date Taking? Authorizing Provider  ACCU-CHEK AVIVA PLUS test strip USE QID UTD 04/19/16   [provider]  albuterol (PROVENTIL HFA;VENTOLIN HFA) 108 (90 Base) MCG/ACT inhaler Inhale 2 puffs into  the lungs every 6 (six) hours as needed for wheezing or shortness of breath.    [provider]  albuterol (PROVENTIL) (2.5 MG/3ML) 0.083% nebulizer solution VVN Q 6 H PRF WHZ 04/28/16   [provider]  azelastine (ASTELIN) 0.1 % nasal spray Place into the nose. 04/28/16 09/06/18  [provider]  Blood Glucose Monitoring Suppl (FIFTY50 GLUCOSE METER 2.0) w/Device KIT Use as directed. E11.9 08/28/14   [provider]  budesonide-formoterol (SYMBICORT) 160-4.5 MCG/ACT inhaler Inhale 2 puffs into the lungs 2 (two) times daily.    [provider]  carisoprodol (SOMA) 350 MG tablet Take 1 tablet (350 mg total) by mouth 4 (four) times daily. 01/03/17   Molli Barrows, MD  carvedilol (COREG) 3.125 MG tablet Take 3.125 mg by mouth daily.     [provider]  cephALEXin (KEFLEX) 250 MG capsule Take by mouth 4 (four) times daily.    [provider]  cetirizine (ZYRTEC) 10 MG tablet Take 10 mg by mouth daily.    [provider]  cholecalciferol (VITAMIN D) 400 units TABS tablet Take 400 Units by mouth.    [provider]  cyclobenzaprine (FLEXERIL) 5 MG tablet TAKE 1 TABLET(5 MG) BY MOUTH THREE TIMES DAILY AS NEEDED FOR  MUSCLE SPASMS Patient not taking: Reported on 08/09/2017 04/20/17   Molli Barrows, MD  ferrous sulfate 325 (65 FE) MG EC tablet Take by mouth. 03/19/16 07/13/18  [provider]  fluconazole (DIFLUCAN) 100 MG tablet  12/07/17   [provider]  furosemide (LASIX) 20 MG tablet Take 10 mg by mouth daily.     [provider]  gabapentin (NEURONTIN) 300 MG capsule Take 300 mg by mouth at bedtime.     [provider]  glucose blood test strip USE FOUR TIMES DAILY AS INSTRUCTED 04/19/16   [provider]  hydrOXYzine (ATARAX/VISTARIL) 25 MG tablet Take by mouth. 05/19/16   [provider]  Insulin Pen Needle (PEN NEEDLES) 32G X 4 MM MISC Use for injections humalog TID times  daily,  then glargine daily accucheck viva machine 01/02/16   [provider]  lactulose, encephalopathy, (GENERLAC) 10 GM/15ML SOLN Take 30 g by mouth daily as needed.    [provider]  Lancets (ACCU-CHEK MULTICLIX) lancets USE ONCE D AS INSTRUCTED 06/05/16   [provider]  lidocaine (XYLOCAINE) 5 % ointment Apply topically. 06/29/16   [provider]  liraglutide (VICTOZA) 18 MG/3ML SOPN 1.8 mg. 12/06/14   [provider]  metFORMIN (GLUCOPHAGE) 500 MG tablet Take 1,000 mg by mouth 2 (two) times daily with a meal.    [provider]  methocarbamol (ROBAXIN) 750 MG tablet Take 1 tablet (750 mg total) by mouth 3 (three) times daily. Patient not taking: Reported on 03/21/2018 01/19/18   Molli Barrows, MD  naloxone [facility]) 0.4 MG/ML injection Inject 0.4 mg into the vein as needed.    [provider]  Naloxone HCl 0.4 MG/0.4ML SOAJ Inject 1 Units as directed once daily as needed. 07/11/13   [provider]  oxyCODONE (ROXICODONE) 5 MG immediate release tablet Take 1 tablet (5 mg total) by mouth every 6 (six) hours as needed for up to 30 days for severe pain. 12/18/18 01/17/19  Molli Barrows, MD  pantoprazole (PROTONIX) 40 MG tablet  12/20/17   [provider]  rifaximin (XIFAXAN) 550 MG TABS tablet Take 550 mg by mouth 2 (two) times daily.    [provider]  tiotropium (SPIRIVA) 18 MCG inhalation capsule Place 18 mcg into inhaler and inhale daily.    [provider]    Allergies Dexamethasone sodium phosphate    Social History Social History   Tobacco Use  . Smoking status: Former Smoker    Types: Cigarettes    Quit date: 06/22/2011    Years since quitting: 7.5  . Smokeless tobacco: Never Used  Substance Use Topics  . Alcohol use: No    Alcohol/week: 0.0 standard drinks  . Drug use: No    Review of Systems Patient denies headaches, rhinorrhea, blurry vision, numbness, shortness of  breath, chest pain, edema, cough, abdominal pain, nausea, vomiting, diarrhea, dysuria, fevers, rashes or hallucinations unless otherwise stated above in HPI. ____________________________________________   PHYSICAL EXAM:  VITAL SIGNS: Vitals:   01/15/19 1041  Pulse: 62  Resp: 16  Temp: 98 F (36.7 C)  SpO2: 100%    Constitutional: Alert and oriented.  Eyes: Conjunctivae are normal.  Head: Atraumatic. Nose: No congestion/rhinnorhea. Mouth/Throat: Mucous membranes are moist.   Neck: No stridor. Painless ROM.  Cardiovascular: Normal rate, regular rhythm. Grossly normal heart sounds.  Good peripheral circulation. Respiratory: Normal respiratory effort.  No retractions. Lungs CTAB. Gastrointestinal: Soft and nontender. No distention. No abdominal bruits. No  CVA tenderness. Genitourinary:  Musculoskeletal: Tender to palpation of the right forearm.  + swelling and ecchymosis.  No laceration.  Neurovascular intact distally.  Also contusion to the base of the neck.  No lower extremity tenderness nor edema.  No joint effusions. Neurologic:  Normal speech and language. No gross focal neurologic deficits are appreciated. No facial droop Skin:  Skin is warm, dry and intact. No rash noted. Psychiatric: Mood and affect are normal. Speech and behavior are normal.  ____________________________________________   LABS (all labs ordered are listed, but only abnormal results are displayed)  Results for orders placed or performed during the hospital encounter of 01/15/19 (from the past 24 hour(s))  CBC with Differential/Platelet     Status: Abnormal   Collection Time: 01/15/19 10:48 AM  Result Value Ref Range   WBC 3.9 (L) 4.0 - 10.5 K/uL   RBC 3.03 (L) 3.87 - 5.11 MIL/uL   Hemoglobin 8.1 (L) 12.0 - 15.0 g/dL   HCT 27.8 (L) 36.0 - 46.0 %   MCV 91.7 80.0 - 100.0 fL   MCH 26.7 26.0 - 34.0 pg   MCHC 29.1 (L) 30.0 - 36.0 g/dL   RDW 14.6 11.5 - 15.5 %   Platelets 68 (L) 150 - 400 K/uL   nRBC 0.0  0.0 - 0.2 %   Neutrophils Relative % 74 %   Neutro Abs 2.9 1.7 - 7.7 K/uL   Lymphocytes Relative 7 %   Lymphs Abs 0.3 (L) 0.7 - 4.0 K/uL   Monocytes Relative 4 %   Monocytes Absolute 0.2 0.1 - 1.0 K/uL   Eosinophils Relative 14 %   Eosinophils Absolute 0.5 0.0 - 0.5 K/uL   Basophils Relative 1 %   Basophils Absolute 0.0 0.0 - 0.1 K/uL   Immature Granulocytes 0 %   Abs Immature Granulocytes 0.01 0.00 - 0.07 K/uL  Comprehensive metabolic panel     Status: Abnormal   Collection Time: 01/15/19 10:48 AM  Result Value Ref Range   Sodium 139 135 - 145 mmol/L   Potassium 4.5 3.5 - 5.1 mmol/L   Chloride 107 98 - 111 mmol/L   CO2 23 22 - 32 mmol/L   Glucose, Bld 167 (H) 70 - 99 mg/dL   BUN 72 (H) 8 - 23 mg/dL   Creatinine, Ser 2.78 (H) 0.44 - 1.00 mg/dL   Calcium 8.9 8.9 - 10.3 mg/dL   Total Protein 7.2 6.5 - 8.1 g/dL   Albumin 3.8 3.5 - 5.0 g/dL   AST 47 (H) 15 - 41 U/L   ALT 30 0 - 44 U/L   Alkaline Phosphatase 90 38 - 126 U/L   Total Bilirubin 0.8 0.3 - 1.2 mg/dL   GFR calc non Af Amer 17 (L) >60 mL/min   GFR calc Af Amer 19 (L) >60 mL/min   Anion gap 9 5 - 15  Troponin I - ONCE - STAT     Status: None   Collection Time: 01/15/19 10:48 AM  Result Value Ref Range   Troponin I <0.03 <0.03 ng/mL  Protime-INR     Status: None   Collection Time: 01/15/19 10:48 AM  Result Value Ref Range   Prothrombin Time 15.0 11.4 - 15.2 seconds   INR 1.2 0.8 - 1.2  Ammonia     Status: None   Collection Time: 01/15/19 12:58 PM  Result Value Ref Range   Ammonia 19 9 - 35 umol/L   ____________________________________________  EKG My review and personal interpretation at Time: 11:19  Indication: fall  Rate: 60  Rhythm: sinus Axis: left Other: normal intervals, no stemi ____________________________________________  RADIOLOGY  I personally reviewed all radiographic images ordered to evaluate for the above acute complaints and reviewed radiology reports and findings.  These findings were  personally discussed with the patient.  Please see medical record for radiology report.  ____________________________________________   PROCEDURES  Procedure(s) performed:  .Ortho Injury Treatment  Date/Time: 01/15/2019 3:17 PM Performed by: Merlyn Lot, MD Authorized by: Merlyn Lot, MD   Consent:    Consent obtained:  Verbal   Consent given by:  Patient   Risks discussed:  Fracture, irreducible dislocation, recurrent dislocation, nerve damage, stiffness, restricted joint movement and vascular damage   Alternatives discussed:  No treatment, referral, alternative treatment, immobilization and delayed treatmentInjury location: wrist Location details: right wrist Injury type: fracture Fracture type: distal radius and ulnar styloid Pre-procedure neurovascular assessment: neurovascularly intact Pre-procedure distal perfusion: normal Pre-procedure neurological function: normal Pre-procedure range of motion: reduced Anesthesia: hematoma block  Anesthesia: Local anesthesia used: yes Local Anesthetic: bupivacaine 0.5% without epinephrine Anesthetic total: 20 mL  Patient sedated: NoManipulation performed: yes Skin traction used: yes Reduction successful: yes X-ray confirmed reduction: yes Immobilization: splint Splint type: sugar tong Supplies used: Ortho-Glass Post-procedure neurovascular assessment: post-procedure neurovascularly intact Post-procedure distal perfusion: normal Post-procedure neurological function: normal Post-procedure range of motion: unchanged Patient tolerance: patient tolerated the procedure well with no immediate complications       Critical Care performed: no ____________________________________________   INITIAL IMPRESSION / ASSESSMENT AND PLAN / ED COURSE  Pertinent labs & imaging results that were available during my care of the patient were reviewed by me and considered in my medical decision making (see chart for details).    DDX: Fracture, dislocation, contusion, dehydration, lecture light abnormality, head injury, subdural, dysrhythmia  Whitney Cline is a 73 y.o. who presents to the ED with symptoms as described above.  Patient with evidence of right forearm injury.  Fall seems primarily mechanical.  Blood work will be ordered for the by differential will observe in the ER monitor evaluate for any dysrhythmia.  Doubt ACS.  No shortness of breath or respiratory failure. The patient will be placed on continuous pulse oximetry and telemetry for monitoring.  Laboratory evaluation will be sent to evaluate for the above complaints.     Clinical Course as of Jan 15 1515  Mon Jan 15, 2019  1506 Discussed fracture with Dr. Mack Guise of orthopedics.  Recommended reduction.  Distal radius fracture was performed with signicantly improved alignment.  Blood work at baseline per patient and per care-everywhere given her known cirrhosis and she has follow up with pcp and GI.  Ammonia and pt inr normal.  NAICA. She has no hypoxia on her baseline home o2.  Discussed option for further observation.  Patient declined and requested discharge home with outpatient followup which I believe is reasonable.   [PR]    Clinical Course User Index [PR] Merlyn Lot, MD    The patient was evaluated in Emergency Department today for the symptoms described in the history of present illness. He/she was evaluated in the context of the global COVID-19 pandemic, which necessitated consideration that the patient might be at risk for infection with the SARS-CoV-2 virus that causes COVID-19. Institutional protocols and algorithms that pertain to the evaluation of patients at risk for COVID-19 are in a state of rapid change based on information released by regulatory bodies including the CDC and federal and state organizations. These policies  and algorithms were followed during the patient's care in the ED.  As part of my medical decision making, I  reviewed the following data within the [place] notes reviewed and incorporated, Labs reviewed, notes from prior ED visits and [place] Controlled Substance Database   ____________________________________________   FINAL CLINICAL IMPRESSION(S) / ED DIAGNOSES  Final diagnoses:  Closed Colles' fracture of right radius, initial encounter  Fall, initial encounter      NEW MEDICATIONS STARTED DURING THIS VISIT:  New Prescriptions   No medications on file     Note:  This document was prepared using Dragon voice recognition software and may include unintentional dictation errors.    Merlyn Lot, MD 01/15/19 1520

## 2019-01-16 ENCOUNTER — Ambulatory Visit: Admit: 2019-01-16 | Discharge: 2019-01-26 | Payer: MEDICARE

## 2019-01-16 ENCOUNTER — Ambulatory Visit: Payer: Medicare HMO | Attending: Anesthesiology | Admitting: Anesthesiology

## 2019-01-16 ENCOUNTER — Encounter: Payer: Self-pay | Admitting: Anesthesiology

## 2019-01-16 ENCOUNTER — Other Ambulatory Visit: Payer: Self-pay

## 2019-01-16 DIAGNOSIS — R5383 Other fatigue: Secondary | ICD-10-CM

## 2019-01-16 DIAGNOSIS — M6281 Muscle weakness (generalized): Principal | ICD-10-CM

## 2019-01-16 DIAGNOSIS — S52614A Nondisplaced fracture of right ulna styloid process, initial encounter for closed fracture: Secondary | ICD-10-CM

## 2019-01-16 DIAGNOSIS — G894 Chronic pain syndrome: Secondary | ICD-10-CM

## 2019-01-16 DIAGNOSIS — M48062 Spinal stenosis, lumbar region with neurogenic claudication: Secondary | ICD-10-CM

## 2019-01-16 DIAGNOSIS — M48061 Spinal stenosis, lumbar region without neurogenic claudication: Secondary | ICD-10-CM | POA: Insufficient documentation

## 2019-01-16 DIAGNOSIS — F1190 Opioid use, unspecified, uncomplicated: Secondary | ICD-10-CM

## 2019-01-16 DIAGNOSIS — M5136 Other intervertebral disc degeneration, lumbar region: Secondary | ICD-10-CM

## 2019-01-16 DIAGNOSIS — M5386 Other specified dorsopathies, lumbar region: Secondary | ICD-10-CM | POA: Insufficient documentation

## 2019-01-16 DIAGNOSIS — M4802 Spinal stenosis, cervical region: Secondary | ICD-10-CM | POA: Insufficient documentation

## 2019-01-16 MED ORDER — OXYCODONE HCL 5 MG PO TABS: 5.0000 mg | ORAL_TABLET | Freq: Three times a day (TID) | ORAL | 0 refills | Status: AC

## 2019-01-16 MED ORDER — OXYCODONE HCL 5 MG PO TABS: 5.0000 mg | ORAL_TABLET | Freq: Four times a day (QID) | ORAL | 0 refills | Status: AC | PRN

## 2019-01-16 NOTE — Patient Instructions (Signed)
Virtual Visit via Telephone Note  I connected with Whitney Cline on @TODAY@ at  3:00 PM EDT by telephone and verified that I am speaking with the correct person using two identifiers.  Location: Patient: Home Provider: Pain control center   I discussed the limitations, risks, security and privacy concerns of performing an evaluation and management service by telephone and the availability of in person appointments. I also discussed with the patient that there may be a patient responsible charge related to this service. The patient expressed understanding and agreed to proceed.   History of Present Illness: I spoke with Whitney Cline today regarding her low back pain and diffuse body pain and hi  She is still doing well with her current medication regimen.  She is taking her oxycodone 5 mg 3 times a day and this is working well for her.  Based on our discussion she is continuing to get good relief from the medication with minimal side effects.  Otherwise the quality characteristic and distribution of her pain is remained unchanged.    Observations/Objective:  Current Outpatient Medications:  .  ACCU-CHEK AVIVA PLUS test strip, USE QID UTD, Disp: , Rfl: 2 .  albuterol (PROVENTIL HFA;VENTOLIN HFA) 108 (90 Base) MCG/ACT inhaler, Inhale 2 puffs into the lungs every 6 (six) hours as needed for wheezing or shortness of breath., Disp: , Rfl:  .  albuterol (PROVENTIL) (2.5 MG/3ML) 0.083% nebulizer solution, VVN Q 6 H PRF WHZ, Disp: , Rfl: 12 .  azelastine (ASTELIN) 0.1 % nasal spray, Place into the nose., Disp: , Rfl:  .  Blood Glucose Monitoring Suppl (FIFTY50 GLUCOSE METER 2.0) w/Device KIT, Use as directed. E11.9, Disp: , Rfl:  .  budesonide-formoterol (SYMBICORT) 160-4.5 MCG/ACT inhaler, Inhale 2 puffs into the lungs 2 (two) times daily., Disp: , Rfl:  .  carisoprodol (SOMA) 350 MG tablet, Take 1 tablet (350 mg total) by mouth 4 (four) times daily., Disp: 30 tablet, Rfl: 5 .  carvedilol (COREG) 3.125  MG tablet, Take 3.125 mg by mouth daily. , Disp: , Rfl:  .  cephALEXin (KEFLEX) 250 MG capsule, Take by mouth 4 (four) times daily., Disp: , Rfl:  .  cetirizine (ZYRTEC) 10 MG tablet, Take 10 mg by mouth daily., Disp: , Rfl:  .  cholecalciferol (VITAMIN D) 400 units TABS tablet, Take 400 Units by mouth., Disp: , Rfl:  .  cyclobenzaprine (FLEXERIL) 5 MG tablet, TAKE 1 TABLET(5 MG) BY MOUTH THREE TIMES DAILY AS NEEDED FOR MUSCLE SPASMS (Patient not taking: Reported on 08/09/2017), Disp: 75 tablet, Rfl: 0 .  ferrous sulfate 325 (65 FE) MG EC tablet, Take by mouth., Disp: , Rfl:  .  fluconazole (DIFLUCAN) 100 MG tablet, , Disp: , Rfl: 0 .  furosemide (LASIX) 20 MG tablet, Take 10 mg by mouth daily. , Disp: , Rfl:  .  gabapentin (NEURONTIN) 300 MG capsule, Take 300 mg by mouth at bedtime. , Disp: , Rfl:  .  glucose blood test strip, USE FOUR TIMES DAILY AS INSTRUCTED, Disp: , Rfl:  .  hydrOXYzine (ATARAX/VISTARIL) 25 MG tablet, Take by mouth., Disp: , Rfl:  .  Insulin Pen Needle (PEN NEEDLES) 32G X 4 MM MISC, Use for injections humalog TID times daily,  then glargine daily accucheck viva machine, Disp: , Rfl:  .  lactulose, encephalopathy, (GENERLAC) 10 GM/15ML SOLN, Take 30 g by mouth daily as needed., Disp: , Rfl:  .  Lancets (ACCU-CHEK MULTICLIX) lancets, USE ONCE D AS INSTRUCTED, Disp: , Rfl: 6 .    lidocaine (XYLOCAINE) 5 % ointment, Apply topically., Disp: , Rfl:  .  liraglutide (VICTOZA) 18 MG/3ML SOPN, 1.8 mg., Disp: , Rfl:  .  metFORMIN (GLUCOPHAGE) 500 MG tablet, Take 1,000 mg by mouth 2 (two) times daily with a meal., Disp: , Rfl:  .  methocarbamol (ROBAXIN) 750 MG tablet, Take 1 tablet (750 mg total) by mouth 3 (three) times daily. (Patient not taking: Reported on 03/21/2018), Disp: 90 tablet, Rfl: 2 .  naloxone (NARCAN) 0.4 MG/ML injection, Inject 0.4 mg into the vein as needed., Disp: , Rfl:  .  Naloxone HCl 0.4 MG/0.4ML SOAJ, Inject 1 Units as directed once daily as needed., Disp: , Rfl:  .   [START ON 01/17/2019] oxyCODONE (ROXICODONE) 5 MG immediate release tablet, Take 1 tablet (5 mg total) by mouth every 6 (six) hours as needed for up to 30 days for severe pain., Disp: 90 tablet, Rfl: 0 .  [START ON 02/16/2019] oxyCODONE (ROXICODONE) 5 MG immediate release tablet, Take 1 tablet (5 mg total) by mouth 3 (three) times daily for 30 days., Disp: 90 tablet, Rfl: 0 .  pantoprazole (PROTONIX) 40 MG tablet, , Disp: , Rfl: 1 .  rifaximin (XIFAXAN) 550 MG TABS tablet, Take 550 mg by mouth 2 (two) times daily., Disp: , Rfl:  .  tiotropium (SPIRIVA) 18 MCG inhalation capsule, Place 18 mcg into inhaler and inhale daily., Disp: , Rfl:   Current Facility-Administered Medications:  .  dexamethasone (DECADRON) injection 10 mg, 10 mg, Other, Once, Molli Barrows, MD .  dexamethasone (DECADRON) injection 4 mg, 4 mg, Other, Once, Molli Barrows, MD .  ropivacaine (PF) 2 mg/ml (0.2%) (NAROPIN) epidural 1 mL, 1 mL, Epidural, Once, Molli Barrows, MD .  ropivacaine (PF) 2 mg/mL (0.2%) (NAROPIN) injection 10 mL, 10 mL, Epidural, Once, Molli Barrows, MD   Assessment and Plan: 1. DDD (degenerative disc disease), lumbar   2. Low back derangement syndrome   3. Chronic pain syndrome   4. Chronic, continuous use of opioids   5. Spinal stenosis in cervical region   6. Spinal stenosis of lumbar region with neurogenic claudication   Based on our discussion today and upon review of the [facility] practitioner database information I am going to refill her medications for the next 2 months.  This will be dated for June 17 and July 17 for her oxycodone.  She has been instructed to continue with stretching exercises and return to clinic in 2 months for return follow-up.  Also she is to continue follow-up with her primary care physician for baseline medical care.  Follow Up Instructions:    I discussed the assessment and treatment plan with the patient. The patient was provided an opportunity to ask questions  and all were answered. The patient agreed with the plan and demonstrated an understanding of the instructions.   The patient was advised to call back or seek an in-person evaluation if the symptoms worsen or if the condition fails to improve as anticipated.  I provided 30 minutes of non-face-to-face time during this encounter.   Molli Barrows, MD

## 2019-01-18 NOTE — Progress Notes (Signed)
I made several efforts to contact Whitney Cline 2 days ago and today via telephone for her home phone.  I was unable to get through.  Dr. Andree Elk

## 2019-01-23 DIAGNOSIS — M6281 Muscle weakness (generalized): Principal | ICD-10-CM

## 2019-01-23 DIAGNOSIS — S52614A Nondisplaced fracture of right ulna styloid process, initial encounter for closed fracture: Secondary | ICD-10-CM

## 2019-01-23 DIAGNOSIS — R5383 Other fatigue: Secondary | ICD-10-CM

## 2019-01-24 DIAGNOSIS — R5383 Other fatigue: Secondary | ICD-10-CM

## 2019-01-24 DIAGNOSIS — M6281 Muscle weakness (generalized): Principal | ICD-10-CM

## 2019-01-24 DIAGNOSIS — S52614A Nondisplaced fracture of right ulna styloid process, initial encounter for closed fracture: Secondary | ICD-10-CM

## 2019-01-26 MED ORDER — LACTULOSE 20 GRAM ORAL PACKET
PACK | Freq: Three times a day (TID) | ORAL | 0 refills | 0.00000 days
Start: 2019-01-26 — End: 2019-03-14

## 2019-01-26 MED ORDER — EMOLLIENT COMBINATION NO.108 LOTION
Freq: Every day | TOPICAL | 0 refills | 0.00000 days | PRN
Start: 2019-01-26 — End: ?

## 2019-01-26 MED ORDER — LIDOCAINE 5 % TOPICAL PATCH
MEDICATED_PATCH | Freq: Every day | TRANSDERMAL | 0 refills | 0 days
Start: 2019-01-26 — End: 2019-03-07

## 2019-01-26 MED ORDER — CHOLECALCIFEROL (VITAMIN D3) 25 MCG (1,000 UNIT) TABLET
ORAL_TABLET | Freq: Every day | ORAL | 11 refills | 0 days
Start: 2019-01-26 — End: 2020-01-26

## 2019-01-26 MED ORDER — SENNOSIDES 8.6 MG TABLET
ORAL_TABLET | Freq: Every evening | ORAL | 0 refills | 0.00000 days
Start: 2019-01-26 — End: 2019-02-25

## 2019-01-26 MED ORDER — GABAPENTIN 100 MG CAPSULE
ORAL_CAPSULE | Freq: Every evening | ORAL | 0 refills | 0 days
Start: 2019-01-26 — End: 2019-03-14

## 2019-01-26 MED ORDER — HYDROCORTISONE 2.5 % TOPICAL CREAM
Freq: Two times a day (BID) | TOPICAL | 4 refills | 0.00000 days
Start: 2019-01-26 — End: 2019-03-07

## 2019-01-26 MED ORDER — ACETAMINOPHEN 325 MG TABLET
Freq: Four times a day (QID) | ORAL | 0 refills | 0 days | PRN
Start: 2019-01-26 — End: ?

## 2019-01-26 MED ORDER — HYDROXYZINE HCL 10 MG TABLET
ORAL_TABLET | Freq: Four times a day (QID) | ORAL | 0 refills | 0 days | PRN
Start: 2019-01-26 — End: 2019-02-21

## 2019-01-26 MED ORDER — ONDANSETRON HCL 4 MG TABLET
Freq: Three times a day (TID) | ORAL | 0 refills | 0.00000 days | PRN
Start: 2019-01-26 — End: 2019-02-02

## 2019-01-26 MED ORDER — FUROSEMIDE 20 MG TABLET
ORAL_TABLET | Freq: Every day | ORAL | 0 refills | 0 days
Start: 2019-01-26 — End: 2019-02-28

## 2019-01-26 MED ORDER — MENTHOL LOZENGES WRAPPER
ORAL | 0 refills | 0.00000 days | PRN
Start: 2019-01-26 — End: ?

## 2019-01-27 MED ORDER — POLYETHYLENE GLYCOL 3350 17 GRAM ORAL POWDER PACKET
PACK | Freq: Every day | ORAL | 0 refills | 0 days
Start: 2019-01-27 — End: 2019-02-26

## 2019-01-31 ENCOUNTER — Ambulatory Visit: Admit: 2019-01-31 | Discharge: 2019-02-01 | Payer: MEDICARE

## 2019-01-31 DIAGNOSIS — K7460 Unspecified cirrhosis of liver: Principal | ICD-10-CM

## 2019-01-31 DIAGNOSIS — R188 Other ascites: Secondary | ICD-10-CM

## 2019-02-07 ENCOUNTER — Ambulatory Visit: Admit: 2019-02-07 | Discharge: 2019-02-07 | Payer: MEDICARE

## 2019-02-07 DIAGNOSIS — K7460 Unspecified cirrhosis of liver: Principal | ICD-10-CM

## 2019-02-07 DIAGNOSIS — R188 Other ascites: Secondary | ICD-10-CM

## 2019-02-08 ENCOUNTER — Ambulatory Visit: Admit: 2019-02-08 | Discharge: 2019-02-09 | Payer: MEDICARE

## 2019-02-08 DIAGNOSIS — S52571A Other intraarticular fracture of lower end of right radius, initial encounter for closed fracture: Principal | ICD-10-CM

## 2019-02-08 DIAGNOSIS — S6991XA Unspecified injury of right wrist, hand and finger(s), initial encounter: Principal | ICD-10-CM

## 2019-02-14 ENCOUNTER — Ambulatory Visit: Admit: 2019-02-14 | Discharge: 2019-02-15 | Payer: MEDICARE

## 2019-02-14 DIAGNOSIS — K7460 Unspecified cirrhosis of liver: Principal | ICD-10-CM

## 2019-02-14 DIAGNOSIS — R188 Other ascites: Secondary | ICD-10-CM

## 2019-02-21 ENCOUNTER — Ambulatory Visit: Admit: 2019-02-21 | Discharge: 2019-02-22 | Payer: MEDICARE

## 2019-02-21 DIAGNOSIS — R51 Headache: Secondary | ICD-10-CM

## 2019-02-21 DIAGNOSIS — K7460 Unspecified cirrhosis of liver: Principal | ICD-10-CM

## 2019-02-21 DIAGNOSIS — R188 Other ascites: Secondary | ICD-10-CM

## 2019-02-21 MED ORDER — HYDROXYZINE HCL 25 MG TABLET
ORAL_TABLET | Freq: Three times a day (TID) | ORAL | 4 refills | 30 days | Status: CP | PRN
Start: 2019-02-21 — End: ?

## 2019-02-28 ENCOUNTER — Ambulatory Visit: Admit: 2019-02-28 | Discharge: 2019-03-01 | Payer: MEDICARE

## 2019-02-28 DIAGNOSIS — R188 Other ascites: Principal | ICD-10-CM

## 2019-03-02 ENCOUNTER — Ambulatory Visit: Admit: 2019-03-02 | Discharge: 2019-03-03 | Payer: MEDICARE

## 2019-03-02 DIAGNOSIS — S52611D Displaced fracture of right ulna styloid process, subsequent encounter for closed fracture with routine healing: Secondary | ICD-10-CM

## 2019-03-02 DIAGNOSIS — S52571D Other intraarticular fracture of lower end of right radius, subsequent encounter for closed fracture with routine healing: Principal | ICD-10-CM

## 2019-03-02 DIAGNOSIS — S52571A Other intraarticular fracture of lower end of right radius, initial encounter for closed fracture: Principal | ICD-10-CM

## 2019-03-07 ENCOUNTER — Ambulatory Visit: Admit: 2019-03-07 | Discharge: 2019-03-08 | Payer: MEDICARE

## 2019-03-07 DIAGNOSIS — K7460 Unspecified cirrhosis of liver: Principal | ICD-10-CM

## 2019-03-07 DIAGNOSIS — R188 Other ascites: Secondary | ICD-10-CM

## 2019-03-07 DIAGNOSIS — D509 Iron deficiency anemia, unspecified: Secondary | ICD-10-CM

## 2019-03-09 ENCOUNTER — Other Ambulatory Visit
Admission: RE | Admit: 2019-03-09 | Discharge: 2019-03-09 | Disposition: A | Discharge status: Home / Self Care | Payer: Medicare HMO | Source: Ambulatory Visit | Attending: Ophthalmology | Admitting: Ophthalmology

## 2019-03-09 ENCOUNTER — Other Ambulatory Visit: Payer: Self-pay

## 2019-03-09 DIAGNOSIS — Z01812 Encounter for preprocedural laboratory examination: Secondary | ICD-10-CM | POA: Diagnosis present

## 2019-03-09 DIAGNOSIS — Z20828 Contact with and (suspected) exposure to other viral communicable diseases: Secondary | ICD-10-CM | POA: Insufficient documentation

## 2019-03-09 DIAGNOSIS — H2511 Age-related nuclear cataract, right eye: Secondary | ICD-10-CM | POA: Diagnosis not present

## 2019-03-10 LAB — SARS CORONAVIRUS 2 (TAT 6-24 HRS): SARS Coronavirus 2: NEGATIVE

## 2019-03-13 ENCOUNTER — Ambulatory Visit: Payer: Medicare HMO | Admitting: Certified Registered"

## 2019-03-13 ENCOUNTER — Encounter
Admission: RE | Disposition: A | Discharge status: Home / Self Care | Payer: Self-pay | Source: Home / Self Care | Attending: Ophthalmology

## 2019-03-13 ENCOUNTER — Encounter: Payer: Self-pay | Admitting: Emergency Medicine

## 2019-03-13 ENCOUNTER — Ambulatory Visit
Admission: RE | Admit: 2019-03-13 | Discharge: 2019-03-13 | Disposition: A | Discharge status: Home / Self Care | Payer: Medicare HMO | Attending: Ophthalmology | Admitting: Ophthalmology

## 2019-03-13 ENCOUNTER — Other Ambulatory Visit: Payer: Self-pay

## 2019-03-13 DIAGNOSIS — H2511 Age-related nuclear cataract, right eye: Secondary | ICD-10-CM | POA: Diagnosis not present

## 2019-03-13 DIAGNOSIS — J449 Chronic obstructive pulmonary disease, unspecified: Secondary | ICD-10-CM | POA: Insufficient documentation

## 2019-03-13 DIAGNOSIS — F329 Major depressive disorder, single episode, unspecified: Secondary | ICD-10-CM | POA: Insufficient documentation

## 2019-03-13 DIAGNOSIS — Z87891 Personal history of nicotine dependence: Secondary | ICD-10-CM | POA: Insufficient documentation

## 2019-03-13 DIAGNOSIS — K7460 Unspecified cirrhosis of liver: Secondary | ICD-10-CM | POA: Insufficient documentation

## 2019-03-13 DIAGNOSIS — Z7951 Long term (current) use of inhaled steroids: Secondary | ICD-10-CM | POA: Diagnosis not present

## 2019-03-13 DIAGNOSIS — G4730 Sleep apnea, unspecified: Secondary | ICD-10-CM | POA: Insufficient documentation

## 2019-03-13 DIAGNOSIS — M1990 Unspecified osteoarthritis, unspecified site: Secondary | ICD-10-CM | POA: Insufficient documentation

## 2019-03-13 DIAGNOSIS — Z8505 Personal history of malignant neoplasm of liver: Secondary | ICD-10-CM | POA: Diagnosis not present

## 2019-03-13 DIAGNOSIS — I509 Heart failure, unspecified: Secondary | ICD-10-CM | POA: Insufficient documentation

## 2019-03-13 DIAGNOSIS — I110 Hypertensive heart disease with heart failure: Secondary | ICD-10-CM | POA: Diagnosis not present

## 2019-03-13 DIAGNOSIS — I2510 Atherosclerotic heart disease of native coronary artery without angina pectoris: Secondary | ICD-10-CM | POA: Diagnosis not present

## 2019-03-13 DIAGNOSIS — Z9981 Dependence on supplemental oxygen: Secondary | ICD-10-CM | POA: Diagnosis not present

## 2019-03-13 DIAGNOSIS — Z79899 Other long term (current) drug therapy: Secondary | ICD-10-CM | POA: Diagnosis not present

## 2019-03-13 HISTORY — DX: Dyspnea, unspecified: R06.00

## 2019-03-13 HISTORY — DX: Depression, unspecified: F32.A

## 2019-03-13 HISTORY — DX: Pneumonia, unspecified organism: J18.9

## 2019-03-13 HISTORY — PX: CATARACT EXTRACTION EXTRACAPSULAR: SHX1305

## 2019-03-13 HISTORY — DX: Angina pectoris, unspecified: I20.9

## 2019-03-13 HISTORY — DX: Atherosclerotic heart disease of native coronary artery without angina pectoris: I25.10

## 2019-03-13 LAB — GLUCOSE, CAPILLARY
Glucose-Capillary: 167 mg/dL — ABNORMAL HIGH (ref 70–99)
Glucose-Capillary: 171 mg/dL — ABNORMAL HIGH (ref 70–99)

## 2019-03-13 SURGERY — EXTRACTION, CATARACT, WITH IOL INSERTION
Anesthesia: Monitor Anesthesia Care | Site: Eye | Laterality: Right

## 2019-03-13 MED ORDER — FENTANYL CITRATE (PF) 100 MCG/2ML IJ SOLN: 25.0000 ug | INTRAMUSCULAR | Status: DC | PRN

## 2019-03-13 MED ORDER — CARBACHOL 0.01 % IO SOLN
INTRAOCULAR | Status: DC | PRN
  Administered 2019-03-13: .5 mL via INTRAOCULAR

## 2019-03-13 MED ORDER — EPINEPHRINE PF 1 MG/ML IJ SOLN
INTRAOCULAR | Status: DC | PRN
  Administered 2019-03-13: 09:00:00 1 mL via OPHTHALMIC

## 2019-03-13 MED ORDER — NEOMYCIN-POLYMYXIN-DEXAMETH 0.1 % OP OINT
TOPICAL_OINTMENT | OPHTHALMIC | Status: DC | PRN
  Administered 2019-03-13: 1 via OPHTHALMIC

## 2019-03-13 MED ORDER — ARMC OPHTHALMIC DILATING DROPS
1.0000 "application " | OPHTHALMIC | Status: AC
  Administered 2019-03-13 (×3): 1 via OPHTHALMIC

## 2019-03-13 MED ORDER — TETRACAINE HCL 0.5 % OP SOLN
1.0000 [drp] | OPHTHALMIC | Status: DC | PRN
  Administered 2019-03-13: 08:00:00 1 [drp] via OPHTHALMIC

## 2019-03-13 MED ORDER — MIDAZOLAM HCL 2 MG/2ML IJ SOLN
INTRAMUSCULAR | Status: AC
  Filled 2019-03-13: qty 2

## 2019-03-13 MED ORDER — ARMC OPHTHALMIC DILATING DROPS
OPHTHALMIC | Status: AC
  Administered 2019-03-13: 1 via OPHTHALMIC
  Filled 2019-03-13: qty 0.5

## 2019-03-13 MED ORDER — POVIDONE-IODINE 5 % OP SOLN
OPHTHALMIC | Status: DC | PRN
  Administered 2019-03-13: 1 via OPHTHALMIC

## 2019-03-13 MED ORDER — MOXIFLOXACIN HCL 0.5 % OP SOLN
OPHTHALMIC | Status: AC
  Administered 2019-03-13: 08:00:00 1 [drp] via OPHTHALMIC
  Filled 2019-03-13: qty 3

## 2019-03-13 MED ORDER — NA HYALUR & NA CHOND-NA HYALUR 0.4-0.35 ML IO KIT
PACK | INTRAOCULAR | Status: DC | PRN
  Administered 2019-03-13: 1 mL via INTRAOCULAR

## 2019-03-13 MED ORDER — MIDAZOLAM HCL 2 MG/2ML IJ SOLN
INTRAMUSCULAR | Status: DC | PRN
  Administered 2019-03-13 (×2): 1 mg via INTRAVENOUS

## 2019-03-13 MED ORDER — MOXIFLOXACIN HCL 0.5 % OP SOLN
1.0000 [drp] | OPHTHALMIC | Status: DC | PRN
  Administered 2019-03-13 (×3): 1 [drp] via OPHTHALMIC

## 2019-03-13 MED ORDER — LIDOCAINE HCL (PF) 4 % IJ SOLN
INTRAOCULAR | Status: DC | PRN
  Administered 2019-03-13: 2.25 mL via OPHTHALMIC

## 2019-03-13 MED ORDER — SODIUM CHLORIDE 0.9 % IV SOLN
INTRAVENOUS | Status: DC
  Administered 2019-03-13: 08:00:00 via INTRAVENOUS

## 2019-03-13 MED ORDER — ONDANSETRON HCL 4 MG/2ML IJ SOLN: 4.0000 mg | Freq: Once | INTRAMUSCULAR | Status: DC | PRN

## 2019-03-13 MED ORDER — TETRACAINE HCL 0.5 % OP SOLN
OPHTHALMIC | Status: AC
  Administered 2019-03-13: 1 [drp] via OPHTHALMIC
  Filled 2019-03-13: qty 4

## 2019-03-13 SURGICAL SUPPLY — 18 items
GLOVE BIO SURGEON STRL SZ8 (GLOVE) ×3 IMPLANT
GLOVE BIOGEL M 6.5 STRL (GLOVE) ×3 IMPLANT
GLOVE SURG LX 7.5 STRW (GLOVE) ×2
GLOVE SURG LX STRL 7.5 STRW (GLOVE) ×1 IMPLANT
GOWN STRL REUS W/ TWL LRG LVL3 (GOWN DISPOSABLE) ×2 IMPLANT
GOWN STRL REUS W/TWL LRG LVL3 (GOWN DISPOSABLE) ×4
LABEL CATARACT MEDS ST (LABEL) ×3 IMPLANT
LENS IOL TECNIS ITEC 23.0 (Intraocular Lens) ×2 IMPLANT
NDL HPO THNWL 1X22GA REG BVL (NEEDLE) ×1 IMPLANT
NEEDLE SAFETY 22GX1 (NEEDLE) ×2
PACK CATARACT (MISCELLANEOUS) ×3 IMPLANT
PACK CATARACT BRASINGTON LX (MISCELLANEOUS) ×3 IMPLANT
PACK EYE AFTER SURG (MISCELLANEOUS) ×3 IMPLANT
SOL BSS BAG (MISCELLANEOUS) ×3
SOLUTION BSS BAG (MISCELLANEOUS) ×1 IMPLANT
SYR 5ML LL (SYRINGE) ×3 IMPLANT
WATER STERILE IRR 250ML POUR (IV SOLUTION) ×3 IMPLANT
WIPE NON LINTING 3.25X3.25 (MISCELLANEOUS) ×3 IMPLANT

## 2019-03-13 NOTE — Anesthesia Postprocedure Evaluation (Signed)
Anesthesia Post Note  Patient: Whitney Cline  Procedure(s) Performed: CATARACT EXTRACTION EXTRACAPSULAR WITH INTRAOCULAR LENS PLACEMENT (IOC) RIGHT, DIABETIC (Right Eye)  Patient location during evaluation: Short Stay Anesthesia Type: MAC Level of consciousness: awake and alert Pain management: pain level controlled Vital Signs Assessment: post-procedure vital signs reviewed and stable Respiratory status: spontaneous breathing, nonlabored ventilation, respiratory function stable and patient connected to nasal cannula oxygen Cardiovascular status: stable and blood pressure returned to baseline Postop Assessment: no apparent nausea or vomiting Anesthetic complications: no     Last Vitals:  Vitals:   03/13/19 0733 03/13/19 0942  BP: (!) 142/55 130/66  Pulse: 74 63  Resp: 14 16  Temp: (!) 36.3 C 36.6 C  SpO2:  99%    Last Pain:  Vitals:   03/13/19 0733  TempSrc: Temporal  PainSc: 5                  Brantley Fling

## 2019-03-13 NOTE — Anesthesia Preprocedure Evaluation (Signed)
Anesthesia Evaluation  Patient identified by MRN, date of birth, ID band Patient awake    Reviewed: Allergy & Precautions, H&P , NPO status , Patient's Chart, lab work & pertinent test results, reviewed documented beta blocker date and time   Airway Mallampati: II  TM Distance: >3 FB Neck ROM: full    Dental no notable dental hx. (+) Teeth Intact   Pulmonary neg pulmonary ROS, shortness of breath, with exertion and at rest, asthma , sleep apnea , pneumonia, resolved, COPD, Patient abstained from smoking., former smoker,    Pulmonary exam normal breath sounds clear to auscultation       Cardiovascular Exercise Tolerance: Poor hypertension, On Medications + angina + CAD and +CHF  negative cardio ROS   Rhythm:regular Rate:Normal     Neuro/Psych PSYCHIATRIC DISORDERS Depression negative neurological ROS  negative psych ROS   GI/Hepatic negative GI ROS, Neg liver ROS, (+) Hepatitis -  Endo/Other  negative endocrine ROSdiabetes  Renal/GU Renal disease     Musculoskeletal   Abdominal   Peds  Hematology negative hematology ROS (+) Blood dyscrasia, anemia ,   Anesthesia Other Findings   Reproductive/Obstetrics negative OB ROS                             Anesthesia Physical Anesthesia Plan  ASA: IV  Anesthesia Plan: MAC   Post-op Pain Management:    Induction:   PONV Risk Score and Plan:   Airway Management Planned:   Additional Equipment:   Intra-op Plan:   Post-operative Plan:   Informed Consent: I have reviewed the patients History and Physical, chart, labs and discussed the procedure including the risks, benefits and alternatives for the proposed anesthesia with the patient or authorized representative who has indicated his/her understanding and acceptance.       Plan Discussed with: CRNA  Anesthesia Plan Comments:         Anesthesia Quick Evaluation

## 2019-03-13 NOTE — Anesthesia Post-op Follow-up Note (Signed)
Anesthesia QCDR form completed.        

## 2019-03-13 NOTE — Transfer of Care (Signed)
Immediate Anesthesia Transfer of Care Note  Patient: Whitney Cline  Procedure(s) Performed: CATARACT EXTRACTION EXTRACAPSULAR WITH INTRAOCULAR LENS PLACEMENT (IOC) RIGHT, DIABETIC (Right Eye)  Patient Location: Short Stay  Anesthesia Type:MAC  Level of Consciousness: awake, alert  and oriented  Airway & Oxygen Therapy: Patient Spontanous Breathing and Patient connected to nasal cannula oxygen  Post-op Assessment: Report given to RN and Post -op Vital signs reviewed and stable  Post vital signs: Reviewed  Last Vitals:  Vitals Value Taken Time  BP 130/66 03/13/19 0942  Temp 36.6 C 03/13/19 0942  Pulse 63 03/13/19 0942  Resp 16 03/13/19 0942  SpO2 99 % 03/13/19 0942    Last Pain:  Vitals:   03/13/19 0733  TempSrc: Temporal  PainSc: 5          Complications: No apparent anesthesia complications

## 2019-03-13 NOTE — Op Note (Signed)
OPERATIVE NOTE  Whitney Cline [id number] 03/13/2019   PREOPERATIVE DIAGNOSIS:  Nuclear Sclerotic Cataract Right Eye H25.11   POSTOPERATIVE DIAGNOSIS: Nuclear Sclerotic Cataract Right Eye H25.11          PROCEDURE:  Phacoemusification with posterior chamber intraocular lens placement of the right eye   LENS:   Implant Name Type Inv. Item Serial No. Manufacturer Lot No. LRB No. Used Action  LENS IOL DIOP 23.0 - [id number] Intraocular Lens LENS IOL DIOP 23.0 [id number] AMO  Right 1 Implanted       ULTRASOUND TIME: 21 %  of 1 minutes 0 seconds, CDE 9.7  SURGEON:  Wyonia Hough, MD   ANESTHESIA:  Topical with tetracaine drops and 2% Xylocaine jelly, augmented with 1% preservative-free intracameral lidocaine.    COMPLICATIONS:  None.   DESCRIPTION OF PROCEDURE:  The patient was identified in the holding room and transported to the operating room and placed in the supine position under the operating microscope. Theright eye was identified as the operative eye and it was prepped and draped in the usual sterile ophthalmic fashion.   A 1 millimeter clear-corneal paracentesis was made at the 12:00 position.  0.5 ml of preservative-free 1% lidocaine was injected into the anterior chamber. The anterior chamber was filled with Viscoat viscoelastic.  A 2.4 millimeter keratome was used to make a near-clear corneal incision at the 9:00 position. A curvilinear capsulorrhexis was made with a cystotome and capsulorrhexis forceps.  Balanced salt solution was used to hydrodissect and hydrodelineate the nucleus.   Phacoemulsification was then used in stop and chop fashion to remove the lens nucleus and epinucleus.  The remaining cortex was then removed using the irrigation and aspiration handpiece. Provisc was then placed into the capsular bag to distend it for lens placement.  A lens was then injected into the capsular bag.  The remaining viscoelastic was aspirated.  Wounds were hydrated  with balanced salt solution.  The anterior chamber was inflated to a physiologic pressure with balanced salt solution. Vigamox 0.2 ml of a 1mg  per ml solution was injected into the anterior chamber for a dose of 0.2 mg of intracameral antibiotic at the completion of the case. Miostat was placed into the anterior chamber to constrict the pupil.  No wound leaks were noted.  Topical Vigamox drops and Maxitrol ointment were applied to the eye.  The patient was taken to the recovery room in stable condition without complications of anesthesia or surgery.  [name] 03/13/2019, 9:39 AM

## 2019-03-13 NOTE — Discharge Instructions (Signed)
Eye Surgery Discharge Instructions    Expect mild scratchy sensation or mild soreness. DO NOT RUB YOUR EYE!  The day of surgery:  Minimal physical activity, but bed rest is not required  No reading, computer work, or close hand work  No bending, lifting, or straining.  May watch TV  For 24 hours:  No driving, legal decisions, or alcoholic beverages  Safety precautions  Eat anything you prefer: It is better to start with liquids, then soup then solid foods.  _____ Eye patch should be worn until postoperative exam tomorrow.  ____ Solar shield eyeglasses should be worn for comfort in the sunlight/patch while sleeping  Resume all regular medications including aspirin or Coumadin if these were discontinued prior to surgery. You may shower, bathe, shave, or wash your hair. Tylenol may be taken for mild discomfort.  Call your doctor if you experience significant pain, nausea, or vomiting, fever > 101 or other signs of infection. [phone] or [phone] Specific instructions:  Follow-up Information    Leandrew Koyanagi, MD Follow up on 03/13/2019.   Specialty: Ophthalmology Why: @ 1:10 pm for post op visit. Contact information: [address]   [place] [address] [phone]

## 2019-03-13 NOTE — H&P (Signed)

## 2019-03-14 ENCOUNTER — Ambulatory Visit: Admit: 2019-03-14 | Discharge: 2019-03-14 | Payer: MEDICARE

## 2019-03-14 DIAGNOSIS — R188 Other ascites: Secondary | ICD-10-CM

## 2019-03-14 DIAGNOSIS — K7460 Unspecified cirrhosis of liver: Principal | ICD-10-CM

## 2019-03-15 ENCOUNTER — Other Ambulatory Visit: Payer: Self-pay

## 2019-03-15 ENCOUNTER — Ambulatory Visit: Payer: Medicare HMO | Attending: Anesthesiology | Admitting: Anesthesiology

## 2019-03-21 ENCOUNTER — Ambulatory Visit: Admit: 2019-03-21 | Discharge: 2019-03-22 | Payer: MEDICARE

## 2019-03-21 DIAGNOSIS — K7460 Unspecified cirrhosis of liver: Principal | ICD-10-CM

## 2019-03-21 DIAGNOSIS — R188 Other ascites: Secondary | ICD-10-CM

## 2019-03-28 ENCOUNTER — Ambulatory Visit: Admit: 2019-03-28 | Discharge: 2019-03-29 | Payer: MEDICARE

## 2019-03-28 DIAGNOSIS — K7460 Unspecified cirrhosis of liver: Principal | ICD-10-CM

## 2019-03-28 DIAGNOSIS — L089 Local infection of the skin and subcutaneous tissue, unspecified: Secondary | ICD-10-CM

## 2019-03-28 DIAGNOSIS — R188 Other ascites: Secondary | ICD-10-CM

## 2019-03-28 MED ORDER — CEPHALEXIN 500 MG CAPSULE
ORAL_CAPSULE | Freq: Two times a day (BID) | ORAL | 0 refills | 10 days | Status: CP
Start: 2019-03-28 — End: 2019-04-07

## 2019-04-03 ENCOUNTER — Encounter: Payer: Self-pay | Admitting: *Deleted

## 2019-04-04 ENCOUNTER — Ambulatory Visit: Admit: 2019-04-04 | Discharge: 2019-04-05 | Payer: MEDICARE

## 2019-04-04 DIAGNOSIS — R188 Other ascites: Secondary | ICD-10-CM

## 2019-04-04 DIAGNOSIS — K7460 Unspecified cirrhosis of liver: Secondary | ICD-10-CM

## 2019-04-06 DIAGNOSIS — K7460 Unspecified cirrhosis of liver: Secondary | ICD-10-CM

## 2019-04-06 DIAGNOSIS — R21 Rash and other nonspecific skin eruption: Secondary | ICD-10-CM

## 2019-04-06 MED ORDER — HYDROCORTISONE 2.5 % TOPICAL CREAM
Freq: Two times a day (BID) | TOPICAL | 4 refills | 0 days
Start: 2019-04-06 — End: 2019-05-06

## 2019-04-06 MED ORDER — HYDROXYZINE HCL 25 MG TABLET
ORAL_TABLET | Freq: Three times a day (TID) | ORAL | 4 refills | 30.00000 days | Status: CP | PRN
Start: 2019-04-06 — End: ?

## 2019-04-10 ENCOUNTER — Telehealth: Payer: Self-pay | Admitting: *Deleted

## 2019-04-10 NOTE — Telephone Encounter (Signed)
Spoke with patient about her medications.  Patient has been in and out of hospital and rehab.  Will put her on the schedule for 04/12/19 for medication management with DR Andree Elk.

## 2019-04-11 ENCOUNTER — Ambulatory Visit: Admit: 2019-04-11 | Discharge: 2019-04-12 | Payer: MEDICARE

## 2019-04-11 DIAGNOSIS — R197 Diarrhea, unspecified: Secondary | ICD-10-CM

## 2019-04-11 DIAGNOSIS — R188 Other ascites: Secondary | ICD-10-CM

## 2019-04-11 DIAGNOSIS — K7460 Unspecified cirrhosis of liver: Secondary | ICD-10-CM

## 2019-04-11 DIAGNOSIS — Z66 Do not resuscitate: Secondary | ICD-10-CM

## 2019-04-11 DIAGNOSIS — Z79899 Other long term (current) drug therapy: Secondary | ICD-10-CM

## 2019-04-12 ENCOUNTER — Encounter: Payer: Self-pay | Admitting: Anesthesiology

## 2019-04-12 ENCOUNTER — Ambulatory Visit: Payer: Medicare HMO | Attending: Anesthesiology | Admitting: Anesthesiology

## 2019-04-12 ENCOUNTER — Other Ambulatory Visit: Payer: Self-pay

## 2019-04-12 DIAGNOSIS — F1190 Opioid use, unspecified, uncomplicated: Secondary | ICD-10-CM | POA: Diagnosis not present

## 2019-04-12 DIAGNOSIS — M545 Low back pain, unspecified: Secondary | ICD-10-CM

## 2019-04-12 DIAGNOSIS — M5136 Other intervertebral disc degeneration, lumbar region: Secondary | ICD-10-CM | POA: Diagnosis not present

## 2019-04-12 DIAGNOSIS — G894 Chronic pain syndrome: Secondary | ICD-10-CM | POA: Diagnosis not present

## 2019-04-12 DIAGNOSIS — M5386 Other specified dorsopathies, lumbar region: Secondary | ICD-10-CM

## 2019-04-12 DIAGNOSIS — M51369 Other intervertebral disc degeneration, lumbar region without mention of lumbar back pain or lower extremity pain: Secondary | ICD-10-CM

## 2019-04-12 DIAGNOSIS — M542 Cervicalgia: Secondary | ICD-10-CM

## 2019-04-12 DIAGNOSIS — M48062 Spinal stenosis, lumbar region with neurogenic claudication: Secondary | ICD-10-CM

## 2019-04-12 DIAGNOSIS — M4802 Spinal stenosis, cervical region: Secondary | ICD-10-CM

## 2019-04-12 MED ORDER — OXYCODONE HCL 5 MG PO TABS: 5.0000 mg | ORAL_TABLET | Freq: Three times a day (TID) | ORAL | 0 refills | Status: AC

## 2019-04-12 MED ORDER — OXYCODONE HCL 5 MG PO CAPS: 5.0000 mg | ORAL_CAPSULE | Freq: Four times a day (QID) | ORAL | 0 refills | Status: AC | PRN

## 2019-04-13 ENCOUNTER — Other Ambulatory Visit
Admission: RE | Admit: 2019-04-13 | Discharge: 2019-04-13 | Disposition: A | Discharge status: Home / Self Care | Payer: Medicare HMO | Source: Ambulatory Visit | Attending: Ophthalmology | Admitting: Ophthalmology

## 2019-04-13 ENCOUNTER — Other Ambulatory Visit: Payer: Self-pay

## 2019-04-13 DIAGNOSIS — Z01812 Encounter for preprocedural laboratory examination: Secondary | ICD-10-CM | POA: Diagnosis present

## 2019-04-13 DIAGNOSIS — Z20828 Contact with and (suspected) exposure to other viral communicable diseases: Secondary | ICD-10-CM | POA: Diagnosis not present

## 2019-04-14 LAB — SARS CORONAVIRUS 2 (TAT 6-24 HRS): SARS Coronavirus 2: NEGATIVE

## 2019-04-17 ENCOUNTER — Other Ambulatory Visit: Payer: Self-pay

## 2019-04-17 ENCOUNTER — Ambulatory Visit
Admission: RE | Admit: 2019-04-17 | Discharge: 2019-04-17 | Disposition: A | Discharge status: Home / Self Care | Payer: Medicare HMO | Attending: Ophthalmology | Admitting: Ophthalmology

## 2019-04-17 ENCOUNTER — Ambulatory Visit: Payer: Medicare HMO | Admitting: Certified Registered Nurse Anesthetist

## 2019-04-17 ENCOUNTER — Encounter
Admission: RE | Disposition: A | Discharge status: Home / Self Care | Payer: Self-pay | Source: Home / Self Care | Attending: Ophthalmology

## 2019-04-17 ENCOUNTER — Encounter: Payer: Self-pay | Admitting: *Deleted

## 2019-04-17 DIAGNOSIS — D649 Anemia, unspecified: Secondary | ICD-10-CM | POA: Insufficient documentation

## 2019-04-17 DIAGNOSIS — Z6841 Body Mass Index (BMI) 40.0 and over, adult: Secondary | ICD-10-CM | POA: Insufficient documentation

## 2019-04-17 DIAGNOSIS — F329 Major depressive disorder, single episode, unspecified: Secondary | ICD-10-CM | POA: Diagnosis not present

## 2019-04-17 DIAGNOSIS — N189 Chronic kidney disease, unspecified: Secondary | ICD-10-CM | POA: Diagnosis not present

## 2019-04-17 DIAGNOSIS — E1122 Type 2 diabetes mellitus with diabetic chronic kidney disease: Secondary | ICD-10-CM | POA: Insufficient documentation

## 2019-04-17 DIAGNOSIS — J449 Chronic obstructive pulmonary disease, unspecified: Secondary | ICD-10-CM | POA: Diagnosis not present

## 2019-04-17 DIAGNOSIS — Z79899 Other long term (current) drug therapy: Secondary | ICD-10-CM | POA: Insufficient documentation

## 2019-04-17 DIAGNOSIS — I509 Heart failure, unspecified: Secondary | ICD-10-CM | POA: Diagnosis not present

## 2019-04-17 DIAGNOSIS — I2510 Atherosclerotic heart disease of native coronary artery without angina pectoris: Secondary | ICD-10-CM | POA: Diagnosis not present

## 2019-04-17 DIAGNOSIS — I130 Hypertensive heart and chronic kidney disease with heart failure and stage 1 through stage 4 chronic kidney disease, or unspecified chronic kidney disease: Secondary | ICD-10-CM | POA: Insufficient documentation

## 2019-04-17 DIAGNOSIS — E1136 Type 2 diabetes mellitus with diabetic cataract: Secondary | ICD-10-CM | POA: Diagnosis not present

## 2019-04-17 DIAGNOSIS — H2512 Age-related nuclear cataract, left eye: Secondary | ICD-10-CM | POA: Insufficient documentation

## 2019-04-17 DIAGNOSIS — Z888 Allergy status to other drugs, medicaments and biological substances status: Secondary | ICD-10-CM | POA: Insufficient documentation

## 2019-04-17 DIAGNOSIS — Z7951 Long term (current) use of inhaled steroids: Secondary | ICD-10-CM | POA: Diagnosis not present

## 2019-04-17 DIAGNOSIS — Z87891 Personal history of nicotine dependence: Secondary | ICD-10-CM | POA: Diagnosis not present

## 2019-04-17 DIAGNOSIS — Z9981 Dependence on supplemental oxygen: Secondary | ICD-10-CM | POA: Diagnosis not present

## 2019-04-17 DIAGNOSIS — M1990 Unspecified osteoarthritis, unspecified site: Secondary | ICD-10-CM | POA: Diagnosis not present

## 2019-04-17 DIAGNOSIS — E669 Obesity, unspecified: Secondary | ICD-10-CM | POA: Diagnosis not present

## 2019-04-17 DIAGNOSIS — G4730 Sleep apnea, unspecified: Secondary | ICD-10-CM | POA: Diagnosis not present

## 2019-04-17 HISTORY — DX: Dependence on supplemental oxygen: Z99.81

## 2019-04-17 HISTORY — DX: Other intervertebral disc degeneration, lumbar region: M51.36

## 2019-04-17 HISTORY — PX: CATARACT EXTRACTION W/PHACO: SHX586

## 2019-04-17 HISTORY — DX: Bronchitis, not specified as acute or chronic: J40

## 2019-04-17 HISTORY — DX: Other intervertebral disc degeneration, lumbar region without mention of lumbar back pain or lower extremity pain: M51.369

## 2019-04-17 HISTORY — DX: Edema, unspecified: R60.9

## 2019-04-17 HISTORY — DX: Cardiac arrhythmia, unspecified: I49.9

## 2019-04-17 HISTORY — DX: Other allergic rhinitis: J30.89

## 2019-04-17 LAB — GLUCOSE, CAPILLARY: Glucose-Capillary: 166 mg/dL — ABNORMAL HIGH (ref 70–99)

## 2019-04-17 SURGERY — PHACOEMULSIFICATION, CATARACT, WITH IOL INSERTION
Anesthesia: Monitor Anesthesia Care | Site: Eye | Laterality: Left

## 2019-04-17 MED ORDER — POVIDONE-IODINE 5 % OP SOLN
OPHTHALMIC | Status: DC | PRN
  Administered 2019-04-17: 1 via OPHTHALMIC

## 2019-04-17 MED ORDER — FENTANYL CITRATE (PF) 100 MCG/2ML IJ SOLN
INTRAMUSCULAR | Status: AC
  Filled 2019-04-17: qty 2

## 2019-04-17 MED ORDER — POVIDONE-IODINE 5 % OP SOLN
OPHTHALMIC | Status: AC
  Filled 2019-04-17: qty 30

## 2019-04-17 MED ORDER — NA HYALUR & NA CHOND-NA HYALUR 0.55-0.5 ML IO KIT
PACK | INTRAOCULAR | Status: AC
  Filled 2019-04-17: qty 1.05

## 2019-04-17 MED ORDER — EPINEPHRINE PF 1 MG/ML IJ SOLN
INTRAOCULAR | Status: DC | PRN
  Administered 2019-04-17: 08:00:00 1 mL via OPHTHALMIC

## 2019-04-17 MED ORDER — FENTANYL CITRATE (PF) 100 MCG/2ML IJ SOLN
INTRAMUSCULAR | Status: DC | PRN
  Administered 2019-04-17 (×2): 25 ug via INTRAVENOUS

## 2019-04-17 MED ORDER — NA CHONDROIT SULF-NA HYALURON 40-30 MG/ML IO SOLN
INTRAOCULAR | Status: AC
  Filled 2019-04-17: qty 0.5

## 2019-04-17 MED ORDER — TETRACAINE HCL 0.5 % OP SOLN
1.0000 [drp] | OPHTHALMIC | Status: DC | PRN
  Administered 2019-04-17 (×2): 1 [drp] via OPHTHALMIC

## 2019-04-17 MED ORDER — NA HYALUR & NA CHOND-NA HYALUR 0.4-0.35 ML IO KIT
PACK | INTRAOCULAR | Status: DC | PRN
  Administered 2019-04-17: 1 mL via INTRAOCULAR

## 2019-04-17 MED ORDER — ARMC OPHTHALMIC DILATING DROPS
1.0000 "application " | OPHTHALMIC | Status: AC
  Administered 2019-04-17 (×3): 1 via OPHTHALMIC

## 2019-04-17 MED ORDER — MOXIFLOXACIN HCL 0.5 % OP SOLN: 1.0000 [drp] | OPHTHALMIC | Status: DC | PRN

## 2019-04-17 MED ORDER — NEOMYCIN-POLYMYXIN-DEXAMETH 3.5-10000-0.1 OP OINT
TOPICAL_OINTMENT | OPHTHALMIC | Status: AC
  Filled 2019-04-17: qty 3.5

## 2019-04-17 MED ORDER — TETRACAINE HCL 0.5 % OP SOLN
OPHTHALMIC | Status: AC
  Administered 2019-04-17: 08:00:00 1 [drp] via OPHTHALMIC
  Filled 2019-04-17: qty 4

## 2019-04-17 MED ORDER — LIDOCAINE HCL (PF) 4 % IJ SOLN
INTRAOCULAR | Status: DC | PRN
  Administered 2019-04-17: 2.25 mL via OPHTHALMIC

## 2019-04-17 MED ORDER — EPINEPHRINE PF 1 MG/ML IJ SOLN
INTRAMUSCULAR | Status: AC
  Filled 2019-04-17: qty 1

## 2019-04-17 MED ORDER — LIDOCAINE HCL (PF) 4 % IJ SOLN
INTRAMUSCULAR | Status: AC
  Filled 2019-04-17: qty 5

## 2019-04-17 MED ORDER — MOXIFLOXACIN HCL 0.5 % OP SOLN
1.0000 [drp] | OPHTHALMIC | Status: AC
  Administered 2019-04-17 (×3): 1 [drp] via OPHTHALMIC

## 2019-04-17 MED ORDER — ARMC OPHTHALMIC DILATING DROPS
OPHTHALMIC | Status: AC
  Administered 2019-04-17: 1 via OPHTHALMIC
  Filled 2019-04-17: qty 0.5

## 2019-04-17 MED ORDER — CARBACHOL 0.01 % IO SOLN
INTRAOCULAR | Status: DC | PRN
  Administered 2019-04-17: .5 mL via INTRAOCULAR

## 2019-04-17 MED ORDER — SODIUM CHLORIDE 0.9 % IV SOLN
INTRAVENOUS | Status: DC
  Administered 2019-04-17: 08:00:00 via INTRAVENOUS

## 2019-04-17 MED ORDER — MOXIFLOXACIN HCL 0.5 % OP SOLN
OPHTHALMIC | Status: AC
  Administered 2019-04-17: 07:00:00 1 [drp] via OPHTHALMIC
  Filled 2019-04-17: qty 6

## 2019-04-17 MED ORDER — NEOMYCIN-POLYMYXIN-DEXAMETH 0.1 % OP OINT
TOPICAL_OINTMENT | OPHTHALMIC | Status: DC | PRN
  Administered 2019-04-17: 1 via OPHTHALMIC

## 2019-04-17 SURGICAL SUPPLY — 18 items
GLOVE BIO SURGEON STRL SZ8 (GLOVE) ×3 IMPLANT
GLOVE BIOGEL M 6.5 STRL (GLOVE) ×3 IMPLANT
GLOVE SURG LX 7.5 STRW (GLOVE) ×2
GLOVE SURG LX STRL 7.5 STRW (GLOVE) ×1 IMPLANT
GOWN STRL REUS W/ TWL LRG LVL3 (GOWN DISPOSABLE) ×2 IMPLANT
GOWN STRL REUS W/TWL LRG LVL3 (GOWN DISPOSABLE) ×4
LABEL CATARACT MEDS ST (LABEL) ×3 IMPLANT
LENS IOL TECNIS ITEC 23.5 (Intraocular Lens) ×2 IMPLANT
NDL HPO THNWL 1X22GA REG BVL (NEEDLE) ×1 IMPLANT
NEEDLE SAFETY 22GX1 (NEEDLE) ×2
PACK CATARACT (MISCELLANEOUS) ×3 IMPLANT
PACK CATARACT BRASINGTON LX (MISCELLANEOUS) ×3 IMPLANT
PACK EYE AFTER SURG (MISCELLANEOUS) ×3 IMPLANT
SOL BSS BAG (MISCELLANEOUS) ×3
SOLUTION BSS BAG (MISCELLANEOUS) ×1 IMPLANT
SYR 5ML LL (SYRINGE) ×3 IMPLANT
WATER STERILE IRR 250ML POUR (IV SOLUTION) ×3 IMPLANT
WIPE NON LINTING 3.25X3.25 (MISCELLANEOUS) ×3 IMPLANT

## 2019-04-17 NOTE — Op Note (Signed)
OPERATIVE NOTE  Whitney Cline [id number] 04/17/2019   PREOPERATIVE DIAGNOSIS:  Nuclear sclerotic cataract left eye. H25.12   POSTOPERATIVE DIAGNOSIS:    Nuclear sclerotic cataract left eye.     PROCEDURE:  Phacoemusification with posterior chamber intraocular lens placement of the left eye  Procedure(s) with comments: CATARACT EXTRACTION PHACO AND INTRAOCULAR LENS PLACEMENT (IOC) LEFT DIABETIC (Left) - [place] 00:59 AP% 13.2 CDE 8.09 fluid pack lot # [id number] LENS:   Implant Name Type Inv. Item Serial No. Manufacturer Lot No. LRB No. Used Action  LENS IOL DIOP 23.5 - [id number] Intraocular Lens LENS IOL DIOP 23.5 [id number] AMO  Left 1 Implanted         SURGEON:  Wyonia Hough, MD   ANESTHESIA:  Topical with tetracaine drops and 2% Xylocaine jelly, augmented with 1% preservative-free intracameral lidocaine.    COMPLICATIONS:  None.   DESCRIPTION OF PROCEDURE:  The patient was identified in the holding room and transported to the operating room and placed in the supine position under the operating microscope.  The left eye was identified as the operative eye and it was prepped and draped in the usual sterile ophthalmic fashion.   A 1 millimeter clear-corneal paracentesis was made at the 1:30 position. 0.5 ml of preservative-free 1% lidocaine was injected into the anterior chamber.  The anterior chamber was filled with Viscoat viscoelastic.  A 2.4 millimeter keratome was used to make a near-clear corneal incision at the 10:30 position.  .  A curvilinear capsulorrhexis was made with a cystotome and capsulorrhexis forceps.  Balanced salt solution was used to hydrodissect and hydrodelineate the nucleus.   Phacoemulsification was then used in stop and chop fashion to remove the lens nucleus and epinucleus.  The remaining cortex was then removed using the irrigation and aspiration handpiece. Provisc was then placed into the capsular bag to distend it for lens placement.  A lens  was then injected into the capsular bag.  The remaining viscoelastic was aspirated.   Wounds were hydrated with balanced salt solution.  The anterior chamber was inflated to a physiologic pressure with balanced salt solution. Vigamox 0.2 ml of a 1mg  per ml solution was injected into the anterior chamber for a dose of 0.2 mg of intracameral antibiotic at the completion of the case.  Miostat was placed into the anterior chamber to constrict the pupil.  No wound leaks were noted.  Topical Vigamox drops and Maxitrol ointment were applied to the eye.  The patient was taken to the recovery room in stable condition without complications of anesthesia or surgery  Whitney Cline 04/17/2019, 8:41 AM

## 2019-04-17 NOTE — Anesthesia Preprocedure Evaluation (Addendum)
Anesthesia Evaluation  Patient identified by MRN, date of birth, ID band Patient awake    Reviewed: Allergy & Precautions, NPO status , Patient's Chart, lab work & pertinent test results  History of Anesthesia Complications Negative for: history of anesthetic complications  Airway Mallampati: III  TM Distance: >3 FB Neck ROM: Full    Dental  (+) Poor Dentition   Pulmonary asthma , sleep apnea , COPD,  COPD inhaler and oxygen dependent, former smoker,    breath sounds clear to auscultation- rhonchi (-) wheezing      Cardiovascular hypertension, + CAD and +CHF  (-) Past MI, (-) Cardiac Stents and (-) CABG  Rhythm:Regular Rate:Normal - Systolic murmurs and - Diastolic murmurs    Neuro/Psych neg Seizures PSYCHIATRIC DISORDERS Depression    GI/Hepatic negative GI ROS, (+) Cirrhosis       , Hepatitis -, C  Endo/Other  diabetes  Renal/GU Renal InsufficiencyRenal disease     Musculoskeletal  (+) Arthritis ,   Abdominal (+) + obese,   Peds  Hematology  (+) anemia ,   Anesthesia Other Findings Past Medical History: No date: Anemia No date: Anginal pain (HCC) No date: Arthritis No date: Asthma No date: Bronchitis No date: Cancer ([place])     Comment:  liver ca- had ablation cancer free now No date: CHF (congestive heart failure) (HCC) No date: Chronic kidney disease No date: COPD (chronic obstructive pulmonary disease) (HCC) No date: Coronary artery disease No date: DDD (degenerative disc disease), lumbar No date: Depression No date: Diabetes mellitus without complication (HCC) No date: Dyspnea No date: Dysrhythmia No date: Edema No date: Encounter for blood transfusion No date: Environmental and seasonal allergies No date: Hepatitis C No date: Hypertension No date: Kidney infection     Comment:  8/19 No date: Obesity No date: Oxygen deficiency No date: Oxygen dependent No date: Pneumonia No date: Shingles     Comment:  2017 No date: Sleep apnea   Reproductive/Obstetrics                             Anesthesia Physical Anesthesia Plan  ASA: IV  Anesthesia Plan: MAC   Post-op Pain Management:    Induction: Intravenous  PONV Risk Score and Plan: 2 and Treatment may vary due to age or medical condition  Airway Management Planned: Natural Airway  Additional Equipment:   Intra-op Plan:   Post-operative Plan:   Informed Consent: I have reviewed the patients History and Physical, chart, labs and discussed the procedure including the risks, benefits and alternatives for the proposed anesthesia with the patient or authorized representative who has indicated his/her understanding and acceptance.       Plan Discussed with: CRNA and Anesthesiologist  Anesthesia Plan Comments:         Anesthesia Quick Evaluation

## 2019-04-17 NOTE — H&P (Signed)

## 2019-04-17 NOTE — Transfer of Care (Signed)
Immediate Anesthesia Transfer of Care Note  Patient: Whitney Cline  Procedure(s) Performed: CATARACT EXTRACTION PHACO AND INTRAOCULAR LENS PLACEMENT (IOC) LEFT DIABETIC (Left Eye)  Patient Location: PACU  Anesthesia Type:MAC  Level of Consciousness: awake, alert  and oriented  Airway & Oxygen Therapy: Patient Spontanous Breathing and Patient connected to nasal cannula oxygen  Post-op Assessment: Report given to RN and Post -op Vital signs reviewed and stable  Post vital signs: Reviewed and stable  Last Vitals:  Vitals Value Taken Time  BP 147/59 04/17/19 0838  Temp 36.4 C 04/17/19 0838  Pulse 78 04/17/19 0838  Resp 16 04/17/19 0838  SpO2 100 % 04/17/19 0838    Last Pain:  Vitals:   04/17/19 0838  TempSrc: Temporal  PainSc: 0-No pain         Complications: No apparent anesthesia complications

## 2019-04-17 NOTE — Discharge Instructions (Addendum)
Eye Surgery Discharge Instructions  Expect mild scratchy sensation or mild soreness. DO NOT RUB YOUR EYE!  The day of surgery:  Minimal physical activity, but bed rest is not required  No reading, computer work, or close hand work  No bending, lifting, or straining.  May watch TV  For 24 hours:  No driving, legal decisions, or alcoholic beverages  Safety precautions  Eat anything you prefer: It is better to start with liquids, then soup then solid foods.  Solar shield eyeglasses should be worn for comfort in the sunlight/patch while sleeping  Resume all regular medications including aspirin or Coumadin if these were discontinued prior to surgery. You may shower, bathe, shave, or wash your hair. Tylenol may be taken for mild discomfort. Follow eye drop instruction sheet as reviewed.  Call your doctor if you experience significant pain, nausea, or vomiting, fever > 101 or other signs of infection. [phone] or [phone] Specific instructions:  Follow-up Information    Leandrew Koyanagi, MD Follow up.   Specialty: Ophthalmology Why: 04-17-19 @ 2:15 pm Contact information: [address]   [place] [address] [phone]

## 2019-04-17 NOTE — Anesthesia Post-op Follow-up Note (Signed)
Anesthesia QCDR form completed.        

## 2019-04-17 NOTE — Anesthesia Procedure Notes (Signed)
Procedure Name: MAC Date/Time: 04/17/2019 8:09 AM Performed by: Johnna Acosta, CRNA Pre-anesthesia Checklist: Patient identified, Emergency Drugs available, Suction available, Patient being monitored and Timeout performed Patient Re-evaluated:Patient Re-evaluated prior to induction Oxygen Delivery Method: Nasal cannula Preoxygenation: Pre-oxygenation with 100% oxygen

## 2019-04-18 DIAGNOSIS — I130 Hypertensive heart and chronic kidney disease with heart failure and stage 1 through stage 4 chronic kidney disease, or unspecified chronic kidney disease: Secondary | ICD-10-CM

## 2019-04-18 DIAGNOSIS — K766 Portal hypertension: Secondary | ICD-10-CM

## 2019-04-18 DIAGNOSIS — E875 Hyperkalemia: Secondary | ICD-10-CM

## 2019-04-18 DIAGNOSIS — D6959 Other secondary thrombocytopenia: Secondary | ICD-10-CM

## 2019-04-18 DIAGNOSIS — N390 Urinary tract infection, site not specified: Secondary | ICD-10-CM

## 2019-04-18 DIAGNOSIS — N184 Chronic kidney disease, stage 4 (severe): Secondary | ICD-10-CM

## 2019-04-18 DIAGNOSIS — E1122 Type 2 diabetes mellitus with diabetic chronic kidney disease: Secondary | ICD-10-CM

## 2019-04-18 DIAGNOSIS — D61818 Other pancytopenia: Secondary | ICD-10-CM

## 2019-04-18 DIAGNOSIS — D509 Iron deficiency anemia, unspecified: Secondary | ICD-10-CM

## 2019-04-18 DIAGNOSIS — J449 Chronic obstructive pulmonary disease, unspecified: Secondary | ICD-10-CM

## 2019-04-18 DIAGNOSIS — K7460 Unspecified cirrhosis of liver: Principal | ICD-10-CM

## 2019-04-18 DIAGNOSIS — Z20828 Contact with and (suspected) exposure to other viral communicable diseases: Secondary | ICD-10-CM

## 2019-04-18 DIAGNOSIS — R188 Other ascites: Secondary | ICD-10-CM

## 2019-04-18 DIAGNOSIS — Z6841 Body Mass Index (BMI) 40.0 and over, adult: Secondary | ICD-10-CM

## 2019-04-18 DIAGNOSIS — M5136 Other intervertebral disc degeneration, lumbar region: Secondary | ICD-10-CM

## 2019-04-18 DIAGNOSIS — K922 Gastrointestinal hemorrhage, unspecified: Secondary | ICD-10-CM

## 2019-04-18 DIAGNOSIS — Z87891 Personal history of nicotine dependence: Secondary | ICD-10-CM

## 2019-04-18 DIAGNOSIS — N179 Acute kidney failure, unspecified: Secondary | ICD-10-CM

## 2019-04-18 DIAGNOSIS — G8929 Other chronic pain: Secondary | ICD-10-CM

## 2019-04-18 DIAGNOSIS — K767 Hepatorenal syndrome: Secondary | ICD-10-CM

## 2019-04-18 DIAGNOSIS — K7469 Other cirrhosis of liver: Secondary | ICD-10-CM

## 2019-04-18 DIAGNOSIS — C229 Malignant neoplasm of liver, not specified as primary or secondary: Secondary | ICD-10-CM

## 2019-04-18 DIAGNOSIS — B182 Chronic viral hepatitis C: Secondary | ICD-10-CM

## 2019-04-18 DIAGNOSIS — E1140 Type 2 diabetes mellitus with diabetic neuropathy, unspecified: Secondary | ICD-10-CM

## 2019-04-18 DIAGNOSIS — F329 Major depressive disorder, single episode, unspecified: Secondary | ICD-10-CM

## 2019-04-18 DIAGNOSIS — Z66 Do not resuscitate: Secondary | ICD-10-CM

## 2019-04-18 DIAGNOSIS — G4733 Obstructive sleep apnea (adult) (pediatric): Secondary | ICD-10-CM

## 2019-04-18 DIAGNOSIS — I8500 Esophageal varices without bleeding: Secondary | ICD-10-CM

## 2019-04-18 DIAGNOSIS — I8510 Secondary esophageal varices without bleeding: Secondary | ICD-10-CM

## 2019-04-18 DIAGNOSIS — M4800 Spinal stenosis, site unspecified: Secondary | ICD-10-CM

## 2019-04-18 DIAGNOSIS — I272 Pulmonary hypertension, unspecified: Secondary | ICD-10-CM

## 2019-04-18 DIAGNOSIS — K921 Melena: Secondary | ICD-10-CM

## 2019-04-18 DIAGNOSIS — Z9981 Dependence on supplemental oxygen: Secondary | ICD-10-CM

## 2019-04-18 DIAGNOSIS — Z515 Encounter for palliative care: Secondary | ICD-10-CM

## 2019-04-18 DIAGNOSIS — G253 Myoclonus: Secondary | ICD-10-CM

## 2019-04-18 DIAGNOSIS — K210 Gastro-esophageal reflux disease with esophagitis: Secondary | ICD-10-CM

## 2019-04-18 DIAGNOSIS — D631 Anemia in chronic kidney disease: Secondary | ICD-10-CM

## 2019-04-18 NOTE — Anesthesia Postprocedure Evaluation (Signed)
Anesthesia Post Note  Patient: Whitney Cline  Procedure(s) Performed: CATARACT EXTRACTION PHACO AND INTRAOCULAR LENS PLACEMENT (IOC) LEFT DIABETIC (Left Eye)  Patient location during evaluation: PACU Anesthesia Type: MAC Level of consciousness: awake and alert and oriented Pain management: pain level controlled Vital Signs Assessment: post-procedure vital signs reviewed and stable Respiratory status: spontaneous breathing, nonlabored ventilation and respiratory function stable Cardiovascular status: blood pressure returned to baseline and stable Postop Assessment: no signs of nausea or vomiting Anesthetic complications: no     Last Vitals:  Vitals:   04/17/19 0838 04/17/19 0850  BP: (!) 147/59 (!) 129/53  Pulse: 78 73  Resp: 16 16  Temp: (!) 36.4 C   SpO2: 100% 100%    Last Pain:  Vitals:   04/17/19 0850  TempSrc:   PainSc: 0-No pain                 Mckenzy Salazar

## 2019-04-18 NOTE — Progress Notes (Signed)
Virtual Visit via Telephone Note  I connected with Whitney Cline on 04/18/19 at  3:15 PM EDT by telephone and verified that I am speaking with the correct person using two identifiers.  Location: Patient: Home Provider: Pain control center   I discussed the limitations, risks, security and privacy concerns of performing an evaluation and management service by telephone and the availability of in person appointments. I also discussed with the patient that there may be a patient responsible charge related to this service. The patient expressed understanding and agreed to proceed.   History of Present Illness: I spoke with Whitney Cline via telephone conferencing for her appointment today.  She has been doing reasonably well with her low back pain but continues to have intermittent problems with pain with radiation into the lower legs.  Her symptom complex is reportedly stable with no new changes in lower extremity strength or function.  She is taking her medications as prescribed and these give her 2 to 4 hours worth of relief.  Nothing else much seems to help she reports.  Otherwise she is in her usual state of health without significant change.  Medications have been giving her better relief than more conservative therapy and she desires to continue with them.  No side effects are reported.    Observations/Objective:No current facility-administered medications for this visit.  No current outpatient medications on file.  Facility-Administered Medications Ordered in Other Visits:  .  0.9 %  sodium chloride infusion, , Intravenous, Continuous, Durenda Hurt, MD, Last Rate: 20 mL/hr at 04/17/19 0741 .  tetracaine (PONTOCAINE) 0.5 % ophthalmic solution 1 drop, 1 drop, Left Eye, PRN, Leandrew Koyanagi, MD, 1 drop at 04/17/19 0801   Assessment and Plan: 1. DDD (degenerative disc disease), lumbar   2. Low back derangement syndrome   3. Chronic pain syndrome   4. Chronic, continuous use of  opioids   5. Spinal stenosis in cervical region   6. Spinal stenosis of lumbar region with neurogenic claudication   7. Low back pain at multiple sites   8. Cervicalgia   Based on our discussion today I am going to refill her medications.  This will be for September 18 and October 18.  She is to continue follow-up with her primary care physicians for baseline medical care.  We will schedule her for return to clinic in 2 months.  I have reviewed the [facility] practitioner database information and it is appropriate.   Follow Up Instructions:    I discussed the assessment and treatment plan with the patient. The patient was provided an opportunity to ask questions and all were answered. The patient agreed with the plan and demonstrated an understanding of the instructions.   The patient was advised to call back or seek an in-person evaluation if the symptoms worsen or if the condition fails to improve as anticipated.  I provided 30 minutes of non-face-to-face time during this encounter.   Molli Barrows, MD

## 2019-04-20 ENCOUNTER — Ambulatory Visit
Admit: 2019-04-20 | Discharge: 2019-05-02 | Disposition: A | Discharge status: Skilled Nursing Facility | Payer: MEDICARE

## 2019-04-20 ENCOUNTER — Encounter
Admit: 2019-04-20 | Discharge: 2019-05-02 | Disposition: A | Discharge status: Skilled Nursing Facility | Payer: MEDICARE | Attending: Nurse Practitioner

## 2019-04-20 DIAGNOSIS — K922 Gastrointestinal hemorrhage, unspecified: Secondary | ICD-10-CM

## 2019-04-20 DIAGNOSIS — G253 Myoclonus: Secondary | ICD-10-CM

## 2019-04-20 DIAGNOSIS — F329 Major depressive disorder, single episode, unspecified: Secondary | ICD-10-CM

## 2019-04-20 DIAGNOSIS — D6959 Other secondary thrombocytopenia: Secondary | ICD-10-CM

## 2019-04-20 DIAGNOSIS — D509 Iron deficiency anemia, unspecified: Secondary | ICD-10-CM

## 2019-04-20 DIAGNOSIS — K766 Portal hypertension: Secondary | ICD-10-CM

## 2019-04-20 DIAGNOSIS — Z66 Do not resuscitate: Secondary | ICD-10-CM

## 2019-04-20 DIAGNOSIS — I272 Pulmonary hypertension, unspecified: Secondary | ICD-10-CM

## 2019-04-20 DIAGNOSIS — M5136 Other intervertebral disc degeneration, lumbar region: Secondary | ICD-10-CM

## 2019-04-20 DIAGNOSIS — J432 Centrilobular emphysema: Secondary | ICD-10-CM

## 2019-04-20 DIAGNOSIS — I8510 Secondary esophageal varices without bleeding: Secondary | ICD-10-CM

## 2019-04-20 DIAGNOSIS — C229 Malignant neoplasm of liver, not specified as primary or secondary: Secondary | ICD-10-CM

## 2019-04-20 DIAGNOSIS — K210 Gastro-esophageal reflux disease with esophagitis: Secondary | ICD-10-CM

## 2019-04-20 DIAGNOSIS — Z20828 Contact with and (suspected) exposure to other viral communicable diseases: Secondary | ICD-10-CM

## 2019-04-20 DIAGNOSIS — E1122 Type 2 diabetes mellitus with diabetic chronic kidney disease: Secondary | ICD-10-CM

## 2019-04-20 DIAGNOSIS — M4800 Spinal stenosis, site unspecified: Secondary | ICD-10-CM

## 2019-04-20 DIAGNOSIS — D61818 Other pancytopenia: Secondary | ICD-10-CM

## 2019-04-20 DIAGNOSIS — I130 Hypertensive heart and chronic kidney disease with heart failure and stage 1 through stage 4 chronic kidney disease, or unspecified chronic kidney disease: Secondary | ICD-10-CM

## 2019-04-20 DIAGNOSIS — E1140 Type 2 diabetes mellitus with diabetic neuropathy, unspecified: Secondary | ICD-10-CM

## 2019-04-20 DIAGNOSIS — Z515 Encounter for palliative care: Secondary | ICD-10-CM

## 2019-04-20 DIAGNOSIS — I8500 Esophageal varices without bleeding: Secondary | ICD-10-CM

## 2019-04-20 DIAGNOSIS — K921 Melena: Secondary | ICD-10-CM

## 2019-04-20 DIAGNOSIS — N184 Chronic kidney disease, stage 4 (severe): Secondary | ICD-10-CM

## 2019-04-20 DIAGNOSIS — K7469 Other cirrhosis of liver: Secondary | ICD-10-CM

## 2019-04-20 DIAGNOSIS — Z9981 Dependence on supplemental oxygen: Secondary | ICD-10-CM

## 2019-04-20 DIAGNOSIS — N390 Urinary tract infection, site not specified: Secondary | ICD-10-CM

## 2019-04-20 DIAGNOSIS — D631 Anemia in chronic kidney disease: Secondary | ICD-10-CM

## 2019-04-20 DIAGNOSIS — J449 Chronic obstructive pulmonary disease, unspecified: Secondary | ICD-10-CM

## 2019-04-20 DIAGNOSIS — N179 Acute kidney failure, unspecified: Secondary | ICD-10-CM

## 2019-04-20 DIAGNOSIS — R188 Other ascites: Secondary | ICD-10-CM

## 2019-04-20 DIAGNOSIS — K7460 Unspecified cirrhosis of liver: Secondary | ICD-10-CM

## 2019-04-20 DIAGNOSIS — Z87891 Personal history of nicotine dependence: Secondary | ICD-10-CM

## 2019-04-20 DIAGNOSIS — K767 Hepatorenal syndrome: Secondary | ICD-10-CM

## 2019-04-20 DIAGNOSIS — B182 Chronic viral hepatitis C: Secondary | ICD-10-CM

## 2019-04-20 DIAGNOSIS — E875 Hyperkalemia: Secondary | ICD-10-CM

## 2019-04-20 DIAGNOSIS — G8929 Other chronic pain: Secondary | ICD-10-CM

## 2019-04-20 DIAGNOSIS — G4733 Obstructive sleep apnea (adult) (pediatric): Secondary | ICD-10-CM

## 2019-04-20 DIAGNOSIS — N183 Chronic kidney disease, stage 3 (moderate): Secondary | ICD-10-CM

## 2019-04-20 DIAGNOSIS — Z6841 Body Mass Index (BMI) 40.0 and over, adult: Secondary | ICD-10-CM

## 2019-04-21 DIAGNOSIS — E875 Hyperkalemia: Secondary | ICD-10-CM

## 2019-04-21 DIAGNOSIS — D631 Anemia in chronic kidney disease: Secondary | ICD-10-CM

## 2019-04-21 DIAGNOSIS — R188 Other ascites: Secondary | ICD-10-CM

## 2019-04-21 DIAGNOSIS — K766 Portal hypertension: Secondary | ICD-10-CM

## 2019-04-21 DIAGNOSIS — G253 Myoclonus: Secondary | ICD-10-CM

## 2019-04-21 DIAGNOSIS — F329 Major depressive disorder, single episode, unspecified: Secondary | ICD-10-CM

## 2019-04-21 DIAGNOSIS — G4733 Obstructive sleep apnea (adult) (pediatric): Secondary | ICD-10-CM

## 2019-04-21 DIAGNOSIS — E1140 Type 2 diabetes mellitus with diabetic neuropathy, unspecified: Secondary | ICD-10-CM

## 2019-04-21 DIAGNOSIS — Z515 Encounter for palliative care: Secondary | ICD-10-CM

## 2019-04-21 DIAGNOSIS — Z9981 Dependence on supplemental oxygen: Secondary | ICD-10-CM

## 2019-04-21 DIAGNOSIS — Z6841 Body Mass Index (BMI) 40.0 and over, adult: Secondary | ICD-10-CM

## 2019-04-21 DIAGNOSIS — K7469 Other cirrhosis of liver: Secondary | ICD-10-CM

## 2019-04-21 DIAGNOSIS — M4800 Spinal stenosis, site unspecified: Secondary | ICD-10-CM

## 2019-04-21 DIAGNOSIS — I8510 Secondary esophageal varices without bleeding: Secondary | ICD-10-CM

## 2019-04-21 DIAGNOSIS — E1122 Type 2 diabetes mellitus with diabetic chronic kidney disease: Secondary | ICD-10-CM

## 2019-04-21 DIAGNOSIS — I272 Pulmonary hypertension, unspecified: Secondary | ICD-10-CM

## 2019-04-21 DIAGNOSIS — G8929 Other chronic pain: Secondary | ICD-10-CM

## 2019-04-21 DIAGNOSIS — I130 Hypertensive heart and chronic kidney disease with heart failure and stage 1 through stage 4 chronic kidney disease, or unspecified chronic kidney disease: Secondary | ICD-10-CM

## 2019-04-21 DIAGNOSIS — Z20828 Contact with and (suspected) exposure to other viral communicable diseases: Secondary | ICD-10-CM

## 2019-04-21 DIAGNOSIS — K922 Gastrointestinal hemorrhage, unspecified: Secondary | ICD-10-CM

## 2019-04-21 DIAGNOSIS — I8500 Esophageal varices without bleeding: Secondary | ICD-10-CM

## 2019-04-21 DIAGNOSIS — K210 Gastro-esophageal reflux disease with esophagitis: Secondary | ICD-10-CM

## 2019-04-21 DIAGNOSIS — Z66 Do not resuscitate: Secondary | ICD-10-CM

## 2019-04-21 DIAGNOSIS — K921 Melena: Secondary | ICD-10-CM

## 2019-04-21 DIAGNOSIS — Z87891 Personal history of nicotine dependence: Secondary | ICD-10-CM

## 2019-04-21 DIAGNOSIS — B182 Chronic viral hepatitis C: Secondary | ICD-10-CM

## 2019-04-21 DIAGNOSIS — N184 Chronic kidney disease, stage 4 (severe): Secondary | ICD-10-CM

## 2019-04-21 DIAGNOSIS — N179 Acute kidney failure, unspecified: Secondary | ICD-10-CM

## 2019-04-21 DIAGNOSIS — N390 Urinary tract infection, site not specified: Secondary | ICD-10-CM

## 2019-04-21 DIAGNOSIS — D509 Iron deficiency anemia, unspecified: Secondary | ICD-10-CM

## 2019-04-21 DIAGNOSIS — D6959 Other secondary thrombocytopenia: Secondary | ICD-10-CM

## 2019-04-21 DIAGNOSIS — C229 Malignant neoplasm of liver, not specified as primary or secondary: Secondary | ICD-10-CM

## 2019-04-21 DIAGNOSIS — D61818 Other pancytopenia: Secondary | ICD-10-CM

## 2019-04-21 DIAGNOSIS — J449 Chronic obstructive pulmonary disease, unspecified: Secondary | ICD-10-CM

## 2019-04-21 DIAGNOSIS — M5136 Other intervertebral disc degeneration, lumbar region: Secondary | ICD-10-CM

## 2019-04-21 DIAGNOSIS — K767 Hepatorenal syndrome: Secondary | ICD-10-CM

## 2019-04-23 DIAGNOSIS — Z515 Encounter for palliative care: Secondary | ICD-10-CM

## 2019-04-23 DIAGNOSIS — E1140 Type 2 diabetes mellitus with diabetic neuropathy, unspecified: Secondary | ICD-10-CM

## 2019-04-23 DIAGNOSIS — I8500 Esophageal varices without bleeding: Secondary | ICD-10-CM

## 2019-04-23 DIAGNOSIS — Z87891 Personal history of nicotine dependence: Secondary | ICD-10-CM

## 2019-04-23 DIAGNOSIS — D6959 Other secondary thrombocytopenia: Secondary | ICD-10-CM

## 2019-04-23 DIAGNOSIS — I8510 Secondary esophageal varices without bleeding: Secondary | ICD-10-CM

## 2019-04-23 DIAGNOSIS — R188 Other ascites: Secondary | ICD-10-CM

## 2019-04-23 DIAGNOSIS — N184 Chronic kidney disease, stage 4 (severe): Secondary | ICD-10-CM

## 2019-04-23 DIAGNOSIS — I130 Hypertensive heart and chronic kidney disease with heart failure and stage 1 through stage 4 chronic kidney disease, or unspecified chronic kidney disease: Secondary | ICD-10-CM

## 2019-04-23 DIAGNOSIS — E1122 Type 2 diabetes mellitus with diabetic chronic kidney disease: Secondary | ICD-10-CM

## 2019-04-23 DIAGNOSIS — Z9981 Dependence on supplemental oxygen: Secondary | ICD-10-CM

## 2019-04-23 DIAGNOSIS — D631 Anemia in chronic kidney disease: Secondary | ICD-10-CM

## 2019-04-23 DIAGNOSIS — G8929 Other chronic pain: Secondary | ICD-10-CM

## 2019-04-23 DIAGNOSIS — N390 Urinary tract infection, site not specified: Secondary | ICD-10-CM

## 2019-04-23 DIAGNOSIS — M5136 Other intervertebral disc degeneration, lumbar region: Secondary | ICD-10-CM

## 2019-04-23 DIAGNOSIS — K921 Melena: Secondary | ICD-10-CM

## 2019-04-23 DIAGNOSIS — E875 Hyperkalemia: Secondary | ICD-10-CM

## 2019-04-23 DIAGNOSIS — F329 Major depressive disorder, single episode, unspecified: Secondary | ICD-10-CM

## 2019-04-23 DIAGNOSIS — K7469 Other cirrhosis of liver: Secondary | ICD-10-CM

## 2019-04-23 DIAGNOSIS — K922 Gastrointestinal hemorrhage, unspecified: Secondary | ICD-10-CM

## 2019-04-23 DIAGNOSIS — K767 Hepatorenal syndrome: Secondary | ICD-10-CM

## 2019-04-23 DIAGNOSIS — M4800 Spinal stenosis, site unspecified: Secondary | ICD-10-CM

## 2019-04-23 DIAGNOSIS — N179 Acute kidney failure, unspecified: Secondary | ICD-10-CM

## 2019-04-23 DIAGNOSIS — G4733 Obstructive sleep apnea (adult) (pediatric): Secondary | ICD-10-CM

## 2019-04-23 DIAGNOSIS — I272 Pulmonary hypertension, unspecified: Secondary | ICD-10-CM

## 2019-04-23 DIAGNOSIS — D509 Iron deficiency anemia, unspecified: Secondary | ICD-10-CM

## 2019-04-23 DIAGNOSIS — C229 Malignant neoplasm of liver, not specified as primary or secondary: Secondary | ICD-10-CM

## 2019-04-23 DIAGNOSIS — J449 Chronic obstructive pulmonary disease, unspecified: Secondary | ICD-10-CM

## 2019-04-23 DIAGNOSIS — K210 Gastro-esophageal reflux disease with esophagitis: Secondary | ICD-10-CM

## 2019-04-23 DIAGNOSIS — G253 Myoclonus: Secondary | ICD-10-CM

## 2019-04-23 DIAGNOSIS — Z66 Do not resuscitate: Secondary | ICD-10-CM

## 2019-04-23 DIAGNOSIS — Z20828 Contact with and (suspected) exposure to other viral communicable diseases: Secondary | ICD-10-CM

## 2019-04-23 DIAGNOSIS — K766 Portal hypertension: Secondary | ICD-10-CM

## 2019-04-23 DIAGNOSIS — D61818 Other pancytopenia: Secondary | ICD-10-CM

## 2019-04-23 DIAGNOSIS — Z6841 Body Mass Index (BMI) 40.0 and over, adult: Secondary | ICD-10-CM

## 2019-04-23 DIAGNOSIS — B182 Chronic viral hepatitis C: Secondary | ICD-10-CM

## 2019-04-25 DIAGNOSIS — K7469 Other cirrhosis of liver: Secondary | ICD-10-CM

## 2019-04-25 DIAGNOSIS — Z9981 Dependence on supplemental oxygen: Secondary | ICD-10-CM

## 2019-04-25 DIAGNOSIS — K766 Portal hypertension: Secondary | ICD-10-CM

## 2019-04-25 DIAGNOSIS — I8510 Secondary esophageal varices without bleeding: Secondary | ICD-10-CM

## 2019-04-25 DIAGNOSIS — D631 Anemia in chronic kidney disease: Secondary | ICD-10-CM

## 2019-04-25 DIAGNOSIS — K922 Gastrointestinal hemorrhage, unspecified: Secondary | ICD-10-CM

## 2019-04-25 DIAGNOSIS — G4733 Obstructive sleep apnea (adult) (pediatric): Secondary | ICD-10-CM

## 2019-04-25 DIAGNOSIS — G253 Myoclonus: Secondary | ICD-10-CM

## 2019-04-25 DIAGNOSIS — B182 Chronic viral hepatitis C: Secondary | ICD-10-CM

## 2019-04-25 DIAGNOSIS — N390 Urinary tract infection, site not specified: Secondary | ICD-10-CM

## 2019-04-25 DIAGNOSIS — Z87891 Personal history of nicotine dependence: Secondary | ICD-10-CM

## 2019-04-25 DIAGNOSIS — Z515 Encounter for palliative care: Secondary | ICD-10-CM

## 2019-04-25 DIAGNOSIS — I130 Hypertensive heart and chronic kidney disease with heart failure and stage 1 through stage 4 chronic kidney disease, or unspecified chronic kidney disease: Secondary | ICD-10-CM

## 2019-04-25 DIAGNOSIS — Z6841 Body Mass Index (BMI) 40.0 and over, adult: Secondary | ICD-10-CM

## 2019-04-25 DIAGNOSIS — K921 Melena: Secondary | ICD-10-CM

## 2019-04-25 DIAGNOSIS — M5136 Other intervertebral disc degeneration, lumbar region: Secondary | ICD-10-CM

## 2019-04-25 DIAGNOSIS — M4800 Spinal stenosis, site unspecified: Secondary | ICD-10-CM

## 2019-04-25 DIAGNOSIS — I8500 Esophageal varices without bleeding: Secondary | ICD-10-CM

## 2019-04-25 DIAGNOSIS — Z66 Do not resuscitate: Secondary | ICD-10-CM

## 2019-04-25 DIAGNOSIS — Z20828 Contact with and (suspected) exposure to other viral communicable diseases: Secondary | ICD-10-CM

## 2019-04-25 DIAGNOSIS — D6959 Other secondary thrombocytopenia: Secondary | ICD-10-CM

## 2019-04-25 DIAGNOSIS — D509 Iron deficiency anemia, unspecified: Secondary | ICD-10-CM

## 2019-04-25 DIAGNOSIS — J449 Chronic obstructive pulmonary disease, unspecified: Secondary | ICD-10-CM

## 2019-04-25 DIAGNOSIS — I272 Pulmonary hypertension, unspecified: Secondary | ICD-10-CM

## 2019-04-25 DIAGNOSIS — D61818 Other pancytopenia: Secondary | ICD-10-CM

## 2019-04-25 DIAGNOSIS — F329 Major depressive disorder, single episode, unspecified: Secondary | ICD-10-CM

## 2019-04-25 DIAGNOSIS — E1140 Type 2 diabetes mellitus with diabetic neuropathy, unspecified: Secondary | ICD-10-CM

## 2019-04-25 DIAGNOSIS — R188 Other ascites: Secondary | ICD-10-CM

## 2019-04-25 DIAGNOSIS — N184 Chronic kidney disease, stage 4 (severe): Secondary | ICD-10-CM

## 2019-04-25 DIAGNOSIS — G8929 Other chronic pain: Secondary | ICD-10-CM

## 2019-04-25 DIAGNOSIS — E875 Hyperkalemia: Secondary | ICD-10-CM

## 2019-04-25 DIAGNOSIS — K210 Gastro-esophageal reflux disease with esophagitis: Secondary | ICD-10-CM

## 2019-04-25 DIAGNOSIS — C229 Malignant neoplasm of liver, not specified as primary or secondary: Secondary | ICD-10-CM

## 2019-04-25 DIAGNOSIS — N179 Acute kidney failure, unspecified: Secondary | ICD-10-CM

## 2019-04-25 DIAGNOSIS — E1122 Type 2 diabetes mellitus with diabetic chronic kidney disease: Secondary | ICD-10-CM

## 2019-04-25 DIAGNOSIS — K767 Hepatorenal syndrome: Secondary | ICD-10-CM

## 2019-04-26 DIAGNOSIS — K210 Gastro-esophageal reflux disease with esophagitis: Secondary | ICD-10-CM

## 2019-04-26 DIAGNOSIS — D631 Anemia in chronic kidney disease: Secondary | ICD-10-CM

## 2019-04-26 DIAGNOSIS — D509 Iron deficiency anemia, unspecified: Secondary | ICD-10-CM

## 2019-04-26 DIAGNOSIS — B182 Chronic viral hepatitis C: Secondary | ICD-10-CM

## 2019-04-26 DIAGNOSIS — Z6841 Body Mass Index (BMI) 40.0 and over, adult: Secondary | ICD-10-CM

## 2019-04-26 DIAGNOSIS — Z20828 Contact with and (suspected) exposure to other viral communicable diseases: Secondary | ICD-10-CM

## 2019-04-26 DIAGNOSIS — E875 Hyperkalemia: Secondary | ICD-10-CM

## 2019-04-26 DIAGNOSIS — F329 Major depressive disorder, single episode, unspecified: Secondary | ICD-10-CM

## 2019-04-26 DIAGNOSIS — E1122 Type 2 diabetes mellitus with diabetic chronic kidney disease: Secondary | ICD-10-CM

## 2019-04-26 DIAGNOSIS — D6959 Other secondary thrombocytopenia: Secondary | ICD-10-CM

## 2019-04-26 DIAGNOSIS — I130 Hypertensive heart and chronic kidney disease with heart failure and stage 1 through stage 4 chronic kidney disease, or unspecified chronic kidney disease: Secondary | ICD-10-CM

## 2019-04-26 DIAGNOSIS — N390 Urinary tract infection, site not specified: Secondary | ICD-10-CM

## 2019-04-26 DIAGNOSIS — G4733 Obstructive sleep apnea (adult) (pediatric): Secondary | ICD-10-CM

## 2019-04-26 DIAGNOSIS — I8510 Secondary esophageal varices without bleeding: Secondary | ICD-10-CM

## 2019-04-26 DIAGNOSIS — Z87891 Personal history of nicotine dependence: Secondary | ICD-10-CM

## 2019-04-26 DIAGNOSIS — Z66 Do not resuscitate: Secondary | ICD-10-CM

## 2019-04-26 DIAGNOSIS — C229 Malignant neoplasm of liver, not specified as primary or secondary: Secondary | ICD-10-CM

## 2019-04-26 DIAGNOSIS — I272 Pulmonary hypertension, unspecified: Secondary | ICD-10-CM

## 2019-04-26 DIAGNOSIS — R188 Other ascites: Secondary | ICD-10-CM

## 2019-04-26 DIAGNOSIS — K766 Portal hypertension: Secondary | ICD-10-CM

## 2019-04-26 DIAGNOSIS — K922 Gastrointestinal hemorrhage, unspecified: Secondary | ICD-10-CM

## 2019-04-26 DIAGNOSIS — D61818 Other pancytopenia: Secondary | ICD-10-CM

## 2019-04-26 DIAGNOSIS — K767 Hepatorenal syndrome: Secondary | ICD-10-CM

## 2019-04-26 DIAGNOSIS — E1140 Type 2 diabetes mellitus with diabetic neuropathy, unspecified: Secondary | ICD-10-CM

## 2019-04-26 DIAGNOSIS — K921 Melena: Secondary | ICD-10-CM

## 2019-04-26 DIAGNOSIS — M4800 Spinal stenosis, site unspecified: Secondary | ICD-10-CM

## 2019-04-26 DIAGNOSIS — J449 Chronic obstructive pulmonary disease, unspecified: Secondary | ICD-10-CM

## 2019-04-26 DIAGNOSIS — G253 Myoclonus: Secondary | ICD-10-CM

## 2019-04-26 DIAGNOSIS — G8929 Other chronic pain: Secondary | ICD-10-CM

## 2019-04-26 DIAGNOSIS — Z515 Encounter for palliative care: Secondary | ICD-10-CM

## 2019-04-26 DIAGNOSIS — M5136 Other intervertebral disc degeneration, lumbar region: Secondary | ICD-10-CM

## 2019-04-26 DIAGNOSIS — I8500 Esophageal varices without bleeding: Secondary | ICD-10-CM

## 2019-04-26 DIAGNOSIS — Z9981 Dependence on supplemental oxygen: Secondary | ICD-10-CM

## 2019-04-26 DIAGNOSIS — K7469 Other cirrhosis of liver: Secondary | ICD-10-CM

## 2019-04-26 DIAGNOSIS — N179 Acute kidney failure, unspecified: Secondary | ICD-10-CM

## 2019-04-26 DIAGNOSIS — N184 Chronic kidney disease, stage 4 (severe): Secondary | ICD-10-CM

## 2019-04-30 DIAGNOSIS — K7469 Other cirrhosis of liver: Secondary | ICD-10-CM

## 2019-04-30 DIAGNOSIS — Z515 Encounter for palliative care: Secondary | ICD-10-CM

## 2019-04-30 DIAGNOSIS — K766 Portal hypertension: Secondary | ICD-10-CM

## 2019-04-30 DIAGNOSIS — C229 Malignant neoplasm of liver, not specified as primary or secondary: Secondary | ICD-10-CM

## 2019-04-30 DIAGNOSIS — N184 Chronic kidney disease, stage 4 (severe): Secondary | ICD-10-CM

## 2019-04-30 DIAGNOSIS — I8510 Secondary esophageal varices without bleeding: Secondary | ICD-10-CM

## 2019-04-30 DIAGNOSIS — M4800 Spinal stenosis, site unspecified: Secondary | ICD-10-CM

## 2019-04-30 DIAGNOSIS — K767 Hepatorenal syndrome: Secondary | ICD-10-CM

## 2019-04-30 DIAGNOSIS — B182 Chronic viral hepatitis C: Secondary | ICD-10-CM

## 2019-04-30 DIAGNOSIS — I272 Pulmonary hypertension, unspecified: Secondary | ICD-10-CM

## 2019-04-30 DIAGNOSIS — D6959 Other secondary thrombocytopenia: Secondary | ICD-10-CM

## 2019-04-30 DIAGNOSIS — E875 Hyperkalemia: Secondary | ICD-10-CM

## 2019-04-30 DIAGNOSIS — Z66 Do not resuscitate: Secondary | ICD-10-CM

## 2019-04-30 DIAGNOSIS — K922 Gastrointestinal hemorrhage, unspecified: Secondary | ICD-10-CM

## 2019-04-30 DIAGNOSIS — D631 Anemia in chronic kidney disease: Secondary | ICD-10-CM

## 2019-04-30 DIAGNOSIS — I8500 Esophageal varices without bleeding: Secondary | ICD-10-CM

## 2019-04-30 DIAGNOSIS — Z20828 Contact with and (suspected) exposure to other viral communicable diseases: Secondary | ICD-10-CM

## 2019-04-30 DIAGNOSIS — J449 Chronic obstructive pulmonary disease, unspecified: Secondary | ICD-10-CM

## 2019-04-30 DIAGNOSIS — N179 Acute kidney failure, unspecified: Secondary | ICD-10-CM

## 2019-04-30 DIAGNOSIS — K210 Gastro-esophageal reflux disease with esophagitis: Secondary | ICD-10-CM

## 2019-04-30 DIAGNOSIS — Z6841 Body Mass Index (BMI) 40.0 and over, adult: Secondary | ICD-10-CM

## 2019-04-30 DIAGNOSIS — E1140 Type 2 diabetes mellitus with diabetic neuropathy, unspecified: Secondary | ICD-10-CM

## 2019-04-30 DIAGNOSIS — M5136 Other intervertebral disc degeneration, lumbar region: Secondary | ICD-10-CM

## 2019-04-30 DIAGNOSIS — R188 Other ascites: Secondary | ICD-10-CM

## 2019-04-30 DIAGNOSIS — G4733 Obstructive sleep apnea (adult) (pediatric): Secondary | ICD-10-CM

## 2019-04-30 DIAGNOSIS — Z9981 Dependence on supplemental oxygen: Secondary | ICD-10-CM

## 2019-04-30 DIAGNOSIS — E1122 Type 2 diabetes mellitus with diabetic chronic kidney disease: Secondary | ICD-10-CM

## 2019-04-30 DIAGNOSIS — N390 Urinary tract infection, site not specified: Secondary | ICD-10-CM

## 2019-04-30 DIAGNOSIS — D509 Iron deficiency anemia, unspecified: Secondary | ICD-10-CM

## 2019-04-30 DIAGNOSIS — I130 Hypertensive heart and chronic kidney disease with heart failure and stage 1 through stage 4 chronic kidney disease, or unspecified chronic kidney disease: Secondary | ICD-10-CM

## 2019-04-30 DIAGNOSIS — D61818 Other pancytopenia: Secondary | ICD-10-CM

## 2019-04-30 DIAGNOSIS — F329 Major depressive disorder, single episode, unspecified: Secondary | ICD-10-CM

## 2019-04-30 DIAGNOSIS — K921 Melena: Secondary | ICD-10-CM

## 2019-04-30 DIAGNOSIS — Z87891 Personal history of nicotine dependence: Secondary | ICD-10-CM

## 2019-04-30 DIAGNOSIS — G8929 Other chronic pain: Secondary | ICD-10-CM

## 2019-04-30 DIAGNOSIS — G253 Myoclonus: Secondary | ICD-10-CM

## 2019-05-01 DIAGNOSIS — J449 Chronic obstructive pulmonary disease, unspecified: Secondary | ICD-10-CM

## 2019-05-01 DIAGNOSIS — K210 Gastro-esophageal reflux disease with esophagitis: Secondary | ICD-10-CM

## 2019-05-01 DIAGNOSIS — Z66 Do not resuscitate: Secondary | ICD-10-CM

## 2019-05-01 DIAGNOSIS — D61818 Other pancytopenia: Secondary | ICD-10-CM

## 2019-05-01 DIAGNOSIS — E1140 Type 2 diabetes mellitus with diabetic neuropathy, unspecified: Secondary | ICD-10-CM

## 2019-05-01 DIAGNOSIS — D509 Iron deficiency anemia, unspecified: Secondary | ICD-10-CM

## 2019-05-01 DIAGNOSIS — D6959 Other secondary thrombocytopenia: Secondary | ICD-10-CM

## 2019-05-01 DIAGNOSIS — G253 Myoclonus: Secondary | ICD-10-CM

## 2019-05-01 DIAGNOSIS — Z20828 Contact with and (suspected) exposure to other viral communicable diseases: Secondary | ICD-10-CM

## 2019-05-01 DIAGNOSIS — R188 Other ascites: Secondary | ICD-10-CM

## 2019-05-01 DIAGNOSIS — Z87891 Personal history of nicotine dependence: Secondary | ICD-10-CM

## 2019-05-01 DIAGNOSIS — I272 Pulmonary hypertension, unspecified: Secondary | ICD-10-CM

## 2019-05-01 DIAGNOSIS — Z9981 Dependence on supplemental oxygen: Secondary | ICD-10-CM

## 2019-05-01 DIAGNOSIS — B182 Chronic viral hepatitis C: Secondary | ICD-10-CM

## 2019-05-01 DIAGNOSIS — M4800 Spinal stenosis, site unspecified: Secondary | ICD-10-CM

## 2019-05-01 DIAGNOSIS — G4733 Obstructive sleep apnea (adult) (pediatric): Secondary | ICD-10-CM

## 2019-05-01 DIAGNOSIS — Z6841 Body Mass Index (BMI) 40.0 and over, adult: Secondary | ICD-10-CM

## 2019-05-01 DIAGNOSIS — K922 Gastrointestinal hemorrhage, unspecified: Secondary | ICD-10-CM

## 2019-05-01 DIAGNOSIS — I130 Hypertensive heart and chronic kidney disease with heart failure and stage 1 through stage 4 chronic kidney disease, or unspecified chronic kidney disease: Secondary | ICD-10-CM

## 2019-05-01 DIAGNOSIS — I8510 Secondary esophageal varices without bleeding: Secondary | ICD-10-CM

## 2019-05-01 DIAGNOSIS — D631 Anemia in chronic kidney disease: Secondary | ICD-10-CM

## 2019-05-01 DIAGNOSIS — N179 Acute kidney failure, unspecified: Secondary | ICD-10-CM

## 2019-05-01 DIAGNOSIS — N390 Urinary tract infection, site not specified: Secondary | ICD-10-CM

## 2019-05-01 DIAGNOSIS — G8929 Other chronic pain: Secondary | ICD-10-CM

## 2019-05-01 DIAGNOSIS — Z515 Encounter for palliative care: Secondary | ICD-10-CM

## 2019-05-01 DIAGNOSIS — M5136 Other intervertebral disc degeneration, lumbar region: Secondary | ICD-10-CM

## 2019-05-01 DIAGNOSIS — E875 Hyperkalemia: Secondary | ICD-10-CM

## 2019-05-01 DIAGNOSIS — K7469 Other cirrhosis of liver: Secondary | ICD-10-CM

## 2019-05-01 DIAGNOSIS — K921 Melena: Secondary | ICD-10-CM

## 2019-05-01 DIAGNOSIS — E1122 Type 2 diabetes mellitus with diabetic chronic kidney disease: Secondary | ICD-10-CM

## 2019-05-01 DIAGNOSIS — I8500 Esophageal varices without bleeding: Secondary | ICD-10-CM

## 2019-05-01 DIAGNOSIS — F329 Major depressive disorder, single episode, unspecified: Secondary | ICD-10-CM

## 2019-05-01 DIAGNOSIS — K767 Hepatorenal syndrome: Secondary | ICD-10-CM

## 2019-05-01 DIAGNOSIS — N184 Chronic kidney disease, stage 4 (severe): Secondary | ICD-10-CM

## 2019-05-01 DIAGNOSIS — K766 Portal hypertension: Secondary | ICD-10-CM

## 2019-05-01 DIAGNOSIS — C229 Malignant neoplasm of liver, not specified as primary or secondary: Secondary | ICD-10-CM

## 2019-05-02 MED ORDER — OXYCODONE 5 MG CAPSULE
ORAL_CAPSULE | Freq: Four times a day (QID) | ORAL | 0 refills | 4 days | Status: CP | PRN
Start: 2019-05-02 — End: 2019-05-07

## 2019-05-02 MED ORDER — DOXYCYCLINE HYCLATE 100 MG CAPSULE
ORAL_CAPSULE | Freq: Two times a day (BID) | ORAL | 0 refills | 3 days
Start: 2019-05-02 — End: 2019-05-05

## 2019-05-02 MED ORDER — GABAPENTIN 100 MG CAPSULE
ORAL_CAPSULE | Freq: Every evening | ORAL | 0 refills | 10.00000 days
Start: 2019-05-02 — End: 2019-06-01

## 2019-05-10 ENCOUNTER — Ambulatory Visit: Admit: 2019-05-10 | Discharge: 2019-05-10 | Disposition: A | Discharge status: Home / Self Care | Payer: MEDICARE

## 2019-05-10 ENCOUNTER — Emergency Department: Admit: 2019-05-10 | Discharge: 2019-05-10 | Disposition: A | Discharge status: Home / Self Care | Payer: MEDICARE

## 2019-05-10 DIAGNOSIS — Z043 Encounter for examination and observation following other accident: Secondary | ICD-10-CM

## 2019-05-10 DIAGNOSIS — Z87891 Personal history of nicotine dependence: Secondary | ICD-10-CM

## 2019-05-10 DIAGNOSIS — M546 Pain in thoracic spine: Secondary | ICD-10-CM

## 2019-05-10 DIAGNOSIS — K7290 Hepatic failure, unspecified without coma: Secondary | ICD-10-CM

## 2019-05-10 DIAGNOSIS — E119 Type 2 diabetes mellitus without complications: Secondary | ICD-10-CM

## 2019-05-10 DIAGNOSIS — I864 Gastric varices: Secondary | ICD-10-CM

## 2019-05-10 DIAGNOSIS — B1920 Unspecified viral hepatitis C without hepatic coma: Secondary | ICD-10-CM

## 2019-05-10 DIAGNOSIS — S5002XA Contusion of left elbow, initial encounter: Secondary | ICD-10-CM

## 2019-05-10 DIAGNOSIS — W19XXXA Unspecified fall, initial encounter: Secondary | ICD-10-CM

## 2019-05-10 DIAGNOSIS — S60512A Abrasion of left hand, initial encounter: Secondary | ICD-10-CM

## 2019-05-10 DIAGNOSIS — S0010XA Contusion of unspecified eyelid and periocular area, initial encounter: Secondary | ICD-10-CM

## 2019-05-10 DIAGNOSIS — S0093XA Contusion of unspecified part of head, initial encounter: Secondary | ICD-10-CM

## 2019-05-10 DIAGNOSIS — D509 Iron deficiency anemia, unspecified: Secondary | ICD-10-CM

## 2019-05-10 DIAGNOSIS — J449 Chronic obstructive pulmonary disease, unspecified: Secondary | ICD-10-CM

## 2019-05-10 DIAGNOSIS — I272 Pulmonary hypertension, unspecified: Secondary | ICD-10-CM

## 2019-05-10 DIAGNOSIS — C229 Malignant neoplasm of liver, not specified as primary or secondary: Secondary | ICD-10-CM

## 2019-05-10 DIAGNOSIS — S60222A Contusion of left hand, initial encounter: Secondary | ICD-10-CM

## 2019-05-10 DIAGNOSIS — S50312A Abrasion of left elbow, initial encounter: Secondary | ICD-10-CM

## 2019-05-10 DIAGNOSIS — M4854XA Collapsed vertebra, not elsewhere classified, thoracic region, initial encounter for fracture: Secondary | ICD-10-CM

## 2019-05-10 DIAGNOSIS — G4733 Obstructive sleep apnea (adult) (pediatric): Secondary | ICD-10-CM

## 2019-05-10 DIAGNOSIS — I10 Essential (primary) hypertension: Secondary | ICD-10-CM

## 2019-05-10 DIAGNOSIS — K7460 Unspecified cirrhosis of liver: Secondary | ICD-10-CM

## 2019-05-10 DIAGNOSIS — M545 Low back pain: Secondary | ICD-10-CM

## 2019-05-14 MED ORDER — DOXEPIN 10 MG CAPSULE: capsule | 11 refills | 0 days | Status: AC

## 2019-06-03 DEATH — deceased

## 2020-02-07 IMAGING — DX RIGHT WRIST - 2 VIEW
2 series · 2 of 2 positions shown · non-contrast
Comparison: Forearm radiographs earlier the same date.

CLINICAL DATA: Post reduction of distal radial fracture.

EXAM:
RIGHT WRIST - 2 VIEW

[forearm ap]
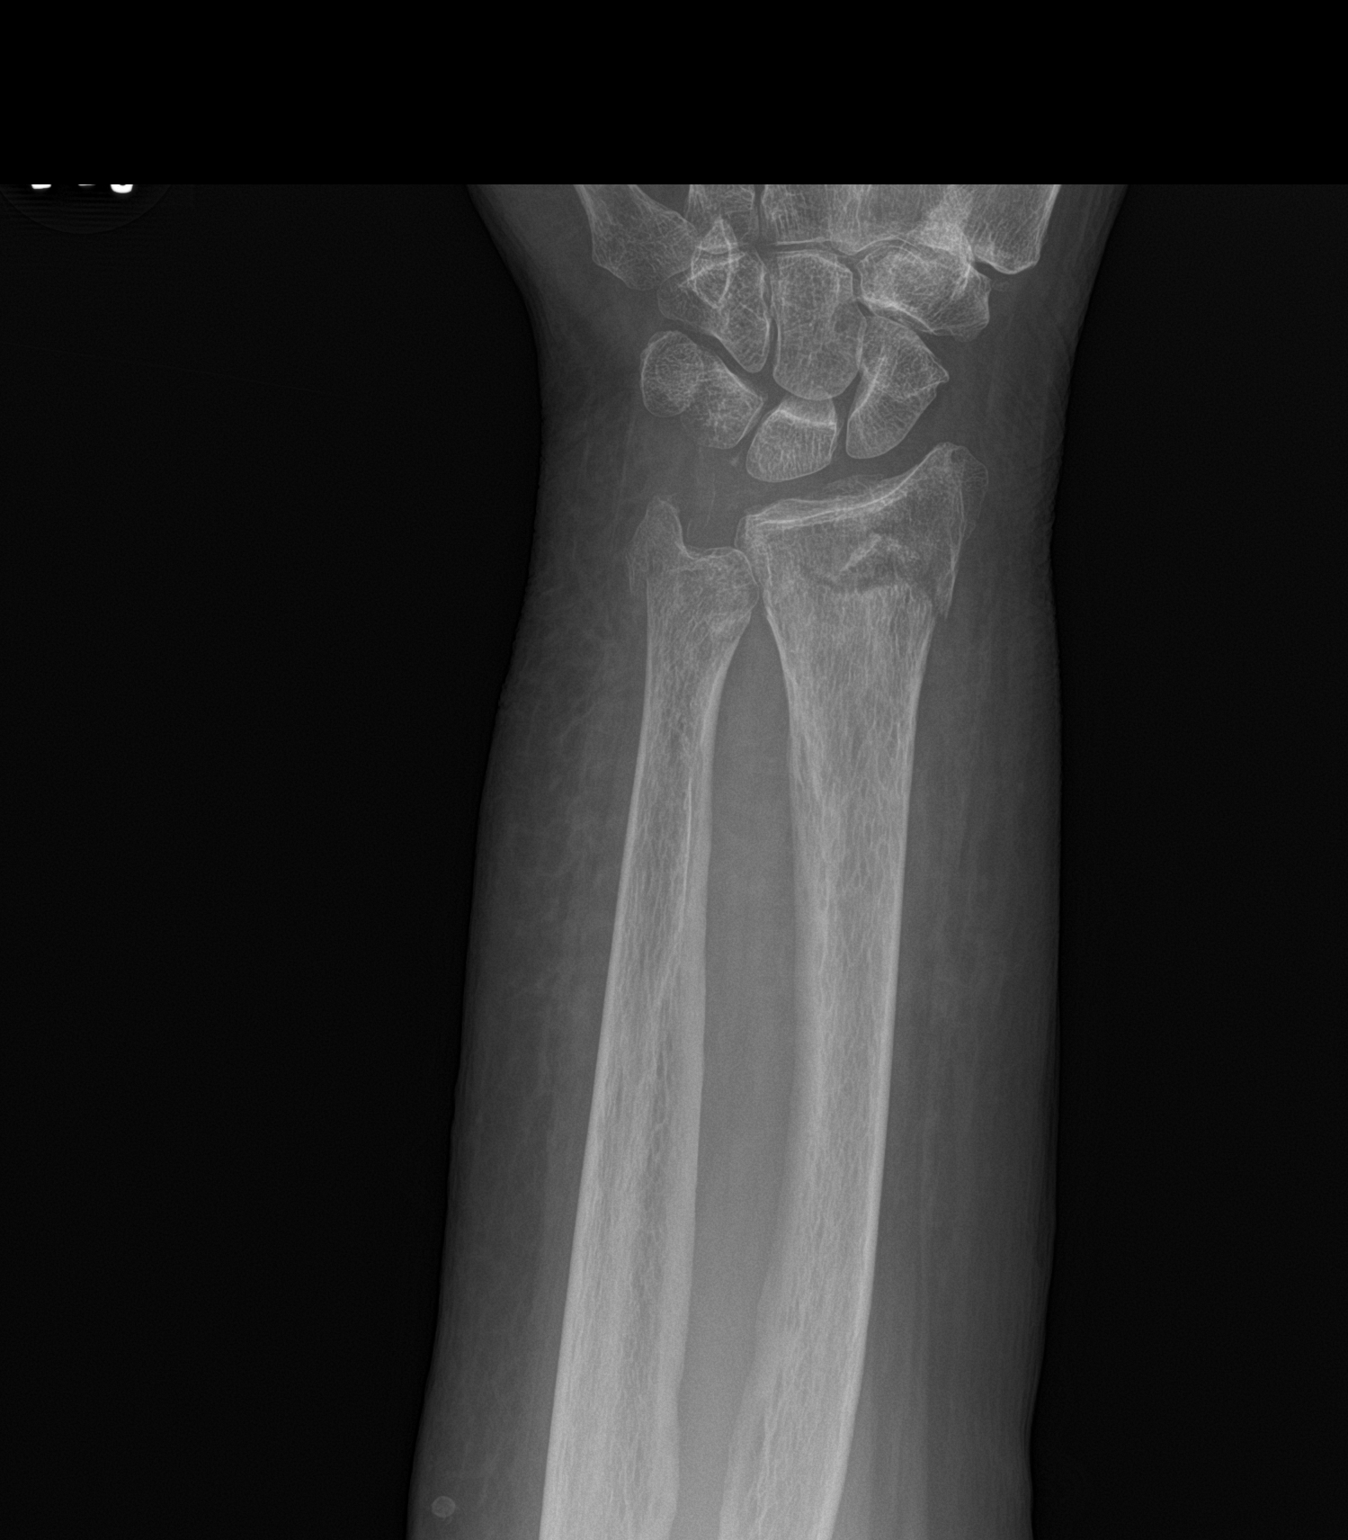

[forearm lat]
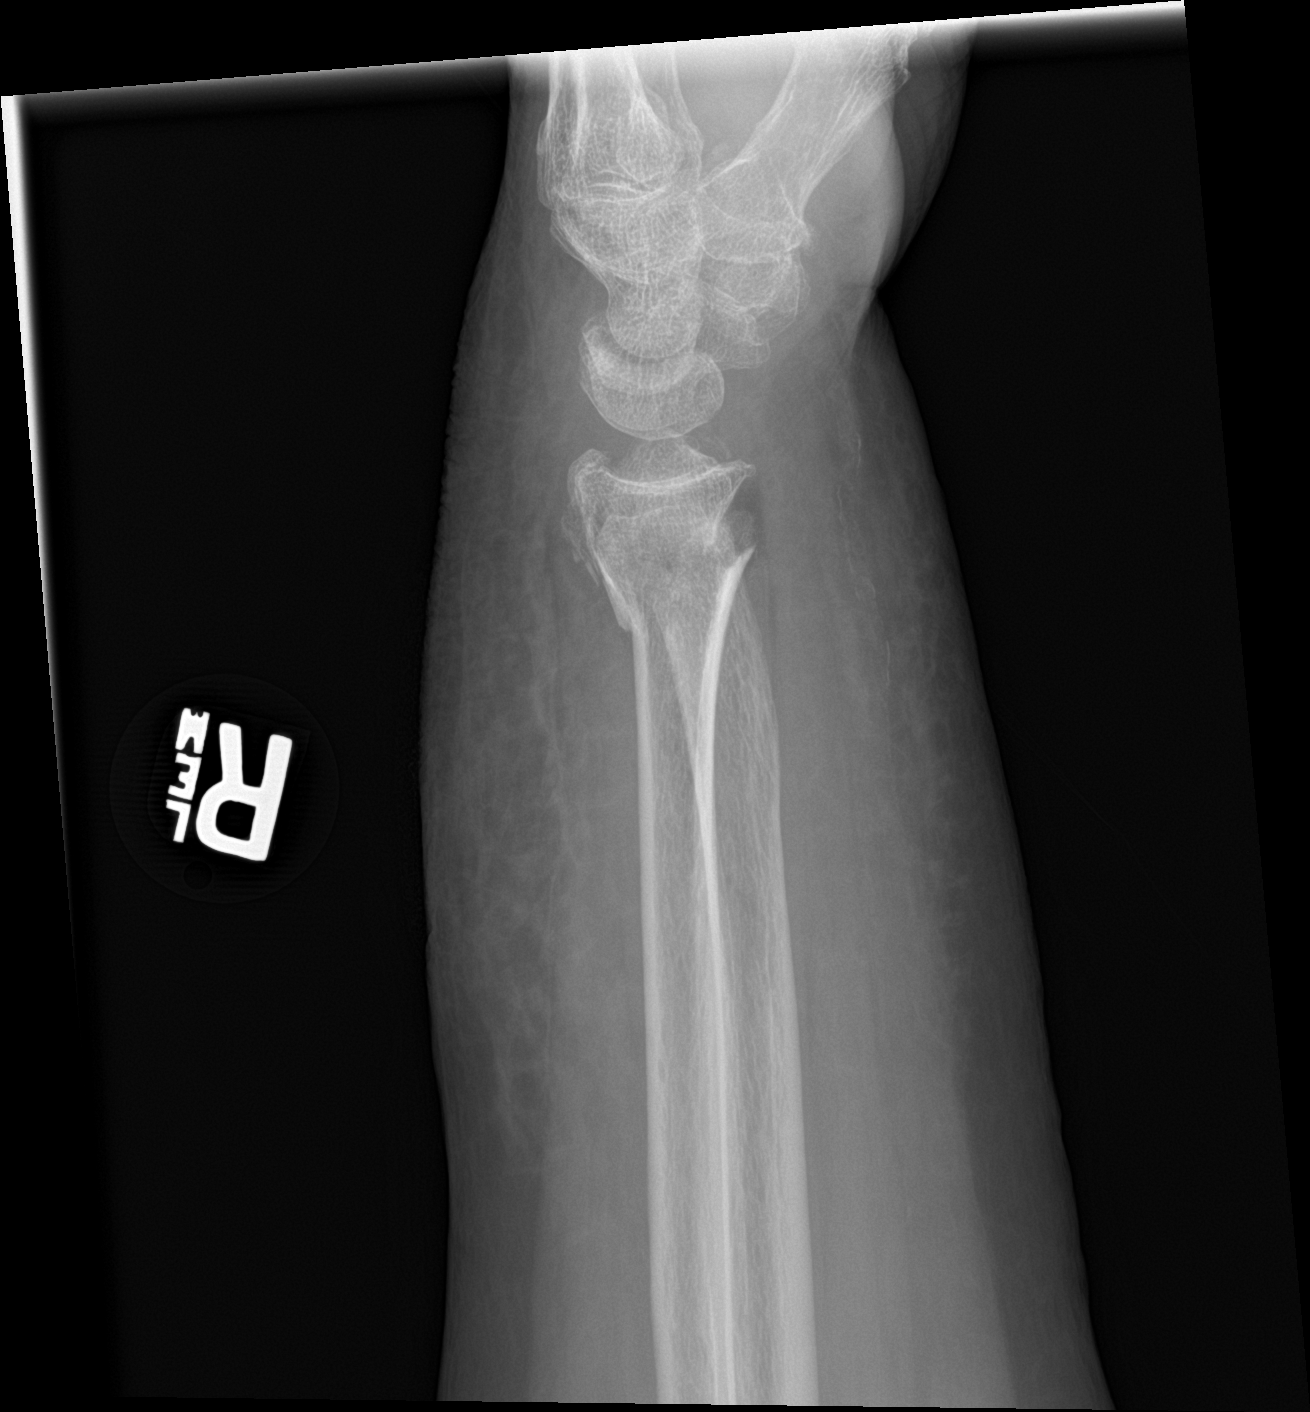

[2 of 2 positions shown; findings below may reference images not displayed]

FINDINGS: There is improved alignment of the impacted fracture of the distal
radius. There is 6 mm of residual posterior displacement and less
impaction. Fracture likely extends to the distal articular surface
which appears nondisplaced. There is a nondisplaced fracture of the
ulnar styloid. No carpal bone fractures identified. The bones are
demineralized. There is diffuse soft tissue swelling throughout the
distal forearm.
IMPRESSION: Improved alignment of the intra-articular fracture of the distal
radius as described. Nondisplaced ulnar styloid fracture.

## 2020-02-07 IMAGING — CR PORTABLE CHEST - 1 VIEW
1 series · 2 of 2 positions shown · non-contrast
Comparison: 08/19/2016

CLINICAL DATA: Mechanical fall from standing. Hit back of head.

EXAM:
PORTABLE CHEST 1 VIEW

[Series 1: dg chest port 1 view · 0.14mm/px · 2 of 2 slices shown]
[im 1/2]
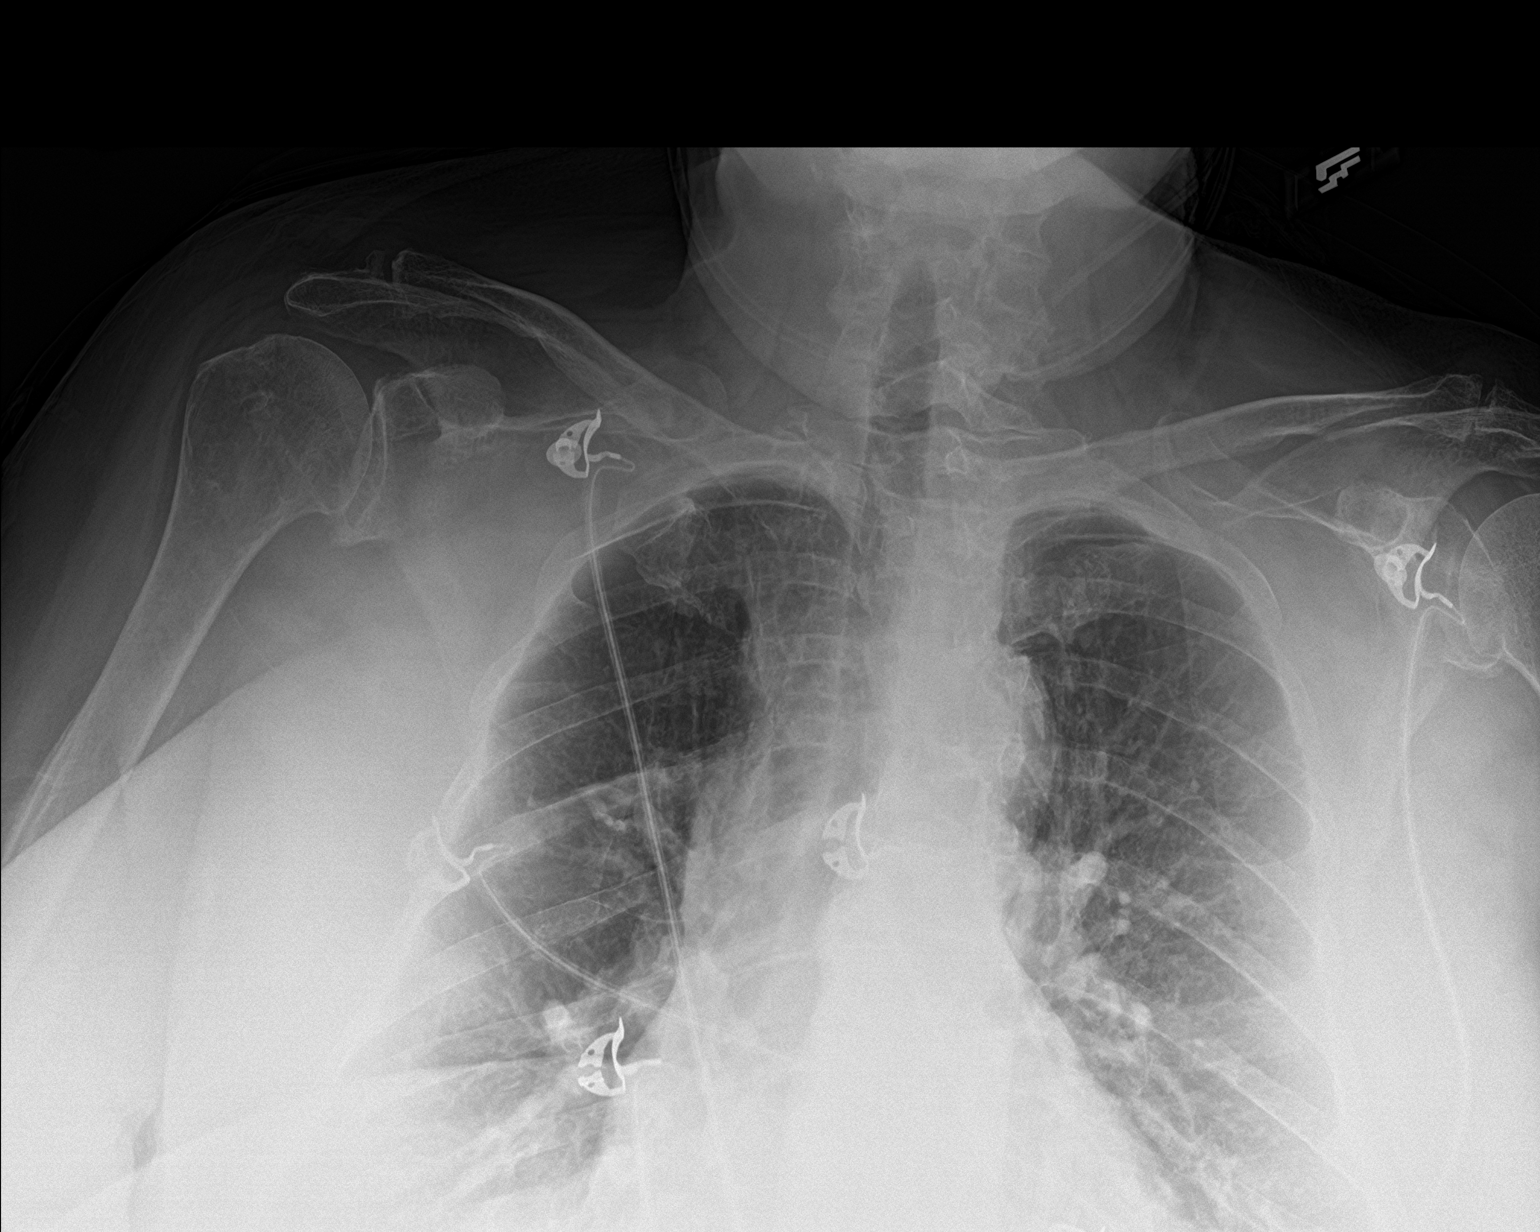
[im 2/2]
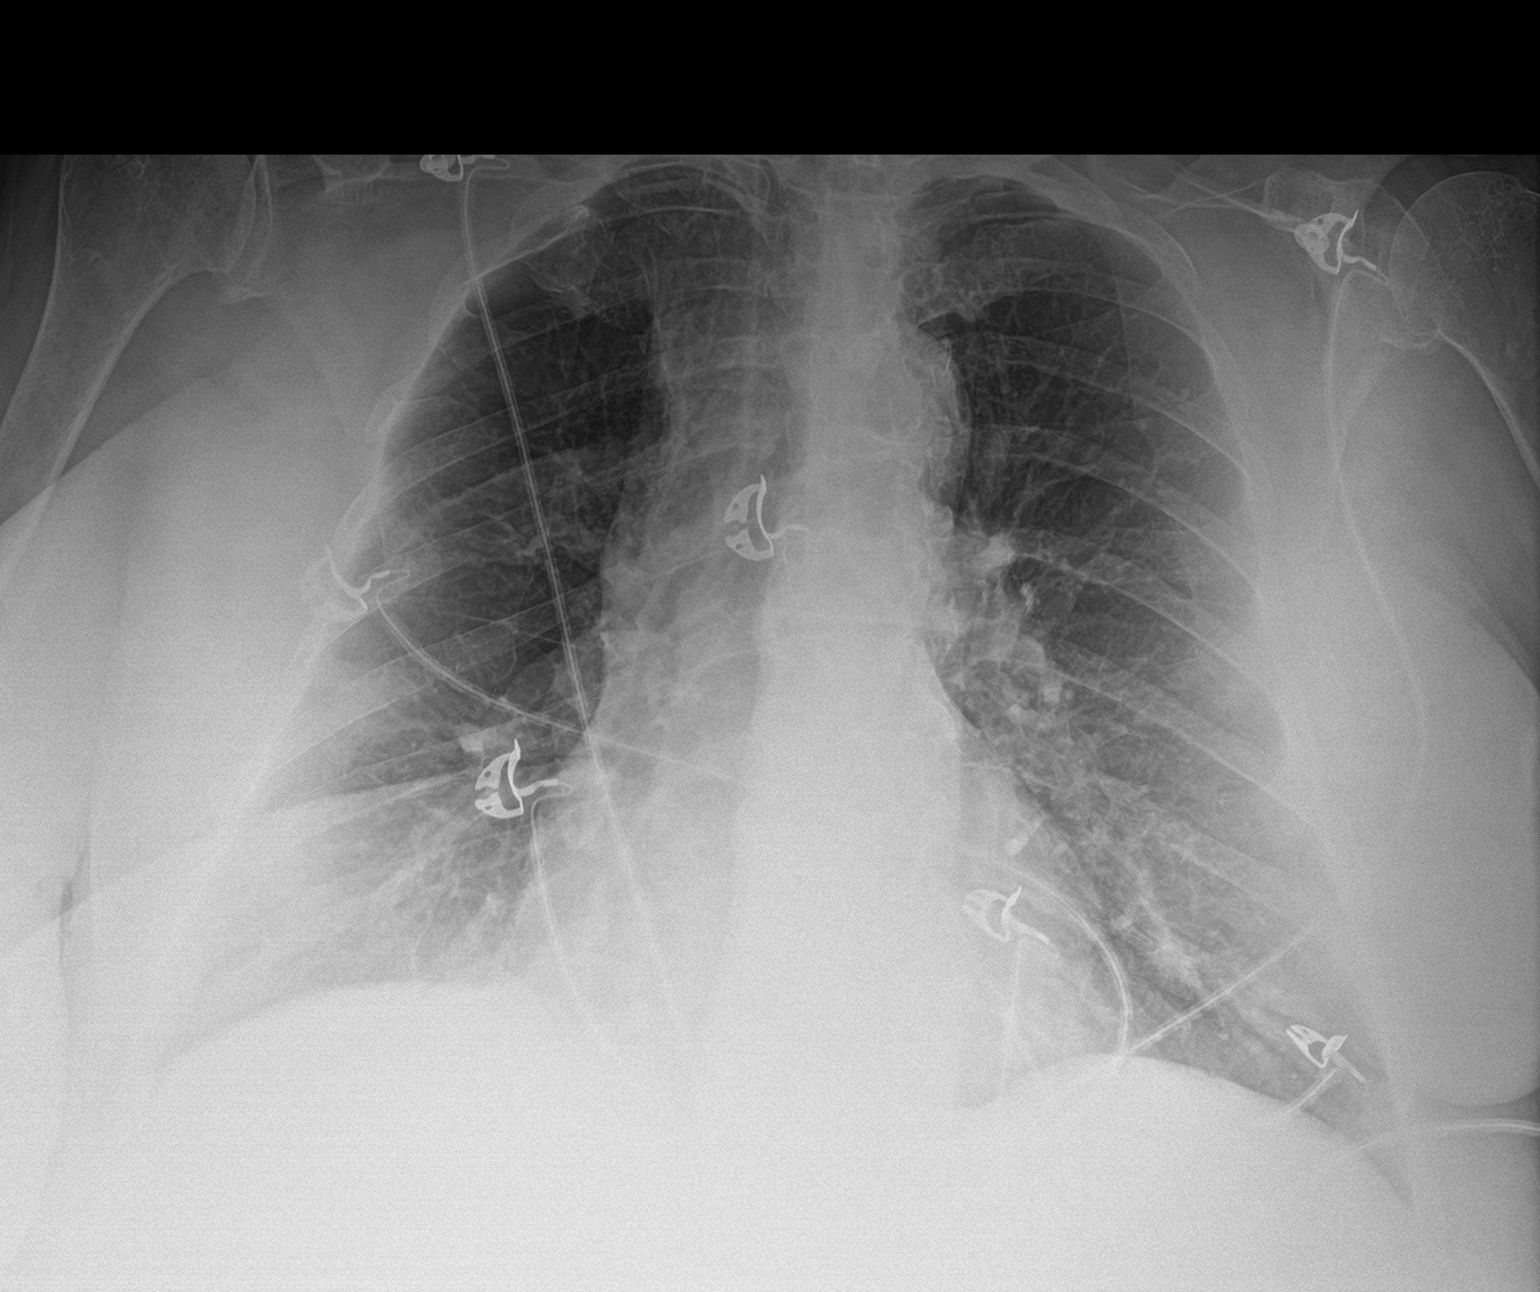

[2 of 2 positions shown; findings below may reference images not displayed]

FINDINGS: Stable and normal cardiomediastinal silhouette. Thoracic
atherosclerosis. No consolidation or edema. No osseous findings.
IMPRESSION: 1. No active disease.  Stable chest.

## 2020-02-07 IMAGING — CR RIGHT FOREARM - 2 VIEW
1 series · 2 of 2 positions shown · non-contrast
Comparison: None.

CLINICAL DATA: Right wrist pain status post fall

EXAM:
RIGHT FOREARM - 2 VIEW

[Series 1: dg forearm right · 0.14mm/px · 2 of 2 slices shown]
[im 1/2]
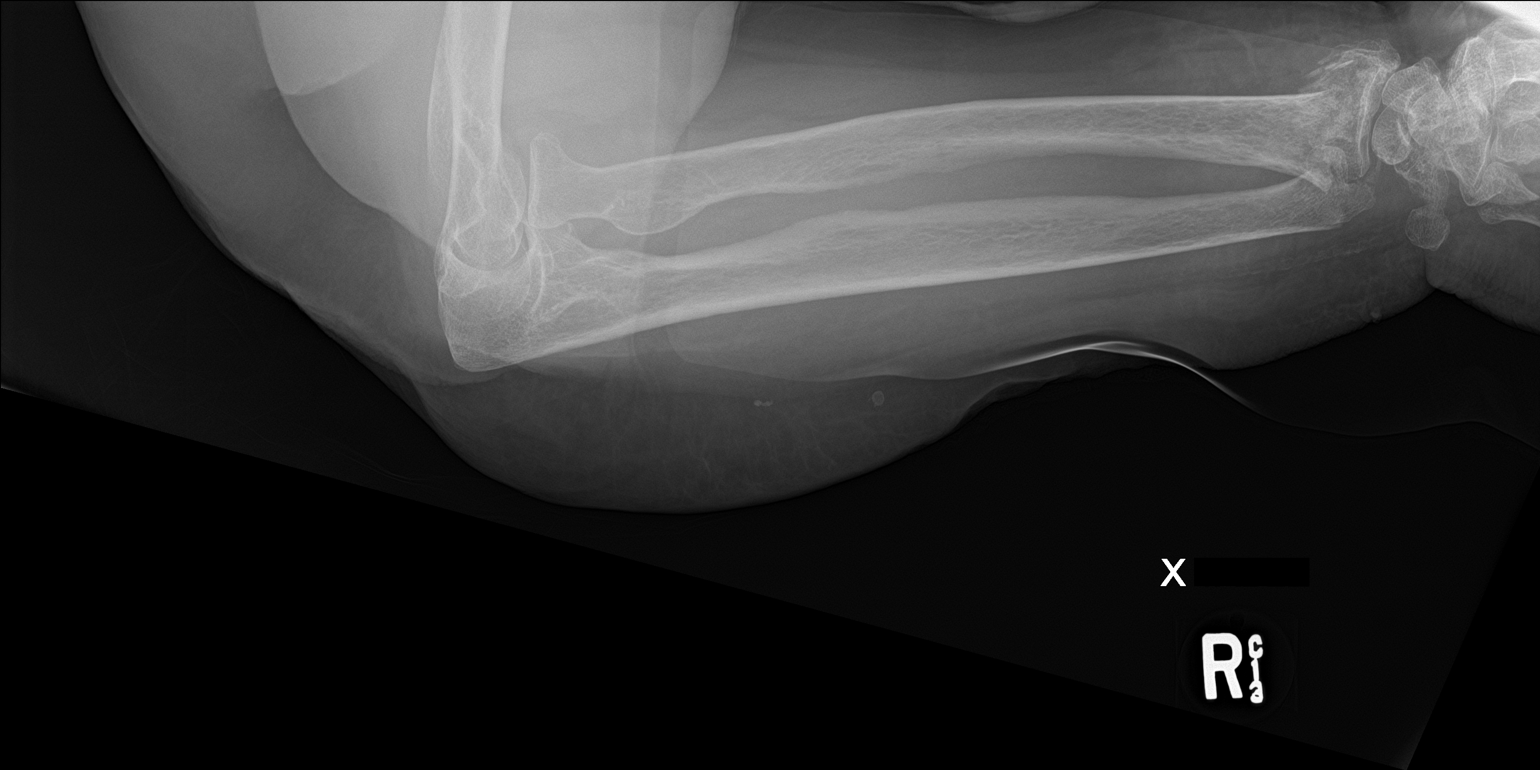
[im 2/2]
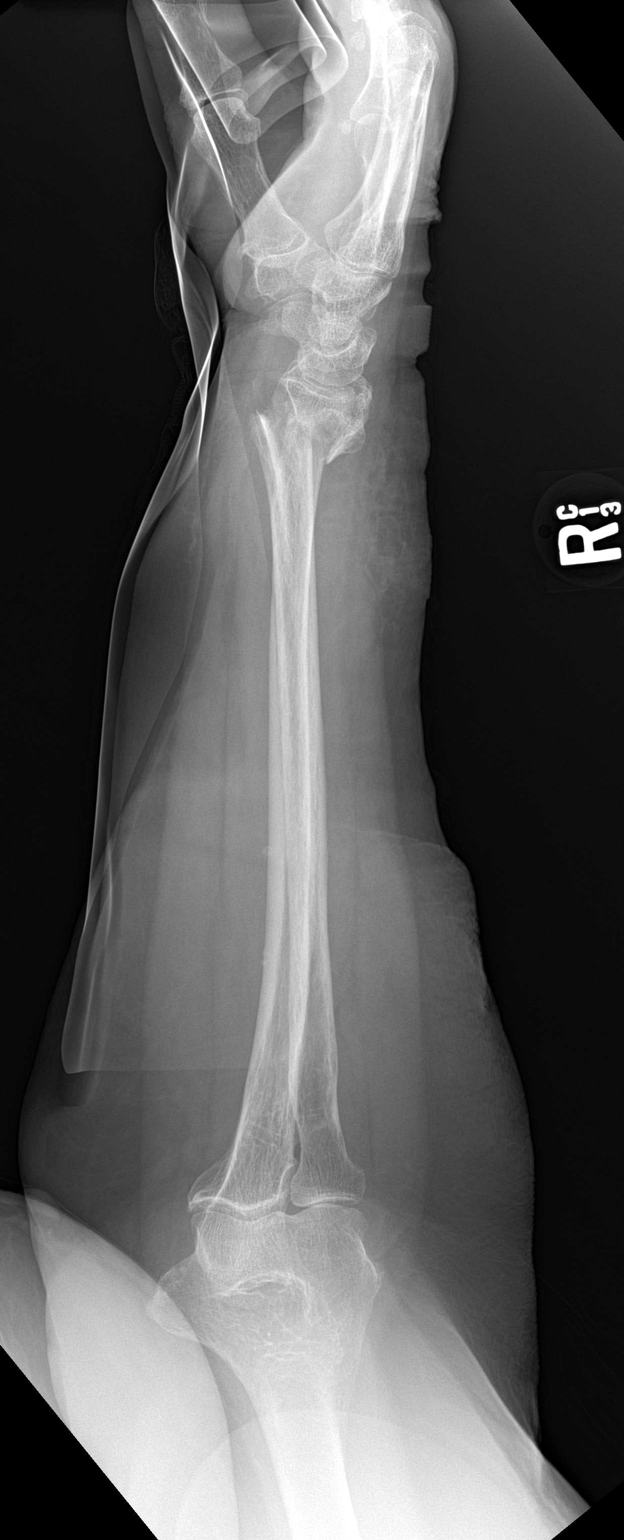

[2 of 2 positions shown; findings below may reference images not displayed]

FINDINGS: Impacted and comminuted fracture of the distal radial metaphysis
with 9 mm of dorsal displacement with articular surface involvement.

Nondisplaced fracture of the base of the ulnar styloid process.

Generalized osteopenia. No other fracture or dislocation. Soft
tissue swelling around the wrist.
IMPRESSION: Impacted and comminuted fracture of the distal radial metaphysis
with 9 mm of dorsal displacement.

Nondisplaced fracture of the base of the ulnar styloid process.

## 2020-02-07 IMAGING — CT CT HEAD WITHOUT CONTRAST
4 of 8 series · 13 of 47 positions shown, 14 images · non-contrast
Comparison: Cervical spine CT 10/28/2012

CLINICAL DATA: Fall. Hit back of head on door.

EXAM:
CT HEAD WITHOUT CONTRAST
CT CERVICAL SPINE WITHOUT CONTRAST
TECHNIQUE: Multidetector CT imaging of the head and cervical spine was
performed following the standard protocol without intravenous
contrast. Multiplanar CT image reconstructions of the cervical spine
were also generated.

[Series 2: head wo · axial · 0.47mm/px · z∈[-101,-51]mm · 2 of 32 slices shown, 3 images]
[im 11/32  brain]
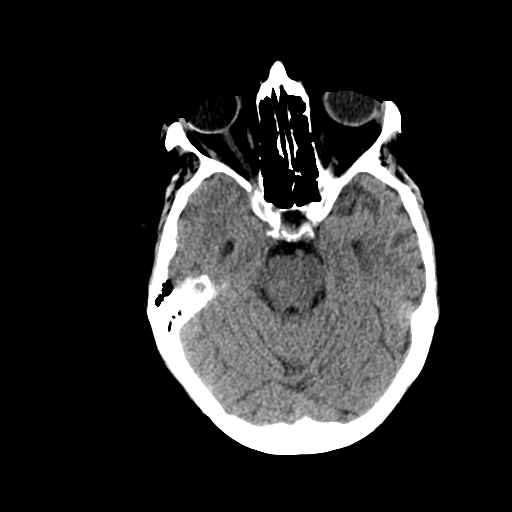
[im 11/32  bone]
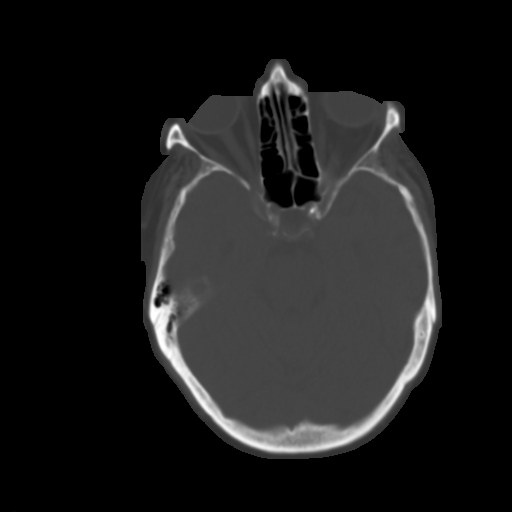
[im 21/32  brain]
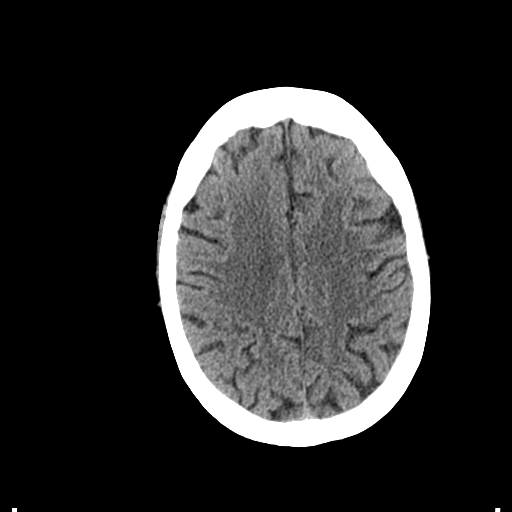

[Series 5: sagittal soft tissue · sagittal · 0.31mm/px · 1 of 51 slices shown]
[im 26/51  brain]
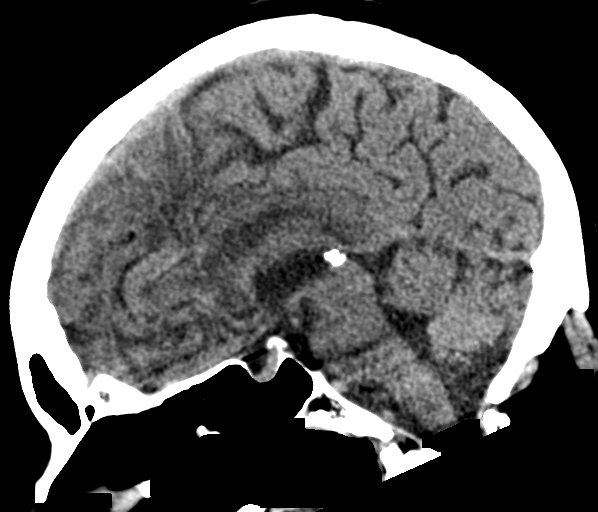

[Series 11: coronal bone · coronal · 0.20mm/px · 3 of 68 slices shown]
[im 26/68  brain]
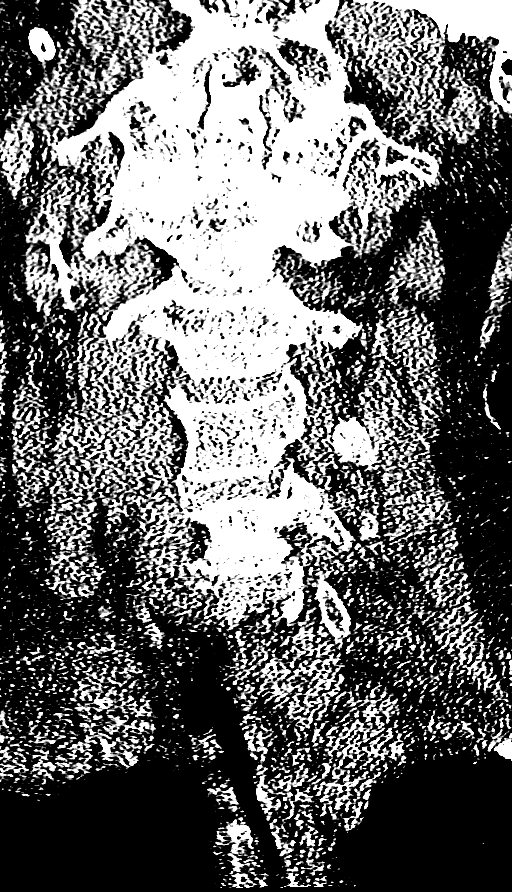
[im 34/68  brain]
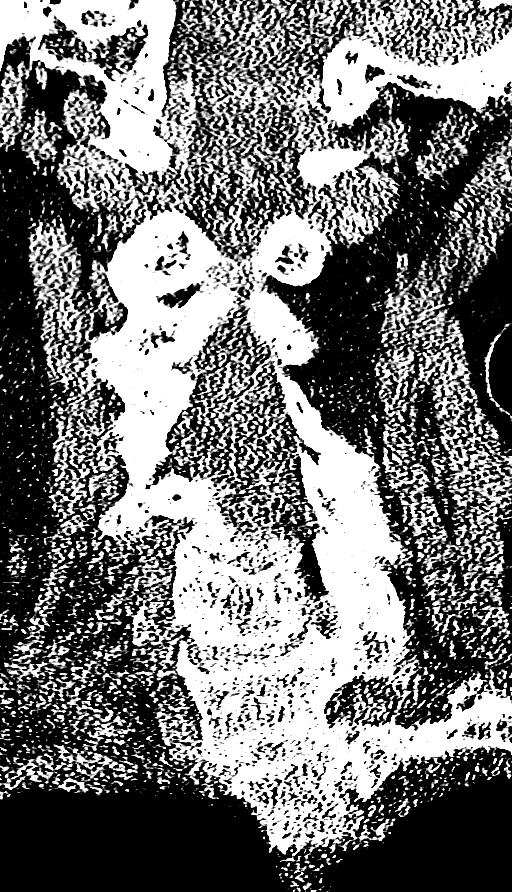
[im 42/68  brain]
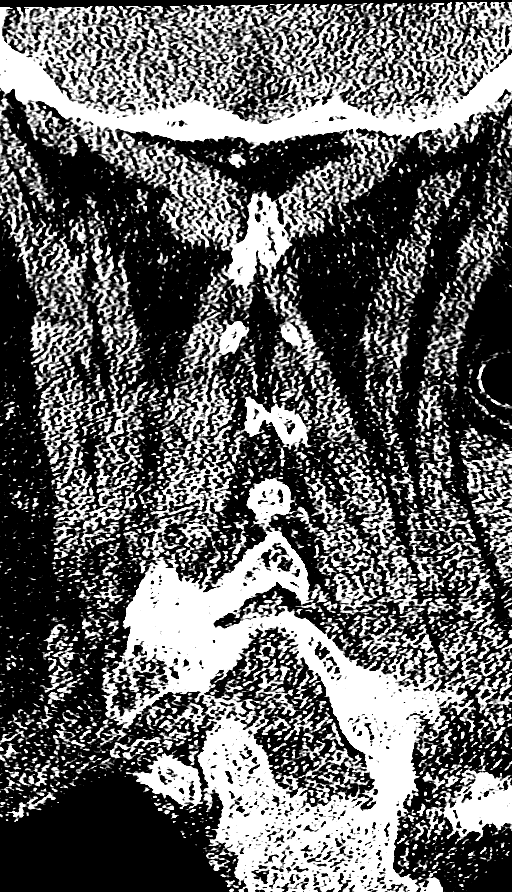

[Series 12: orthogonal bone · axial · 0.20mm/px · z∈[-298,-175]mm · 7 of 89 slices shown]
[im 12/89  bone]
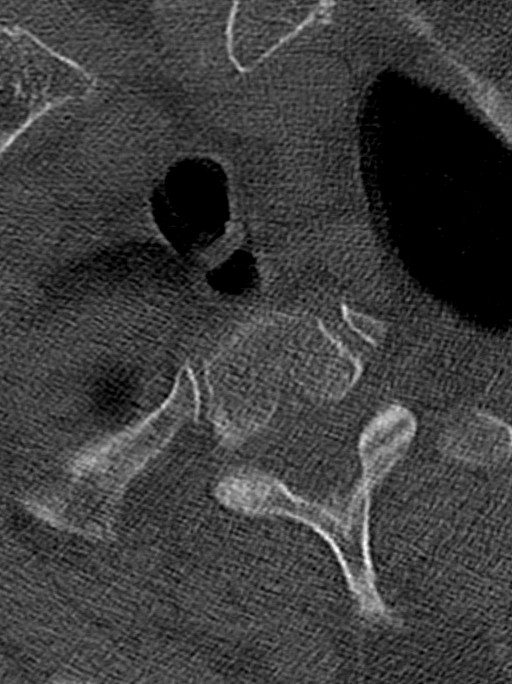
[im 23/89  bone]
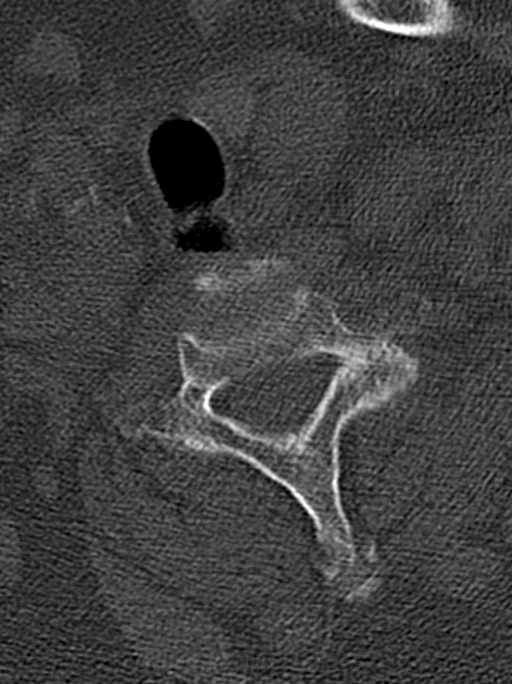
[im 34/89  bone]
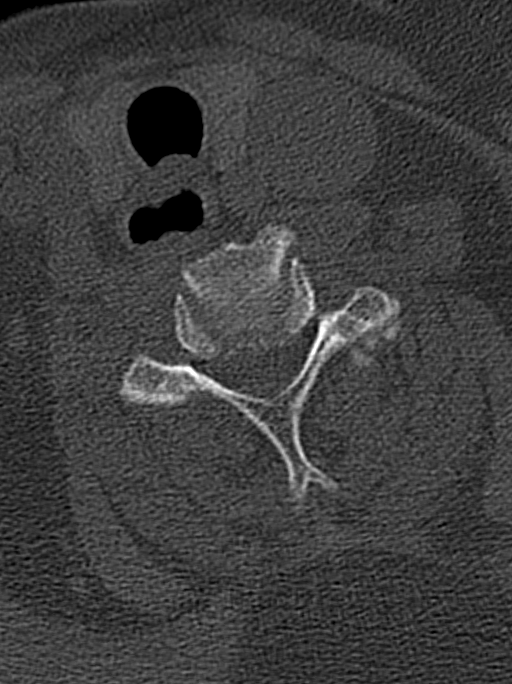
[im 45/89  bone]
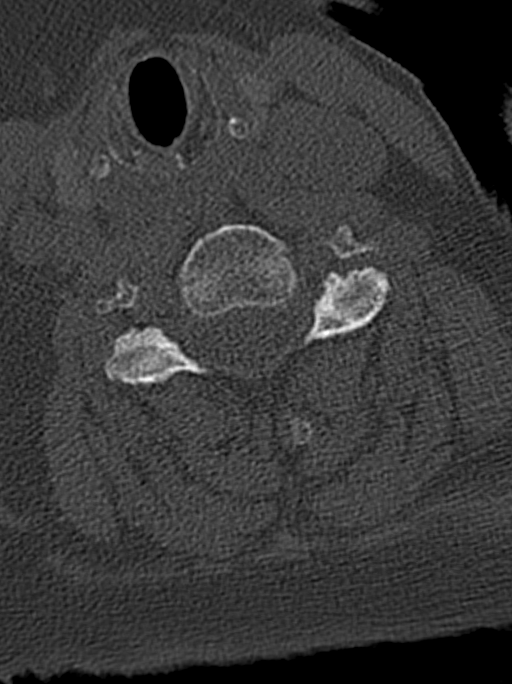
[im 56/89  bone]
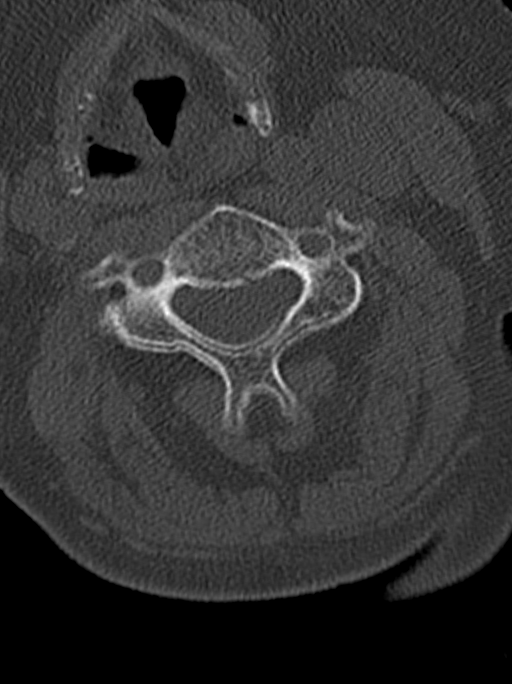
[im 67/89  bone]
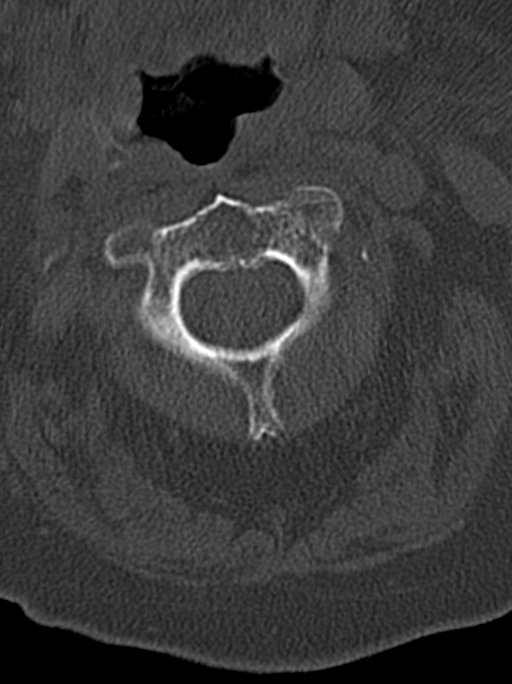
[im 78/89  bone]
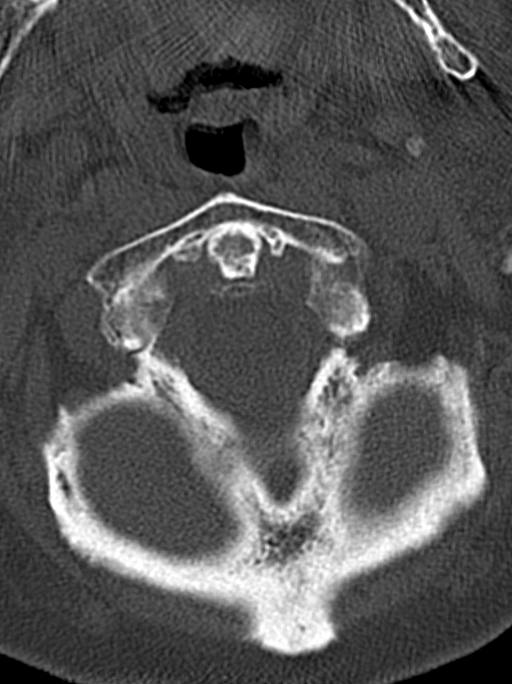

[13 of 47 positions shown; findings below may reference images not displayed]

FINDINGS: CT HEAD FINDINGS

Brain: There is no evidence of acute infarct, intracranial
hemorrhage, mass, midline shift, or extra-axial fluid collection.
Mild cerebral atrophy is not greater than expected for age. Cerebral
white matter hypodensities are nonspecific but compatible with mild
chronic small vessel ischemic disease.

Vascular: Calcified atherosclerosis at the skull base. No hyperdense
vessel.

Skull: No fracture or focal osseous lesion.

Sinuses/Orbits: Paranasal sinuses and mastoid air cells are clear.
Unremarkable orbits.

Other: None.

CT CERVICAL SPINE FINDINGS

Mild motion artifact through the upper cervical spine.

Alignment: Normal.

Skull base and vertebrae: No acute fracture or suspicious osseous
lesion. Similar appearance of prominent median C1-2 arthropathy.

Soft tissues and spinal canal: No prevertebral fluid or swelling. No
visible canal hematoma.

Disc levels: Mild cervical spondylosis. Disc bulging at C5-6
resulting in likely mild spinal stenosis.

Upper chest: Clear lung apices.

Other: Right greater than left carotid bifurcation calcific
atherosclerosis.
IMPRESSION: 1. No evidence of acute intracranial abnormality.
2. Mild chronic small vessel ischemic disease.
3. No evidence of acute fracture or traumatic subluxation in the
cervical spine.
